# Patient Record
Sex: Female | Born: 1950
Health system: Southern US, Community
[De-identification: ages and names within clinical notes are randomized; demographics above are authoritative.]

## PROBLEM LIST (undated history)

## (undated) DIAGNOSIS — I10 Essential (primary) hypertension: Secondary | ICD-10-CM

## (undated) DIAGNOSIS — C801 Malignant (primary) neoplasm, unspecified: Secondary | ICD-10-CM

## (undated) DIAGNOSIS — C50919 Malignant neoplasm of unspecified site of unspecified female breast: Secondary | ICD-10-CM

## (undated) DIAGNOSIS — R000 Tachycardia, unspecified: Secondary | ICD-10-CM

## (undated) DIAGNOSIS — Z9221 Personal history of antineoplastic chemotherapy: Secondary | ICD-10-CM

## (undated) DIAGNOSIS — I712 Thoracic aortic aneurysm, without rupture, unspecified: Secondary | ICD-10-CM

## (undated) DIAGNOSIS — Z87440 Personal history of urinary (tract) infections: Secondary | ICD-10-CM

## (undated) DIAGNOSIS — Z923 Personal history of irradiation: Secondary | ICD-10-CM

## (undated) DIAGNOSIS — K5790 Diverticulosis of intestine, part unspecified, without perforation or abscess without bleeding: Secondary | ICD-10-CM

## (undated) DIAGNOSIS — F32A Depression, unspecified: Secondary | ICD-10-CM

## (undated) DIAGNOSIS — Z860100 Personal history of colon polyps, unspecified: Secondary | ICD-10-CM

## (undated) DIAGNOSIS — T4145XA Adverse effect of unspecified anesthetic, initial encounter: Secondary | ICD-10-CM

## (undated) DIAGNOSIS — F329 Major depressive disorder, single episode, unspecified: Secondary | ICD-10-CM

## (undated) DIAGNOSIS — M810 Age-related osteoporosis without current pathological fracture: Secondary | ICD-10-CM

## (undated) DIAGNOSIS — I499 Cardiac arrhythmia, unspecified: Secondary | ICD-10-CM

## (undated) DIAGNOSIS — T8859XA Other complications of anesthesia, initial encounter: Secondary | ICD-10-CM

## (undated) DIAGNOSIS — E785 Hyperlipidemia, unspecified: Secondary | ICD-10-CM

## (undated) HISTORY — PX: AUGMENTATION MAMMAPLASTY: SUR837

## (undated) HISTORY — PX: MASTECTOMY: SHX3

## (undated) HISTORY — DX: Malignant neoplasm of unspecified site of unspecified female breast: C50.919

## (undated) HISTORY — PX: OTHER SURGICAL HISTORY: SHX169

## (undated) HISTORY — DX: Malignant (primary) neoplasm, unspecified: C80.1

## (undated) HISTORY — PX: COLONOSCOPY: SHX174

---

## 1991-05-03 DIAGNOSIS — C801 Malignant (primary) neoplasm, unspecified: Secondary | ICD-10-CM

## 1991-05-03 HISTORY — DX: Malignant (primary) neoplasm, unspecified: C80.1

## 1991-12-10 DIAGNOSIS — Z853 Personal history of malignant neoplasm of breast: Secondary | ICD-10-CM | POA: Insufficient documentation

## 1991-12-10 HISTORY — PX: BREAST SURGERY: SHX581

## 1997-09-16 ENCOUNTER — Other Ambulatory Visit: Admission: RE | Admit: 1997-09-16 | Discharge: 1997-09-16 | Payer: Self-pay | Admitting: Gynecology

## 1998-10-28 ENCOUNTER — Other Ambulatory Visit: Admission: RE | Admit: 1998-10-28 | Discharge: 1998-10-28 | Payer: Self-pay | Admitting: Gynecology

## 1999-08-11 ENCOUNTER — Encounter: Admission: RE | Admit: 1999-08-11 | Discharge: 1999-08-11 | Payer: Self-pay | Admitting: Oncology

## 1999-08-11 ENCOUNTER — Encounter: Payer: Self-pay | Admitting: Oncology

## 1999-11-22 ENCOUNTER — Other Ambulatory Visit: Admission: RE | Admit: 1999-11-22 | Discharge: 1999-11-22 | Payer: Self-pay | Admitting: Gynecology

## 2000-10-20 ENCOUNTER — Encounter: Admission: RE | Admit: 2000-10-20 | Discharge: 2000-10-20 | Payer: Self-pay | Admitting: Gynecology

## 2000-10-20 ENCOUNTER — Encounter: Payer: Self-pay | Admitting: Oncology

## 2000-12-05 ENCOUNTER — Other Ambulatory Visit: Admission: RE | Admit: 2000-12-05 | Discharge: 2000-12-05 | Payer: Self-pay | Admitting: Gynecology

## 2001-05-16 ENCOUNTER — Encounter: Admission: RE | Admit: 2001-05-16 | Discharge: 2001-08-14 | Payer: Self-pay | Admitting: *Deleted

## 2001-12-05 ENCOUNTER — Other Ambulatory Visit: Admission: RE | Admit: 2001-12-05 | Discharge: 2001-12-05 | Payer: Self-pay | Admitting: Gynecology

## 2001-12-10 ENCOUNTER — Encounter: Admission: RE | Admit: 2001-12-10 | Discharge: 2001-12-10 | Payer: Self-pay | Admitting: Oncology

## 2001-12-10 ENCOUNTER — Encounter: Payer: Self-pay | Admitting: Oncology

## 2002-12-12 ENCOUNTER — Encounter: Admission: RE | Admit: 2002-12-12 | Discharge: 2002-12-12 | Payer: Self-pay | Admitting: Oncology

## 2002-12-12 ENCOUNTER — Encounter: Payer: Self-pay | Admitting: Oncology

## 2003-03-05 ENCOUNTER — Other Ambulatory Visit: Admission: RE | Admit: 2003-03-05 | Discharge: 2003-03-05 | Payer: Self-pay | Admitting: Gynecology

## 2004-01-26 ENCOUNTER — Encounter: Admission: RE | Admit: 2004-01-26 | Discharge: 2004-01-26 | Payer: Self-pay | Admitting: Oncology

## 2004-08-17 ENCOUNTER — Other Ambulatory Visit: Admission: RE | Admit: 2004-08-17 | Discharge: 2004-08-17 | Payer: Self-pay | Admitting: Gynecology

## 2005-02-18 ENCOUNTER — Encounter: Admission: RE | Admit: 2005-02-18 | Discharge: 2005-02-18 | Payer: Self-pay | Admitting: Oncology

## 2005-10-10 ENCOUNTER — Other Ambulatory Visit: Admission: RE | Admit: 2005-10-10 | Discharge: 2005-10-10 | Payer: Self-pay | Admitting: Gynecology

## 2006-02-24 ENCOUNTER — Encounter: Admission: RE | Admit: 2006-02-24 | Discharge: 2006-02-24 | Payer: Self-pay | Admitting: Oncology

## 2006-11-15 ENCOUNTER — Other Ambulatory Visit: Admission: RE | Admit: 2006-11-15 | Discharge: 2006-11-15 | Payer: Self-pay | Admitting: Gynecology

## 2007-03-01 ENCOUNTER — Encounter: Admission: RE | Admit: 2007-03-01 | Discharge: 2007-03-01 | Payer: Self-pay | Admitting: Oncology

## 2008-03-03 ENCOUNTER — Encounter: Admission: RE | Admit: 2008-03-03 | Discharge: 2008-03-03 | Payer: Self-pay | Admitting: Oncology

## 2008-03-31 ENCOUNTER — Other Ambulatory Visit: Admission: RE | Admit: 2008-03-31 | Discharge: 2008-03-31 | Payer: Self-pay | Admitting: Gynecology

## 2008-09-15 ENCOUNTER — Ambulatory Visit: Payer: Self-pay | Admitting: Oncology

## 2010-05-23 ENCOUNTER — Encounter: Payer: Self-pay | Admitting: Gynecology

## 2010-10-20 ENCOUNTER — Other Ambulatory Visit: Payer: Self-pay | Admitting: Family Medicine

## 2010-10-20 DIAGNOSIS — S2220XA Unspecified fracture of sternum, initial encounter for closed fracture: Secondary | ICD-10-CM

## 2010-10-20 DIAGNOSIS — I712 Thoracic aortic aneurysm, without rupture, unspecified: Secondary | ICD-10-CM

## 2010-12-21 ENCOUNTER — Ambulatory Visit
Admission: RE | Admit: 2010-12-21 | Discharge: 2010-12-21 | Disposition: A | Discharge status: Home / Self Care | Payer: BC Managed Care – PPO | Source: Ambulatory Visit | Attending: Family Medicine | Admitting: Family Medicine

## 2010-12-21 DIAGNOSIS — I712 Thoracic aortic aneurysm, without rupture, unspecified: Secondary | ICD-10-CM

## 2010-12-21 DIAGNOSIS — S2220XA Unspecified fracture of sternum, initial encounter for closed fracture: Secondary | ICD-10-CM

## 2010-12-21 MED ORDER — IOHEXOL 300 MG/ML  SOLN: 75.0000 mL | Freq: Once | INTRAMUSCULAR | Status: AC | PRN

## 2010-12-22 ENCOUNTER — Other Ambulatory Visit: Payer: Self-pay

## 2012-01-09 ENCOUNTER — Other Ambulatory Visit: Payer: Self-pay | Admitting: Gynecology

## 2012-01-09 ENCOUNTER — Ambulatory Visit
Admission: RE | Admit: 2012-01-09 | Discharge: 2012-01-09 | Disposition: A | Discharge status: Home / Self Care | Payer: BC Managed Care – PPO | Source: Ambulatory Visit | Attending: Gynecology | Admitting: Gynecology

## 2012-01-09 DIAGNOSIS — N6453 Retraction of nipple: Secondary | ICD-10-CM

## 2012-01-09 DIAGNOSIS — N649 Disorder of breast, unspecified: Secondary | ICD-10-CM

## 2012-01-27 ENCOUNTER — Encounter (INDEPENDENT_AMBULATORY_CARE_PROVIDER_SITE_OTHER): Payer: Self-pay | Admitting: Surgery

## 2012-01-27 ENCOUNTER — Ambulatory Visit (INDEPENDENT_AMBULATORY_CARE_PROVIDER_SITE_OTHER): Payer: BC Managed Care – PPO | Admitting: Surgery

## 2012-01-27 VITALS — BP 88/62 | HR 64 | Temp 98.6°F | Resp 12 | Ht 67.0 in | Wt 120.8 lb

## 2012-01-27 DIAGNOSIS — N6489 Other specified disorders of breast: Secondary | ICD-10-CM | POA: Insufficient documentation

## 2012-01-27 DIAGNOSIS — C50919 Malignant neoplasm of unspecified site of unspecified female breast: Secondary | ICD-10-CM

## 2012-01-27 DIAGNOSIS — Z853 Personal history of malignant neoplasm of breast: Secondary | ICD-10-CM

## 2012-01-27 NOTE — Patient Instructions (Signed)
I will call you next week when we have a report back from the pathologist

## 2012-01-27 NOTE — Progress Notes (Signed)
NAME: Christina Daniel DOB: 12/22/1950 MRN: [id number]                                                                                      DATE: 01/27/2012  PCP: NNODI, ADAKU, MD Referring Provider: Hu, Jeffrey T, MD  IMPRESSION:  Nipple crusting and scar nodule X3 at site of prior lumpectomy for DCIS/LCIS  PLAN:   I think both areas need Bx with punch. Discussed with patient and she wishes to proceed.  PROCEDURE NOTE: The right nipple area in the more medial of 3 skin nodules are prepped with alcohol and anesthetized with 1% Xylocaine with epinephrine. A 3 mm punch biopsy is taken from the both sites.                 CC:  Chief Complaint  Patient presents with  . Breast Problem    HPI:  Christina Daniel is a 61 y.o.  female who presents for evaluation of a change in the right nipple and some nodules on a prior lumpectomy scar. She thinks the nipple has become somewhat inverted and crusting over the last several months. The nodules she noted about a year ago and they may have enlarged a bit. In 1993 she had a lumpectomy for a DCIS in the right breast. LCIS was also found. She had postoperative radiation. Apparently she did not have any other treatments. I have seen her for some benign problems of the left breast since then but there has been no further evidence of cancer. These areas are not symptomatic.  PMH:  has a past medical history of Cancer (1993).  PSH:   has past surgical history that includes Breast surgery (12/10/1991).  ALLERGIES:   Allergies  Allergen Reactions  . Sulfa Antibiotics     MEDICATIONS: Current outpatient prescriptions:alendronate (FOSAMAX) 70 MG tablet, , Disp: , Rfl: ;  hydrochlorothiazide (HYDRODIURIL) 25 MG tablet, , Disp: , Rfl: ;  HYDROcodone-acetaminophen (VICODIN) 5-500 MG per tablet, , Disp: , Rfl: ;  metoprolol succinate (TOPROL-XL) 25 MG 24 hr tablet, , Disp: , Rfl: ;  promethazine (PHENERGAN) 25 MG tablet, , Disp: , Rfl: ;  simvastatin (ZOCOR) 20  MG tablet, , Disp: , Rfl:   ROS: She has filled out our 12 point review of systems and it is negative . EXAM:   General: Patient alert, NAD VS: BP 88/62  Pulse 64  Temp 98.6 F (37 C) (Temporal)  Resp 12  Ht 5' 7" (1.702 m)  Wt 120 lb 12.8 oz (54.795 kg)  BMI 18.92 kg/m2 Breasts; The right breast shows a flattened nipple with crusting. There are three red raised 2-3mm nodules along the old scar  DATA REVIEWED:  Reviewed old chart.    Xara Paulding J 01/27/2012  CC: Hu, Jeffrey T, MD, NNODI, ADAKU, MD        

## 2012-01-30 ENCOUNTER — Other Ambulatory Visit: Payer: Self-pay | Admitting: Oncology

## 2012-02-01 ENCOUNTER — Telehealth (INDEPENDENT_AMBULATORY_CARE_PROVIDER_SITE_OTHER): Payer: Self-pay | Admitting: General Surgery

## 2012-02-01 ENCOUNTER — Ambulatory Visit (INDEPENDENT_AMBULATORY_CARE_PROVIDER_SITE_OTHER): Payer: BC Managed Care – PPO | Admitting: Surgery

## 2012-02-01 ENCOUNTER — Encounter (INDEPENDENT_AMBULATORY_CARE_PROVIDER_SITE_OTHER): Payer: Self-pay | Admitting: Surgery

## 2012-02-01 ENCOUNTER — Encounter (INDEPENDENT_AMBULATORY_CARE_PROVIDER_SITE_OTHER): Payer: Self-pay

## 2012-02-01 VITALS — BP 144/100 | HR 80 | Temp 98.5°F | Resp 16 | Ht 67.0 in | Wt 122.2 lb

## 2012-02-01 DIAGNOSIS — C50911 Malignant neoplasm of unspecified site of right female breast: Secondary | ICD-10-CM

## 2012-02-01 DIAGNOSIS — C50919 Malignant neoplasm of unspecified site of unspecified female breast: Secondary | ICD-10-CM

## 2012-02-01 NOTE — Telephone Encounter (Signed)
Spoke with pt and explained that we referred her to Dr. Shella Spearing.  I gave her the address and the appt information again.  I explained to her that Dr. Kelly Splinter will be able to go over more information about the reconstruction possibilities if she decides this is what she would like to do.. I also informed her that We will give her a call back once we get her MRI scheduled.

## 2012-02-01 NOTE — Telephone Encounter (Signed)
Message copied by Littie Deeds on Wed Feb 01, 2012  5:30 PM ------      Message from: Zacarias Pontes      Created: Wed Feb 01, 2012  3:28 PM       Pt was in today and referred to another doc?? She doesn't remember who she is suppose to see,please give her a call back at [phone]
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
## 2012-02-01 NOTE — Patient Instructions (Signed)
We will arrange an MRI scan of your breast, an appointment with a genetic counselor, and an appointment with a plastic surgeon. I will check with Dr Darnelle Catalan about doing some other scans

## 2012-02-06 ENCOUNTER — Telehealth (INDEPENDENT_AMBULATORY_CARE_PROVIDER_SITE_OTHER): Payer: Self-pay | Admitting: General Surgery

## 2012-02-06 NOTE — Telephone Encounter (Signed)
Returned patient's call. Left message on machine for patient to call back and ask for me.

## 2012-02-06 NOTE — Telephone Encounter (Signed)
Message copied by Liliana Cline on Mon Feb 06, 2012 12:47 PM ------      Message from: Marchia Bond      Created: Mon Feb 06, 2012 10:50 AM      Regarding: PT MRI      Contact: [phone]       DR [facility] PT CALLED AND WOULD LIKE TO MOVE HER MRI TO Monday IF ITS OK WITH DR Jamey Ripa CALL PT BACK AT [phone]

## 2012-02-06 NOTE — Telephone Encounter (Signed)
Spoke to patient. She wanted to know if okay to wait until next Monday 02/13/12. I advised if that works better with her schedule it is okay to wait the few extra days. She will call to reschedule.

## 2012-02-07 ENCOUNTER — Telehealth: Payer: Self-pay | Admitting: *Deleted

## 2012-02-07 NOTE — Telephone Encounter (Signed)
Patient returned my call and I tried to give her a genetic appt of 10/10 and she cannot do that date.  Told patient that my next available rt now is Nov. 25th and that just wont do.  Gave information to Tami to help me with this.

## 2012-02-07 NOTE — Telephone Encounter (Signed)
Left message for pt to return my call so I can schedule a genetic appt.  

## 2012-02-08 ENCOUNTER — Other Ambulatory Visit: Payer: BC Managed Care – PPO

## 2012-02-08 ENCOUNTER — Telehealth: Payer: Self-pay | Admitting: *Deleted

## 2012-02-08 NOTE — Telephone Encounter (Signed)
Patient called stating that she would like to go ahead and schedule her genetic appt for Nov.  Confirmed 03/26/12 genetic appt w/ pt.  Emailed Jade to make aware.

## 2012-02-09 ENCOUNTER — Other Ambulatory Visit: Payer: Self-pay | Admitting: *Deleted

## 2012-02-09 ENCOUNTER — Telehealth: Payer: Self-pay | Admitting: *Deleted

## 2012-02-09 ENCOUNTER — Telehealth: Payer: Self-pay | Admitting: Oncology

## 2012-02-09 DIAGNOSIS — C50919 Malignant neoplasm of unspecified site of unspecified female breast: Secondary | ICD-10-CM

## 2012-02-09 NOTE — Telephone Encounter (Signed)
lmonvm adviisng the pt of her petscan/ct scan appt with instructions

## 2012-02-09 NOTE — Telephone Encounter (Signed)
Pt returned my call and I confirmed 02/20/12 appt w/ her.  Mailed before appt letter & packet to pt.  Emailed Jade & Dr. Jamey Ripa at [facility] to make them aware of appt.  Took paperwork to Med Rec for chart.

## 2012-02-10 ENCOUNTER — Telehealth (INDEPENDENT_AMBULATORY_CARE_PROVIDER_SITE_OTHER): Payer: Self-pay

## 2012-02-10 ENCOUNTER — Other Ambulatory Visit (INDEPENDENT_AMBULATORY_CARE_PROVIDER_SITE_OTHER): Payer: Self-pay | Admitting: Surgery

## 2012-02-10 DIAGNOSIS — C50911 Malignant neoplasm of unspecified site of right female breast: Secondary | ICD-10-CM

## 2012-02-10 NOTE — Telephone Encounter (Signed)
The patient called to discuss her appointments for next week.  She has a mri of the breasts Monday and ct/pet Friday per Dr Darnelle Catalan.  She is unclear why she needs both.  I told her the mri is of the breasts only and the ct/ pet appears to be of the body.  She said Dr Darnelle Catalan said the ct was of the chest.  I told her she may need to clarify that with his office.  She wants to know why she can't just have a mri of the body so she can do just one scan.  I told her I will need to forward this to Dr Tenna Child nurse to discuss.

## 2012-02-10 NOTE — Telephone Encounter (Signed)
LMOM for Marianne Sofia, RN with cancer center to call me re: this.

## 2012-02-13 ENCOUNTER — Ambulatory Visit
Admission: RE | Admit: 2012-02-13 | Discharge: 2012-02-13 | Disposition: A | Discharge status: Home / Self Care | Payer: BC Managed Care – PPO | Source: Ambulatory Visit | Attending: Surgery | Admitting: Surgery

## 2012-02-13 DIAGNOSIS — C50919 Malignant neoplasm of unspecified site of unspecified female breast: Secondary | ICD-10-CM

## 2012-02-13 MED ORDER — GADOBENATE DIMEGLUMINE 529 MG/ML IV SOLN
11.0000 mL | Freq: Once | INTRAVENOUS | Status: AC | PRN
  Administered 2012-02-13: 11 mL via INTRAVENOUS

## 2012-02-14 ENCOUNTER — Telehealth (INDEPENDENT_AMBULATORY_CARE_PROVIDER_SITE_OTHER): Payer: Self-pay | Admitting: Surgery

## 2012-02-14 NOTE — Telephone Encounter (Signed)
Reviewed her MRI report and discussed plans. PET and oncology visit both pending

## 2012-02-16 ENCOUNTER — Encounter (HOSPITAL_COMMUNITY): Payer: Self-pay | Admitting: Pharmacy Technician

## 2012-02-17 ENCOUNTER — Other Ambulatory Visit: Payer: Self-pay | Admitting: *Deleted

## 2012-02-17 ENCOUNTER — Encounter (HOSPITAL_COMMUNITY)
Admission: RE | Admit: 2012-02-17 | Discharge: 2012-02-17 | Disposition: A | Discharge status: Home / Self Care | Payer: BC Managed Care – PPO | Source: Ambulatory Visit | Attending: Surgery | Admitting: Surgery

## 2012-02-17 ENCOUNTER — Ambulatory Visit (HOSPITAL_COMMUNITY)
Admission: RE | Admit: 2012-02-17 | Discharge: 2012-02-17 | Disposition: A | Discharge status: Home / Self Care | Payer: BC Managed Care – PPO | Source: Ambulatory Visit | Attending: Oncology | Admitting: Oncology

## 2012-02-17 ENCOUNTER — Encounter (HOSPITAL_COMMUNITY): Payer: Self-pay

## 2012-02-17 DIAGNOSIS — Z01818 Encounter for other preprocedural examination: Secondary | ICD-10-CM | POA: Insufficient documentation

## 2012-02-17 DIAGNOSIS — Z01812 Encounter for preprocedural laboratory examination: Secondary | ICD-10-CM | POA: Insufficient documentation

## 2012-02-17 DIAGNOSIS — Z1289 Encounter for screening for malignant neoplasm of other sites: Secondary | ICD-10-CM | POA: Insufficient documentation

## 2012-02-17 DIAGNOSIS — C50919 Malignant neoplasm of unspecified site of unspecified female breast: Secondary | ICD-10-CM

## 2012-02-17 DIAGNOSIS — I10 Essential (primary) hypertension: Secondary | ICD-10-CM | POA: Insufficient documentation

## 2012-02-17 DIAGNOSIS — Z01810 Encounter for preprocedural cardiovascular examination: Secondary | ICD-10-CM | POA: Insufficient documentation

## 2012-02-17 HISTORY — DX: Diverticulosis of intestine, part unspecified, without perforation or abscess without bleeding: K57.90

## 2012-02-17 HISTORY — DX: Tachycardia, unspecified: R00.0

## 2012-02-17 HISTORY — DX: Personal history of colonic polyps: Z86.010

## 2012-02-17 HISTORY — DX: Personal history of urinary (tract) infections: Z87.440

## 2012-02-17 HISTORY — DX: Major depressive disorder, single episode, unspecified: F32.9

## 2012-02-17 HISTORY — DX: Essential (primary) hypertension: I10

## 2012-02-17 HISTORY — DX: Adverse effect of unspecified anesthetic, initial encounter: T41.45XA

## 2012-02-17 HISTORY — DX: Personal history of colon polyps, unspecified: Z86.0100

## 2012-02-17 HISTORY — DX: Hyperlipidemia, unspecified: E78.5

## 2012-02-17 HISTORY — DX: Age-related osteoporosis without current pathological fracture: M81.0

## 2012-02-17 HISTORY — DX: Thoracic aortic aneurysm, without rupture: I71.2

## 2012-02-17 HISTORY — DX: Depression, unspecified: F32.A

## 2012-02-17 HISTORY — DX: Cardiac arrhythmia, unspecified: I49.9

## 2012-02-17 HISTORY — DX: Thoracic aortic aneurysm, without rupture, unspecified: I71.20

## 2012-02-17 HISTORY — DX: Other complications of anesthesia, initial encounter: T88.59XA

## 2012-02-17 LAB — BASIC METABOLIC PANEL WITH GFR
BUN: 20 mg/dL (ref 6–23)
CO2: 29 meq/L (ref 19–32)
Calcium: 9.2 mg/dL (ref 8.4–10.5)
Chloride: 104 meq/L (ref 96–112)
Creatinine, Ser: 0.52 mg/dL (ref 0.50–1.10)
GFR calc Af Amer: >90 mL/min
GFR calc non Af Amer: >90 mL/min
Glucose, Bld: 91 mg/dL (ref 70–99)
Potassium: 3.5 meq/L (ref 3.5–5.1)
Sodium: 143 meq/L (ref 135–145)

## 2012-02-17 LAB — CBC
HCT: 41.6 % (ref 36.0–46.0)
Hemoglobin: 14 g/dL (ref 12.0–15.0)
MCH: 31.3 pg (ref 26.0–34.0)
MCHC: 33.7 g/dL (ref 30.0–36.0)
MCV: 92.9 fL (ref 78.0–100.0)
Platelets: 265 10*3/uL (ref 150–400)
RBC: 4.48 MIL/uL (ref 3.87–5.11)
RDW: 13.2 % (ref 11.5–15.5)
WBC: 6.6 10*3/uL (ref 4.0–10.5)

## 2012-02-17 LAB — SURGICAL PCR SCREEN: MRSA, PCR: NEGATIVE

## 2012-02-17 MED ORDER — FLUDEOXYGLUCOSE F - 18 (FDG) INJECTION
19.5000 | Freq: Once | INTRAVENOUS | Status: AC | PRN
  Administered 2012-02-17: 19.5 via INTRAVENOUS

## 2012-02-17 MED ORDER — CHLORHEXIDINE GLUCONATE 4 % EX LIQD: 1.0000 "application " | Freq: Once | CUTANEOUS | Status: DC

## 2012-02-17 MED ORDER — IOHEXOL 300 MG/ML  SOLN
80.0000 mL | Freq: Once | INTRAMUSCULAR | Status: AC | PRN
  Administered 2012-02-17: 80 mL via INTRAVENOUS

## 2012-02-17 NOTE — Pre-Procedure Instructions (Signed)
20 Christina Daniel  02/17/2012   Your procedure is scheduled on:  Wed, Oct 23 @ 9:00 AM  Report to [facility] at 7:00 AM.  Call this number if you have problems the morning of surgery: [phone]   Remember:   Do not eat food:After Midnight.    Take these medicines the morning of surgery with A SIP OF WATER: Metoprolol(Toprol)   Do not wear jewelry, make-up or nail polish.  Do not wear lotions, powders, or perfumes. You may wear deodorant.  Do not shave 48 hours prior to surgery.   Do not bring valuables to the hospital.  Contacts, dentures or bridgework may not be worn into surgery.  Leave suitcase in the car. After surgery it may be brought to your room.  For patients admitted to the hospital, checkout time is 11:00 AM the day of discharge.   Patients discharged the day of surgery will not be allowed to drive home.  Special Instructions: Shower using CHG 2 nights before surgery and the night before surgery.  If you shower the day of surgery use CHG.  Use special wash - you have one bottle of CHG for all showers.  You should use approximately 1/3 of the bottle for each shower.   Please read over the following fact sheets that you were given: Pain Booklet, Coughing and Deep Breathing, MRSA Information and Surgical Site Infection Prevention
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
## 2012-02-20 ENCOUNTER — Ambulatory Visit: Payer: BC Managed Care – PPO

## 2012-02-20 ENCOUNTER — Ambulatory Visit (HOSPITAL_BASED_OUTPATIENT_CLINIC_OR_DEPARTMENT_OTHER): Payer: BC Managed Care – PPO | Admitting: Oncology

## 2012-02-20 ENCOUNTER — Telehealth (INDEPENDENT_AMBULATORY_CARE_PROVIDER_SITE_OTHER): Payer: Self-pay

## 2012-02-20 ENCOUNTER — Other Ambulatory Visit: Payer: BC Managed Care – PPO | Admitting: Lab

## 2012-02-20 ENCOUNTER — Encounter: Payer: Self-pay | Admitting: Oncology

## 2012-02-20 ENCOUNTER — Other Ambulatory Visit: Payer: Self-pay | Admitting: Plastic Surgery

## 2012-02-20 VITALS — BP 142/96 | HR 72 | Temp 98.7°F | Resp 20 | Ht 67.0 in | Wt 122.9 lb

## 2012-02-20 DIAGNOSIS — C50519 Malignant neoplasm of lower-outer quadrant of unspecified female breast: Secondary | ICD-10-CM

## 2012-02-20 DIAGNOSIS — C801 Malignant (primary) neoplasm, unspecified: Secondary | ICD-10-CM | POA: Insufficient documentation

## 2012-02-20 DIAGNOSIS — C50911 Malignant neoplasm of unspecified site of right female breast: Secondary | ICD-10-CM

## 2012-02-20 DIAGNOSIS — C50119 Malignant neoplasm of central portion of unspecified female breast: Secondary | ICD-10-CM

## 2012-02-20 DIAGNOSIS — Z170 Estrogen receptor positive status [ER+]: Secondary | ICD-10-CM

## 2012-02-20 DIAGNOSIS — C50919 Malignant neoplasm of unspecified site of unspecified female breast: Secondary | ICD-10-CM

## 2012-02-20 DIAGNOSIS — Z87898 Personal history of other specified conditions: Secondary | ICD-10-CM

## 2012-02-20 NOTE — Progress Notes (Signed)
Checked in new pt with no financial concerns. °
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
## 2012-02-20 NOTE — Telephone Encounter (Signed)
Left message on machine for patient to call back and ask for me. Per Dr Jamey Ripa he does not need cardiac clearance on patient.
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
## 2012-02-20 NOTE — H&P (Signed)
This document contains confidential information from a [facility] medical record system and may be unauthenticated. Release may be made only with a valid authorization or in accordance with applicable policies of Medical Center or its affiliates. This document must be maintained in a secure manner or discarded/destroyed as required by Medical Center policy or by a confidential means such as shredding.   Christina Daniel   02/07/2012 8:00 AM Office Visit  MRN: [id number]  Department: [facility]  Dept Phone: [phone]  Description: Female DOB: [birth year]  Provider: Wayland Denis, DO    Diagnoses    Breast cancer   - Primary     Reason for Visit  -  Breast Reconstruction     Vitals - Last Recorded      127/89  1.702 m (5\' 7" )  54.885 kg (121 lb)  18.95 kg/m2       Subjective:    Patient ID: Christina Daniel is a 61 y.o. female.  HPI Christina Daniel is a 61 yrs old female here for evaluation for breast reconstruction.  She noted a change in the right nipple and some nodules at the site of a prior lumpectomy scar. Dr. Jamey Ripa did a biopsy (01/27/12) which showed invasive ductal carcinoma.  The nipple has become somewhat inverted and crusting over the last several months. The nodules were noted a year ago and have enlarged. A right breast lumpectomy was done in 1993 for DCIS.  At the same time LCIS was also found.  Postoperative radiation was performed but no chemotherapy.  She is planning on a right mastectomy now.  She is scheduled for an MRI next week. She wears a 34B bra, she is 5 feet 7 inches tall and weighs 121 pounds. No radiation is planned.  She has hypertension and hyperlipidemia.  She is otherwise in good health.  The following portions of the patient's history were reviewed and updated as appropriate: allergies, current medications, past family history, past medical history, past social history, past surgical history and problem list.  Review of Systems    Constitutional: Negative.   HENT: Negative.   Eyes: Negative.   Respiratory: Negative.   Cardiovascular: Negative.   Gastrointestinal: Negative.   Genitourinary: Negative.   Musculoskeletal: Negative.   Hematological: Negative.   Psychiatric/Behavioral: Negative.       Objective:    Physical Exam  Constitutional: She appears well-developed and well-nourished.  HENT:   Head: Normocephalic and atraumatic.  Eyes: Conjunctivae normal are normal. Pupils are equal, round, and reactive to light.  Neck: Normal range of motion.  Cardiovascular: Normal rate.   Pulmonary/Chest: Effort normal.  Abdominal: Soft. She exhibits no distension. There is no tenderness.  Neurological: She is alert.  Skin: Skin is warm.  Psychiatric: She has a normal mood and affect. Her behavior is normal. Judgment and thought content normal.     Assessment:     1.  Breast cancer        Plan:     Assessment and Plan:   A long, detailed conversation was had regarding the patient's options for breast reconstruction. Five main points, which are explained to all breast reconstruction patients, were discussed.   1. Breast reconstruction is an optional process.   2. Breast reconstruction is a multi-stage process which involves multiple surgeries spaced several months apart. The entire process can take over one year.   3. The major goal of breast reconstruction is to have the patient look normal  in clothing. When naked, there will always be scars.   4. Asymmetries are often present during the reconstruction process. Several operations may be needed, including surgery to the non-cancerous breast, to achieve satisfactory results.   5. No matter the reconstructive method, there are ways that the reconstruction can fail and a secondary reconstructive plan would need to be created.   A general discussion regarding all available methods of breast reconstruction were discussed. The types of reconstructions described included.    1. Tissue expander and implant based reconstruction, both single and multi-stage approaches.   2. Autologous only reconstructions, including free abdominal-tissue based reconstructions.   3. Combination procedures, particularly latissismus dorsi flaps combined with either expanders or implants.   For each of the reconstruction methods mentioned above, the risks, benefits, alternatives, scarring, and recovery time were discussed in great detail. Specific risks detailed included bleeding, infection, hematoma, seroma, scarring, pain, wound healing complications, flap loss, fat necrosis, capsular contracture, need for implant removal, donor site complications, bulge, hernia, umbilical necrosis, need for urgent reoperation, and need for dressing changes were discussed.   Assessment   Once all reconstruction options were presented, a focused discussion was had regarding the patient's suitability for each of these procedures.   A total of 50 minutes of face-to-face time was spent in this encounter, of which >50% was spent in counseling. The patient will need a right latissimus musculocutaneous flap with expander placement after immediate mastectomy.

## 2012-02-21 ENCOUNTER — Telehealth: Payer: Self-pay | Admitting: *Deleted

## 2012-02-21 ENCOUNTER — Encounter (INDEPENDENT_AMBULATORY_CARE_PROVIDER_SITE_OTHER): Payer: Self-pay

## 2012-02-21 MED ORDER — CEFAZOLIN SODIUM-DEXTROSE 2-3 GM-% IV SOLR
2.0000 g | INTRAVENOUS | Status: AC
  Administered 2012-02-22: 2 g via INTRAVENOUS
  Filled 2012-02-21: qty 50

## 2012-02-21 NOTE — Telephone Encounter (Signed)
Per staff message and POF I have scheduled appts.  [name]  

## 2012-02-21 NOTE — Telephone Encounter (Signed)
Patient called back and I made her aware she does not need a cardiac clearance. She was also asking about how many days she would be in the hospital and why she had to have a sentinel lymph node removed. I tried to explain this to her and I believe she understood that with invasive cancers we always test a lymph node to see if there is metastasis. She also had questions re: medications the day of surgery and I encouraged her to discuss this with the pre-admission nurses. She also wanted to ask Dr Jamey Ripa if it was okay to take a [place] herb for healing after her surgery. I told her I would send the message to him to ask, but I also encouraged her to talk with him when she sees him the the hospital prior to surgery. All questions were answered and she will call back with any others prior to surgery.

## 2012-02-21 NOTE — Telephone Encounter (Signed)
03-16-2012 2:15pm   03-21-2012 lab and treatment  03-22-2012 injection  03-26-2012 genetics counseling   04-04-2012 lab and midlevel   Sent michelle email to set up patient treatment   Patient confirmed over the phone the appointments

## 2012-02-22 ENCOUNTER — Encounter (HOSPITAL_COMMUNITY): Payer: Self-pay | Admitting: *Deleted

## 2012-02-22 ENCOUNTER — Encounter (HOSPITAL_COMMUNITY): Payer: Self-pay | Admitting: Anesthesiology

## 2012-02-22 ENCOUNTER — Ambulatory Visit (HOSPITAL_COMMUNITY): Payer: BC Managed Care – PPO | Admitting: Anesthesiology

## 2012-02-22 ENCOUNTER — Encounter (HOSPITAL_COMMUNITY)
Admission: RE | Disposition: A | Discharge status: Home / Self Care | Payer: Self-pay | Source: Ambulatory Visit | Attending: Surgery

## 2012-02-22 ENCOUNTER — Encounter (HOSPITAL_COMMUNITY): Payer: Self-pay | Admitting: Plastic Surgery

## 2012-02-22 ENCOUNTER — Inpatient Hospital Stay (HOSPITAL_COMMUNITY)
Admission: RE | Admit: 2012-02-22 | Discharge: 2012-02-24 | DRG: 257 | Disposition: A | Discharge status: Home / Self Care | Payer: BC Managed Care – PPO | Source: Ambulatory Visit | Attending: Surgery | Admitting: Surgery

## 2012-02-22 ENCOUNTER — Encounter (HOSPITAL_COMMUNITY)
Admission: RE | Admit: 2012-02-22 | Discharge: 2012-02-22 | Disposition: A | Discharge status: Home / Self Care | Payer: BC Managed Care – PPO | Source: Ambulatory Visit | Attending: Surgery | Admitting: Surgery

## 2012-02-22 DIAGNOSIS — C773 Secondary and unspecified malignant neoplasm of axilla and upper limb lymph nodes: Secondary | ICD-10-CM | POA: Diagnosis present

## 2012-02-22 DIAGNOSIS — I10 Essential (primary) hypertension: Secondary | ICD-10-CM | POA: Diagnosis present

## 2012-02-22 DIAGNOSIS — C50919 Malignant neoplasm of unspecified site of unspecified female breast: Secondary | ICD-10-CM

## 2012-02-22 DIAGNOSIS — D0590 Unspecified type of carcinoma in situ of unspecified breast: Secondary | ICD-10-CM

## 2012-02-22 DIAGNOSIS — E785 Hyperlipidemia, unspecified: Secondary | ICD-10-CM | POA: Diagnosis present

## 2012-02-22 DIAGNOSIS — C50911 Malignant neoplasm of unspecified site of right female breast: Secondary | ICD-10-CM

## 2012-02-22 HISTORY — PX: LATISSIMUS FLAP TO BREAST: SHX5357

## 2012-02-22 HISTORY — PX: TISSUE EXPANDER PLACEMENT: SHX2530

## 2012-02-22 HISTORY — PX: MODIFIED MASTECTOMY: SHX5268

## 2012-02-22 LAB — BASIC METABOLIC PANEL
BUN: 11 mg/dL (ref 6–23)
CO2: 24 mEq/L (ref 19–32)
Chloride: 104 mEq/L (ref 96–112)
Glucose, Bld: 129 mg/dL — ABNORMAL HIGH (ref 70–99)
Potassium: 3.8 mEq/L (ref 3.5–5.1)

## 2012-02-22 LAB — CBC
HCT: 34.4 % — ABNORMAL LOW (ref 36.0–46.0)
Hemoglobin: 11.8 g/dL — ABNORMAL LOW (ref 12.0–15.0)
MCH: 31 pg (ref 26.0–34.0)
MCHC: 34.3 g/dL (ref 30.0–36.0)

## 2012-02-22 SURGERY — MODIFIED MASTECTOMY
Anesthesia: General | Site: Flank | Laterality: Right | Wound class: Clean

## 2012-02-22 MED ORDER — ROCURONIUM BROMIDE 100 MG/10ML IV SOLN
INTRAVENOUS | Status: DC | PRN
  Administered 2012-02-22: 50 mg via INTRAVENOUS
  Administered 2012-02-22: 25 mg via INTRAVENOUS

## 2012-02-22 MED ORDER — ONDANSETRON HCL 4 MG/2ML IJ SOLN
4.0000 mg | Freq: Four times a day (QID) | INTRAMUSCULAR | Status: DC | PRN
  Administered 2012-02-22 – 2012-02-23 (×2): 4 mg via INTRAVENOUS
  Filled 2012-02-22 (×4): qty 2

## 2012-02-22 MED ORDER — MORPHINE SULFATE 2 MG/ML IJ SOLN
2.0000 mg | INTRAMUSCULAR | Status: DC | PRN
  Administered 2012-02-22 – 2012-02-23 (×2): 2 mg via INTRAVENOUS
  Filled 2012-02-22 (×2): qty 1

## 2012-02-22 MED ORDER — PROPOFOL 10 MG/ML IV BOLUS
INTRAVENOUS | Status: DC | PRN
  Administered 2012-02-22: 200 mg via INTRAVENOUS

## 2012-02-22 MED ORDER — PHENYLEPHRINE HCL 10 MG/ML IJ SOLN
10.0000 mg | INTRAVENOUS | Status: DC | PRN
  Administered 2012-02-22: 50 ug/min via INTRAVENOUS

## 2012-02-22 MED ORDER — FENTANYL CITRATE 0.05 MG/ML IJ SOLN
INTRAMUSCULAR | Status: DC | PRN
  Administered 2012-02-22 (×2): 25 ug via INTRAVENOUS
  Administered 2012-02-22: 50 ug via INTRAVENOUS
  Administered 2012-02-22: 100 ug via INTRAVENOUS
  Administered 2012-02-22: 50 ug via INTRAVENOUS

## 2012-02-22 MED ORDER — NEOSTIGMINE METHYLSULFATE 1 MG/ML IJ SOLN
INTRAMUSCULAR | Status: DC | PRN
  Administered 2012-02-22: 3 mg via INTRAVENOUS

## 2012-02-22 MED ORDER — HYDROMORPHONE HCL PF 1 MG/ML IJ SOLN
INTRAMUSCULAR | Status: AC
  Filled 2012-02-22: qty 1

## 2012-02-22 MED ORDER — ACETAMINOPHEN 650 MG RE SUPP: 650.0000 mg | Freq: Four times a day (QID) | RECTAL | Status: DC | PRN

## 2012-02-22 MED ORDER — ACETAMINOPHEN 325 MG PO TABS: 650.0000 mg | ORAL_TABLET | Freq: Four times a day (QID) | ORAL | Status: DC | PRN

## 2012-02-22 MED ORDER — DOCUSATE SODIUM 100 MG PO CAPS
100.0000 mg | ORAL_CAPSULE | Freq: Three times a day (TID) | ORAL | Status: DC
  Administered 2012-02-22 – 2012-02-24 (×6): 100 mg via ORAL
  Filled 2012-02-22 (×6): qty 1

## 2012-02-22 MED ORDER — ALENDRONATE SODIUM 70 MG PO TABS: 70.0000 mg | ORAL_TABLET | ORAL | Status: DC

## 2012-02-22 MED ORDER — MIDAZOLAM HCL 5 MG/5ML IJ SOLN
INTRAMUSCULAR | Status: DC | PRN
  Administered 2012-02-22: 2 mg via INTRAVENOUS

## 2012-02-22 MED ORDER — SENNOSIDES-DOCUSATE SODIUM 8.6-50 MG PO TABS: 1.0000 | ORAL_TABLET | Freq: Every evening | ORAL | Status: DC | PRN

## 2012-02-22 MED ORDER — ONDANSETRON HCL 4 MG/2ML IJ SOLN
INTRAMUSCULAR | Status: DC | PRN
  Administered 2012-02-22 (×2): 4 mg via INTRAVENOUS

## 2012-02-22 MED ORDER — FLEET ENEMA 7-19 GM/118ML RE ENEM
1.0000 | ENEMA | Freq: Once | RECTAL | Status: AC | PRN
  Filled 2012-02-22: qty 1

## 2012-02-22 MED ORDER — METOPROLOL SUCCINATE ER 25 MG PO TB24
25.0000 mg | ORAL_TABLET | Freq: Every day | ORAL | Status: DC
  Administered 2012-02-23 – 2012-02-24 (×2): 25 mg via ORAL
  Filled 2012-02-22 (×2): qty 1

## 2012-02-22 MED ORDER — AMPICILLIN-SULBACTAM SODIUM 3 (2-1) G IJ SOLR
3.0000 g | Freq: Four times a day (QID) | INTRAMUSCULAR | Status: DC
  Administered 2012-02-22 – 2012-02-24 (×8): 3 g via INTRAVENOUS
  Filled 2012-02-22 (×11): qty 3

## 2012-02-22 MED ORDER — GLYCOPYRROLATE 0.2 MG/ML IJ SOLN
INTRAMUSCULAR | Status: DC | PRN
  Administered 2012-02-22: 0.2 mg via INTRAVENOUS
  Administered 2012-02-22: 0.4 mg via INTRAVENOUS

## 2012-02-22 MED ORDER — TECHNETIUM TC 99M SULFUR COLLOID FILTERED
1.0000 | Freq: Once | INTRAVENOUS | Status: AC | PRN
  Administered 2012-02-22: 1 via INTRADERMAL

## 2012-02-22 MED ORDER — METHYLENE BLUE 1 % INJ SOLN
INTRAMUSCULAR | Status: AC
  Filled 2012-02-22: qty 10

## 2012-02-22 MED ORDER — KCL IN DEXTROSE-NACL 20-5-0.45 MEQ/L-%-% IV SOLN
INTRAVENOUS | Status: DC
  Administered 2012-02-22 – 2012-02-23 (×2): via INTRAVENOUS
  Filled 2012-02-22 (×3): qty 1000

## 2012-02-22 MED ORDER — LIDOCAINE HCL (CARDIAC) 20 MG/ML IV SOLN
INTRAVENOUS | Status: DC | PRN
  Administered 2012-02-22: 100 mg via INTRAVENOUS

## 2012-02-22 MED ORDER — DROPERIDOL 2.5 MG/ML IJ SOLN
0.6250 mg | INTRAMUSCULAR | Status: DC
  Filled 2012-02-22: qty 1

## 2012-02-22 MED ORDER — ACETAMINOPHEN 10 MG/ML IV SOLN
INTRAVENOUS | Status: AC
  Filled 2012-02-22: qty 100

## 2012-02-22 MED ORDER — EVICEL 5 ML EX KIT
PACK | CUTANEOUS | Status: DC | PRN
  Administered 2012-02-22 (×2): 5 mL

## 2012-02-22 MED ORDER — HYDROCHLOROTHIAZIDE 25 MG PO TABS: 12.5000 mg | ORAL_TABLET | Freq: Every day | ORAL | Status: DC

## 2012-02-22 MED ORDER — HYDROCHLOROTHIAZIDE 12.5 MG PO CAPS
12.5000 mg | ORAL_CAPSULE | Freq: Every day | ORAL | Status: DC
  Administered 2012-02-22: 12.5 mg via ORAL
  Filled 2012-02-22 (×3): qty 1

## 2012-02-22 MED ORDER — BISACODYL 5 MG PO TBEC: 5.0000 mg | DELAYED_RELEASE_TABLET | Freq: Every day | ORAL | Status: DC | PRN

## 2012-02-22 MED ORDER — HYDROMORPHONE HCL PF 1 MG/ML IJ SOLN
0.2500 mg | INTRAMUSCULAR | Status: DC | PRN
  Administered 2012-02-22 (×2): 0.5 mg via INTRAVENOUS

## 2012-02-22 MED ORDER — SIMVASTATIN 20 MG PO TABS
20.0000 mg | ORAL_TABLET | Freq: Every day | ORAL | Status: DC
  Administered 2012-02-22 – 2012-02-23 (×2): 20 mg via ORAL
  Filled 2012-02-22 (×3): qty 1

## 2012-02-22 MED ORDER — PANTOPRAZOLE SODIUM 40 MG IV SOLR
40.0000 mg | Freq: Every day | INTRAVENOUS | Status: DC
  Administered 2012-02-22: 40 mg via INTRAVENOUS
  Filled 2012-02-22 (×2): qty 40

## 2012-02-22 MED ORDER — EVICEL 5 ML EX KIT
PACK | CUTANEOUS | Status: AC
  Filled 2012-02-22: qty 2

## 2012-02-22 MED ORDER — SODIUM CHLORIDE 0.9 % IR SOLN
Status: DC | PRN
  Administered 2012-02-22: 10:00:00

## 2012-02-22 MED ORDER — PHENYLEPHRINE HCL 10 MG/ML IJ SOLN
INTRAMUSCULAR | Status: DC | PRN
  Administered 2012-02-22: 120 ug via INTRAVENOUS

## 2012-02-22 MED ORDER — CALCIUM 75 MG PO TABS: 75.0000 mg | ORAL_TABLET | ORAL | Status: DC

## 2012-02-22 MED ORDER — LIDOCAINE HCL 4 % MT SOLN
OROMUCOSAL | Status: DC | PRN
  Administered 2012-02-22: 4 mL via TOPICAL

## 2012-02-22 MED ORDER — ARTIFICIAL TEARS OP OINT
TOPICAL_OINTMENT | OPHTHALMIC | Status: DC | PRN
  Administered 2012-02-22: 1 via OPHTHALMIC

## 2012-02-22 MED ORDER — CALCIUM CARBONATE 1250 (500 CA) MG PO TABS
1.0000 | ORAL_TABLET | Freq: Every day | ORAL | Status: DC
  Administered 2012-02-23 – 2012-02-24 (×2): 500 mg via ORAL
  Filled 2012-02-22 (×3): qty 1

## 2012-02-22 MED ORDER — BUPIVACAINE HCL (PF) 0.25 % IJ SOLN
INTRAMUSCULAR | Status: AC
  Filled 2012-02-22: qty 30

## 2012-02-22 MED ORDER — HYDROCODONE-ACETAMINOPHEN 5-325 MG PO TABS
1.0000 | ORAL_TABLET | ORAL | Status: DC | PRN
  Administered 2012-02-22: 1 via ORAL
  Administered 2012-02-23: 2 via ORAL
  Administered 2012-02-23: 1 via ORAL
  Administered 2012-02-23: 2 via ORAL
  Administered 2012-02-23: 1 via ORAL
  Administered 2012-02-24 (×4): 2 via ORAL
  Filled 2012-02-22 (×6): qty 2
  Filled 2012-02-22: qty 1
  Filled 2012-02-22 (×2): qty 2

## 2012-02-22 MED ORDER — LACTATED RINGERS IV SOLN
INTRAVENOUS | Status: DC | PRN
  Administered 2012-02-22 (×5): via INTRAVENOUS

## 2012-02-22 MED ORDER — ONDANSETRON HCL 4 MG/2ML IJ SOLN: 4.0000 mg | Freq: Once | INTRAMUSCULAR | Status: DC | PRN

## 2012-02-22 MED ORDER — SODIUM CHLORIDE 0.9 % IJ SOLN
INTRAMUSCULAR | Status: DC | PRN
  Administered 2012-02-22: 09:00:00 via INTRAMUSCULAR

## 2012-02-22 MED ORDER — DROPERIDOL 2.5 MG/ML IJ SOLN
INTRAMUSCULAR | Status: DC | PRN
  Administered 2012-02-22: 0.625 mg via INTRAVENOUS

## 2012-02-22 MED ORDER — ACETAMINOPHEN 10 MG/ML IV SOLN
INTRAVENOUS | Status: DC | PRN
  Administered 2012-02-22: 1000 mg via INTRAVENOUS

## 2012-02-22 MED ORDER — 0.9 % SODIUM CHLORIDE (POUR BTL) OPTIME
TOPICAL | Status: DC | PRN
  Administered 2012-02-22: 1000 mL

## 2012-02-22 MED ORDER — ADULT MULTIVITAMIN W/MINERALS CH
1.0000 | ORAL_TABLET | Freq: Every day | ORAL | Status: DC
  Administered 2012-02-23 – 2012-02-24 (×2): 1 via ORAL
  Filled 2012-02-22 (×2): qty 1

## 2012-02-22 SURGICAL SUPPLY — 102 items
ADH SKN CLS APL DERMABOND .7 (GAUZE/BANDAGES/DRESSINGS) ×6
APPLIER CLIP 9.375 MED OPEN (MISCELLANEOUS) ×8
APPLIER CLIP 9.375 SM OPEN (CLIP)
APR CLP MED 9.3 20 MLT OPN (MISCELLANEOUS) ×6
APR CLP SM 9.3 20 MLT OPN (CLIP)
ATCH SMKEVC FLXB CAUT HNDSWH (FILTER) ×1 IMPLANT
BAG DECANTER FOR FLEXI CONT (MISCELLANEOUS) ×4 IMPLANT
BINDER BREAST LRG (GAUZE/BANDAGES/DRESSINGS) IMPLANT
BINDER BREAST MEDIUM (GAUZE/BANDAGES/DRESSINGS) ×2 IMPLANT
BINDER BREAST XLRG (GAUZE/BANDAGES/DRESSINGS) IMPLANT
BIOPATCH RED 1 DISK 7.0 (GAUZE/BANDAGES/DRESSINGS) ×10 IMPLANT
BLADE SURG 10 STRL SS (BLADE) ×4 IMPLANT
BLADE SURG 15 STRL LF DISP TIS (BLADE) ×4 IMPLANT
BLADE SURG 15 STRL SS (BLADE) ×8
BNDG COHESIVE 4X5 TAN STRL (GAUZE/BANDAGES/DRESSINGS) IMPLANT
CANISTER SUCTION 2500CC (MISCELLANEOUS) ×8 IMPLANT
CHERRY SPONGEY 1/2 (GAUZE/BANDAGES/DRESSINGS) ×2 IMPLANT
CHLORAPREP W/TINT 26ML (MISCELLANEOUS) ×6 IMPLANT
CLIP APPLIE 9.375 MED OPEN (MISCELLANEOUS) ×6 IMPLANT
CLIP APPLIE 9.375 SM OPEN (CLIP) IMPLANT
CLOTH BEACON ORANGE TIMEOUT ST (SAFETY) ×8 IMPLANT
CONT SPEC 4OZ CLIKSEAL STRL BL (MISCELLANEOUS) ×8 IMPLANT
COVER PROBE W GEL 5X96 (DRAPES) ×4 IMPLANT
COVER SURGICAL LIGHT HANDLE (MISCELLANEOUS) ×8 IMPLANT
DERMABOND ADVANCED (GAUZE/BANDAGES/DRESSINGS) ×2
DERMABOND ADVANCED .7 DNX12 (GAUZE/BANDAGES/DRESSINGS) ×6 IMPLANT
DRAIN CHANNEL 19F RND (DRAIN) ×12 IMPLANT
DRAPE INCISE 23X17 IOBAN STRL (DRAPES)
DRAPE INCISE 23X17 STRL (DRAPES) IMPLANT
DRAPE INCISE IOBAN 23X17 STRL (DRAPES) IMPLANT
DRAPE INCISE IOBAN 85X60 (DRAPES) IMPLANT
DRAPE LAPAROSCOPIC ABDOMINAL (DRAPES) ×4 IMPLANT
DRAPE ORTHO SPLIT 77X108 STRL (DRAPES) ×24
DRAPE PROXIMA HALF (DRAPES) ×8 IMPLANT
DRAPE SURG 17X23 STRL (DRAPES) ×14 IMPLANT
DRAPE SURG ORHT 6 SPLT 77X108 (DRAPES) ×10 IMPLANT
DRAPE UTILITY 15X26 W/TAPE STR (DRAPE) ×8 IMPLANT
DRAPE WARM FLUID 44X44 (DRAPE) ×4 IMPLANT
DRSG MEPILEX BORDER 4X12 (GAUZE/BANDAGES/DRESSINGS) ×2 IMPLANT
DRSG MEPILEX BORDER 4X8 (GAUZE/BANDAGES/DRESSINGS) ×2 IMPLANT
DRSG PAD ABDOMINAL 8X10 ST (GAUZE/BANDAGES/DRESSINGS) ×8 IMPLANT
DRSG TEGADERM 4X4.75 (GAUZE/BANDAGES/DRESSINGS) ×4 IMPLANT
ELECT BLADE 4.0 EZ CLEAN MEGAD (MISCELLANEOUS) ×12
ELECT BLADE 6.5 EXT (BLADE) ×4 IMPLANT
ELECT CAUTERY BLADE 6.4 (BLADE) ×16 IMPLANT
ELECT REM PT RETURN 9FT ADLT (ELECTROSURGICAL) ×8
ELECTRODE BLDE 4.0 EZ CLN MEGD (MISCELLANEOUS) ×7 IMPLANT
ELECTRODE REM PT RTRN 9FT ADLT (ELECTROSURGICAL) ×6 IMPLANT
EVACUATOR SILICONE 100CC (DRAIN) ×12 IMPLANT
EVACUATOR SMOKE ACCUVAC VALLEY (FILTER) ×1
EVICEL 5ML KIT ×4 IMPLANT
GAUZE XEROFORM 5X9 LF (GAUZE/BANDAGES/DRESSINGS) ×4 IMPLANT
GLOVE BIO SURGEON STRL SZ 6.5 (GLOVE) ×8 IMPLANT
GLOVE EUDERMIC 7 POWDERFREE (GLOVE) ×4 IMPLANT
GLOVE SURG SS PI 6.0 STRL IVOR (GLOVE) ×2 IMPLANT
GLOVE SURG SS PI 6.5 STRL IVOR (GLOVE) ×2 IMPLANT
GOWN PREVENTION PLUS XLARGE (GOWN DISPOSABLE) ×4 IMPLANT
GOWN STRL NON-REIN LRG LVL3 (GOWN DISPOSABLE) ×30 IMPLANT
KIT BASIN OR (CUSTOM PROCEDURE TRAY) ×8 IMPLANT
KIT ROOM TURNOVER OR (KITS) ×8 IMPLANT
MENTOR CPX3 MEDIUM HEIGHT SILTEX CONTOUR PROFILE BREAST TISSUE EXPANDER WITH SUTURING TABS 275 CC ×2 IMPLANT
NDL 18GX1X1/2 (RX/OR ONLY) (NEEDLE) ×2 IMPLANT
NDL HYPO 25GX1X1/2 BEV (NEEDLE) ×2 IMPLANT
NEEDLE 18GX1X1/2 (RX/OR ONLY) (NEEDLE) ×4 IMPLANT
NEEDLE HYPO 25GX1X1/2 BEV (NEEDLE) ×4 IMPLANT
NS IRRIG 1000ML POUR BTL (IV SOLUTION) ×12 IMPLANT
PACK GENERAL/GYN (CUSTOM PROCEDURE TRAY) ×8 IMPLANT
PAD ARMBOARD 7.5X6 YLW CONV (MISCELLANEOUS) ×16 IMPLANT
PENCIL BUTTON HOLSTER BLD 10FT (ELECTRODE) ×6 IMPLANT
PIN SAFETY STERILE (MISCELLANEOUS) ×6 IMPLANT
SET ASEPTIC TRANSFER (MISCELLANEOUS) ×2 IMPLANT
SET COLLECT BLD 21X3/4 12 PB (MISCELLANEOUS) IMPLANT
SPECIMEN JAR X LARGE (MISCELLANEOUS) ×4 IMPLANT
SPONGE GAUZE 4X4 12PLY (GAUZE/BANDAGES/DRESSINGS) ×6 IMPLANT
SPONGE LAP 18X18 X RAY DECT (DISPOSABLE) ×2 IMPLANT
SPONGE LAP 4X18 X RAY DECT (DISPOSABLE) ×6 IMPLANT
STAPLER VISISTAT 35W (STAPLE) ×6 IMPLANT
STOCKINETTE IMPERVIOUS 9X36 MD (GAUZE/BANDAGES/DRESSINGS) IMPLANT
STOPCOCK 4 WAY LG BORE MALE ST (IV SETS) IMPLANT
STRIP CLOSURE SKIN 1/2X4 (GAUZE/BANDAGES/DRESSINGS) ×6 IMPLANT
SUT ETHILON 2 0 FS 18 (SUTURE) ×6 IMPLANT
SUT MNCRL AB 3-0 PS2 18 (SUTURE) ×8 IMPLANT
SUT MNCRL AB 4-0 PS2 18 (SUTURE) ×4 IMPLANT
SUT MON AB 4-0 PC3 18 (SUTURE) ×2 IMPLANT
SUT MON AB 5-0 PS2 18 (SUTURE) ×10 IMPLANT
SUT PDS AB 2-0 CT1 27 (SUTURE) ×16 IMPLANT
SUT SILK 2 0 FS (SUTURE) ×4 IMPLANT
SUT SILK 3 0 SH 30 (SUTURE) ×2 IMPLANT
SUT VIC AB 3-0 PS2 18 (SUTURE)
SUT VIC AB 3-0 PS2 18XBRD (SUTURE) ×8 IMPLANT
SUT VIC AB 3-0 SH 18 (SUTURE) ×6 IMPLANT
SUT VIC AB 3-0 SH 27 (SUTURE) ×12
SUT VIC AB 3-0 SH 27X BRD (SUTURE) ×9 IMPLANT
SUT VIC AB 3-0 SH 8-18 (SUTURE) ×8 IMPLANT
SUT VIC AB 4-0 PS2 27 (SUTURE) ×6 IMPLANT
SUT VICRYL 4-0 PS2 18IN ABS (SUTURE) ×10 IMPLANT
SYR 50ML SLIP (SYRINGE) IMPLANT
SYR BULB IRRIGATION 50ML (SYRINGE) ×4 IMPLANT
SYR CONTROL 10ML LL (SYRINGE) ×4 IMPLANT
TOWEL OR 17X24 6PK STRL BLUE (TOWEL DISPOSABLE) ×8 IMPLANT
TOWEL OR 17X26 10 PK STRL BLUE (TOWEL DISPOSABLE) ×8 IMPLANT
TRAY FOLEY CATH 14FRSI W/METER (CATHETERS) ×4 IMPLANT

## 2012-02-22 NOTE — H&P (View-Only) (Signed)
  This document contains confidential information from a [facility] medical record system and may be unauthenticated. Release may be made only with a valid authorization or in accordance with applicable policies of Medical Center or its affiliates. This document must be maintained in a secure manner or discarded/destroyed as required by Medical Center policy or by a confidential means such as shredding.   Christina Daniel   02/07/2012 8:00 AM Office Visit  MRN: [id number]  Department: [facility]  Dept Phone: [phone]  Description: Female DOB: 04/04/1951  Provider: Claire Sanger, DO    Diagnoses    Breast cancer   - Primary     Reason for Visit  -  Breast Reconstruction     Vitals - Last Recorded      127/89  1.702 m (5' 7")  54.885 kg (121 lb)  18.95 kg/m2       Subjective:    Patient ID: Christina Daniel is a 61 y.o. female.  HPI Tamberlyn L Burkland is a 61 yrs old female here for evaluation for breast reconstruction.  She noted a change in the right nipple and some nodules at the site of a prior lumpectomy scar. Dr. Streck did a biopsy (01/27/12) which showed invasive ductal carcinoma.  The nipple has become somewhat inverted and crusting over the last several months. The nodules were noted a year ago and have enlarged. A right breast lumpectomy was done in 1993 for DCIS.  At the same time LCIS was also found.  Postoperative radiation was performed but no chemotherapy.  She is planning on a right mastectomy now.  She is scheduled for an MRI next week. She wears a 34B bra, she is 5 feet 7 inches tall and weighs 121 pounds. No radiation is planned.  She has hypertension and hyperlipidemia.  She is otherwise in good health.  The following portions of the patient's history were reviewed and updated as appropriate: allergies, current medications, past family history, past medical history, past social history, past surgical history and problem list.  Review of Systems    Constitutional: Negative.   HENT: Negative.   Eyes: Negative.   Respiratory: Negative.   Cardiovascular: Negative.   Gastrointestinal: Negative.   Genitourinary: Negative.   Musculoskeletal: Negative.   Hematological: Negative.   Psychiatric/Behavioral: Negative.       Objective:    Physical Exam  Constitutional: She appears well-developed and well-nourished.  HENT:   Head: Normocephalic and atraumatic.  Eyes: Conjunctivae normal are normal. Pupils are equal, round, and reactive to light.  Neck: Normal range of motion.  Cardiovascular: Normal rate.   Pulmonary/Chest: Effort normal.  Abdominal: Soft. She exhibits no distension. There is no tenderness.  Neurological: She is alert.  Skin: Skin is warm.  Psychiatric: She has a normal mood and affect. Her behavior is normal. Judgment and thought content normal.     Assessment:     1.  Breast cancer        Plan:     Assessment and Plan:   A long, detailed conversation was had regarding the patient's options for breast reconstruction. Five main points, which are explained to all breast reconstruction patients, were discussed.   1. Breast reconstruction is an optional process.   2. Breast reconstruction is a multi-stage process which involves multiple surgeries spaced several months apart. The entire process can take over one year.   3. The major goal of breast reconstruction is to have the patient look normal   in clothing. When naked, there will always be scars.   4. Asymmetries are often present during the reconstruction process. Several operations may be needed, including surgery to the non-cancerous breast, to achieve satisfactory results.   5. No matter the reconstructive method, there are ways that the reconstruction can fail and a secondary reconstructive plan would need to be created.   A general discussion regarding all available methods of breast reconstruction were discussed. The types of reconstructions described included.    1. Tissue expander and implant based reconstruction, both single and multi-stage approaches.   2. Autologous only reconstructions, including free abdominal-tissue based reconstructions.   3. Combination procedures, particularly latissismus dorsi flaps combined with either expanders or implants.   For each of the reconstruction methods mentioned above, the risks, benefits, alternatives, scarring, and recovery time were discussed in great detail. Specific risks detailed included bleeding, infection, hematoma, seroma, scarring, pain, wound healing complications, flap loss, fat necrosis, capsular contracture, need for implant removal, donor site complications, bulge, hernia, umbilical necrosis, need for urgent reoperation, and need for dressing changes were discussed.   Assessment   Once all reconstruction options were presented, a focused discussion was had regarding the patient's suitability for each of these procedures.   A total of 50 minutes of face-to-face time was spent in this encounter, of which >50% was spent in counseling. The patient will need a right latissimus musculocutaneous flap with expander placement after immediate mastectomy.      

## 2012-02-22 NOTE — OR Nursing (Addendum)
Sent Sentinel node biopsy x 2 to Histology for touch prep @1000 , called received @1015  1st procedure time- 0932-1050 2nd procedure time 1155-1422 20ml of 0.9% Sodium Chloride- injected in expander

## 2012-02-22 NOTE — Op Note (Signed)
Tuesday TUWANDA VOKES 1951/01/09 [id number] 02/15/2012  Preoperative diagnosis: Right invasive ductal breast cancer, clinical stage I  Postoperative diagnosis: Same, but Stage II  Procedure: Right modified mastectomy with blue dye injection and axillary LN sampling  Surgeon: Currie Paris   Anesthesia:General  Clinical History and Indications:The patient is seen in the holding area and we reviewed the plans for the procedure as noted above. We reviewed the risks and complications a final time. She had no further questions. I marked theright side as the operative side.  Description of Procedure: The patient was taken to the operating room. After satisfactory general anesthesia was obtained the timeout was done.   I then injected 5 cc of dilute methylene blue and injected it subareaorly and massaged it in. A full prep and drape was then done.  I outlined an elliptical incision and marked the inframammary fold and midline. The incision was made. The usual skin flaps were raised. I went to the clavicle superiorly and sternum medially and towards the latissimus laterally.    After the superior flap was complete I was able to use the neoprobe to locate a sentinel node.  I located two nodes, both hot neither blue. No other hot or blue or palpably abnormal tissue was identified  Once the node was removed it was forwarded to pathology.   The inferior flap was then made going to the inframammary fold and out to the latissimus. The breast was then removed from the pectoralis starting medially and working laterally. When I got to the lateral aspect of the pectoralis major muscle I opened the clavipectoral fascia. I finished removing the breast from the serratus muscles.  At this point the pathologist reported on the sentinel node and that it was positive.  The clavipectoral fascia was openedmore completely and an axillary dissection completed by removing the axially fat pad from medial to lateral  and superior to inferior, staying below the axillary vein. The long thoracic and thoraco-dorsal nerves were identified and preserved  I spent several minutes irrigating and making sure everything was dry.A moist lap pad with antibiotic solution was placed in the wound and the wound covered with a sterile "sticky drape" so she could be repositioned for a reconstructin EBL was minimal Currie Paris, MD, FACS 02/22/2012 10:55 AM

## 2012-02-22 NOTE — Anesthesia Preprocedure Evaluation (Addendum)
Anesthesia Evaluation  Patient identified by MRN, date of birth, ID band Patient awake    Reviewed: Allergy & Precautions, H&P , NPO status , Patient's Chart, lab work & pertinent test results, reviewed documented beta blocker date and time   History of Anesthesia Complications Negative for: history of anesthetic complications  Airway Mallampati: I TM Distance: >3 FB Neck ROM: full    Dental  (+) Teeth Intact and Dental Advisory Given   Pulmonary neg pulmonary ROS,   Comparison: 04/01/2009   Findings: The heart size and pulmonary vascularity are normal and the lungs are clear.  There are old well healed fractures of the anterior aspects of the right sixth and seventh ribs.  Slight thoracolumbar scoliosis.   IMPRESSION: No acute abnormalities.      Pulmonary exam normal       Cardiovascular Exercise Tolerance: Good hypertension, Pt. on medications and Pt. on home beta blockers - dysrhythmias Rhythm:regular Rate:Normal  Hx of thoracic Aneurysm.  Pt w/o s/s/ of chest pain or DOE   Neuro/Psych PSYCHIATRIC DISORDERS Depression    GI/Hepatic negative GI ROS, Neg liver ROS,   Endo/Other  negative endocrine ROS  Renal/GU negative Renal ROS     Musculoskeletal negative musculoskeletal ROS (+)   Abdominal   Peds  Hematology negative hematology ROS (+)   Anesthesia Other Findings   Reproductive/Obstetrics negative OB ROS                      Anesthesia Physical Anesthesia Plan  ASA: II  Anesthesia Plan: General   Post-op Pain Management:    Induction: Intravenous  Airway Management Planned: Oral ETT  Additional Equipment:   Intra-op Plan:   Post-operative Plan: Extubation in OR  Informed Consent: I have reviewed the patients History and Physical, chart, labs and discussed the procedure including the risks, benefits and alternatives for the proposed anesthesia with the patient or  authorized representative who has indicated his/her understanding and acceptance.     Plan Discussed with: CRNA, Anesthesiologist and Surgeon  Anesthesia Plan Comments:         Anesthesia Quick Evaluation

## 2012-02-22 NOTE — Brief Op Note (Signed)
02/22/2012  2:31 PM  PATIENT:  Christina Daniel  61 y.o. female  PRE-OPERATIVE DIAGNOSIS:  right breast cancer  POST-OPERATIVE DIAGNOSIS:  Right Breast Cancer  PROCEDURE:  Procedure(s) (LRB) with comments: LATISSIMUS FLAP TO BREAST (Right) - right latissimus musculocutaneous flap for immediate right breast reconstruction with expander placement  TISSUE EXPANDER (Right) MODIFIED MASTECTOMY (Right) - with Axillary Sentinel node biopsy  x 2   SURGEON:  Surgeon(s) and Role: Panel 1:    * Currie Paris, MD - Primary  Panel 2:    * Sidnie Swalley Sanger, DO - Primary  PHYSICIAN ASSISTANT:   ASSISTANTS: Mindy Toth,LPN   ANESTHESIA:   general  EBL:  Total I/O In: 4000 [I.V.:4000] Out: 695 [Urine:470; Blood:225]  BLOOD ADMINISTERED:none  DRAINS: (3) Jackson-Pratt drain(s) with closed bulb suction in the one in the right breast pocket and two in the right back.   LOCAL MEDICATIONS USED:  NONE  SPECIMEN:  No Specimen  DISPOSITION OF SPECIMEN:  N/A  COUNTS:  YES  TOURNIQUET:  * No tourniquets in log *  DICTATION: dictated  PLAN OF CARE: Admit to inpatient   PATIENT DISPOSITION:  PACU - hemodynamically stable.   Delay start of Pharmacological VTE agent (>24hrs) due to surgical blood loss or risk of bleeding: no

## 2012-02-22 NOTE — Anesthesia Postprocedure Evaluation (Signed)
  Anesthesia Post-op Note  Patient: Christina Daniel  Procedure(s) Performed: Procedure(s) (LRB) with comments: LATISSIMUS FLAP TO BREAST (Right) - right latissimus musculocutaneous flap for immediate right breast reconstruction with expander placement  TISSUE EXPANDER (Right) MODIFIED MASTECTOMY (Right) - with Axillary Sentinel node biopsy  x 2   Patient Location: PACU  Anesthesia Type: General  Level of Consciousness: awake, oriented, sedated and patient cooperative  Airway and Oxygen Therapy: Patient Spontanous Breathing and Patient connected to nasal cannula oxygen  Post-op Pain: mild  Post-op Assessment: Post-op Vital signs reviewed, Patient's Cardiovascular Status Stable, Respiratory Function Stable, Patent Airway, No signs of Nausea or vomiting and Pain level controlled  Post-op Vital Signs: stable  Complications: No apparent anesthesia complications

## 2012-02-22 NOTE — H&P (View-Only) (Signed)
Christina Daniel DOB: 1950/08/16 MRN: [id number]                                                                                      DATE: 01/27/2012  PCP: Gretel Acre, MD Referring Provider: Rosendo Gros, MD  IMPRESSION:  Nipple crusting and scar nodule X3 at site of prior lumpectomy for DCIS/LCIS  PLAN:   I think both areas need Bx with punch. Discussed with patient and she wishes to proceed.  PROCEDURE NOTE: The right nipple area in the more medial of 3 skin nodules are prepped with alcohol and anesthetized with 1% Xylocaine with epinephrine. A 3 mm punch biopsy is taken from the both sites.                 CC:  Chief Complaint  Patient presents with  . Breast Problem    HPI:  Christina Daniel is a 61 y.o.  female who presents for evaluation of a change in the right nipple and some nodules on a prior lumpectomy scar. She thinks the nipple has become somewhat inverted and crusting over the last several months. The nodules she noted about a year ago and they may have enlarged a bit. In 1993 she had a lumpectomy for a DCIS in the right breast. LCIS was also found. She had postoperative radiation. Apparently she did not have any other treatments. I have seen her for some benign problems of the left breast since then but there has been no further evidence of cancer. These areas are not symptomatic.  PMH:  has a past medical history of Cancer (1993).  PSH:   has past surgical history that includes Breast surgery (12/10/1991).  ALLERGIES:   Allergies  Allergen Reactions  . Sulfa Antibiotics     MEDICATIONS: Current outpatient prescriptions:alendronate (FOSAMAX) 70 MG tablet, , Disp: , Rfl: ;  hydrochlorothiazide (HYDRODIURIL) 25 MG tablet, , Disp: , Rfl: ;  HYDROcodone-acetaminophen (VICODIN) 5-500 MG per tablet, , Disp: , Rfl: ;  metoprolol succinate (TOPROL-XL) 25 MG 24 hr tablet, , Disp: , Rfl: ;  promethazine (PHENERGAN) 25 MG tablet, , Disp: , Rfl: ;  simvastatin (ZOCOR) 20  MG tablet, , Disp: , Rfl:   ROS: She has filled out our 12 point review of systems and it is negative . EXAM:   General: Patient alert, NAD VS: BP 88/62  Pulse 64  Temp 98.6 F (37 C) (Temporal)  Resp 12  Ht 5\' 7"  (1.702 m)  Wt 120 lb 12.8 oz (54.795 kg)  BMI 18.92 kg/m2 Breasts; The right breast shows a flattened nipple with crusting. There are three red raised 2-58mm nodules along the old scar  DATA REVIEWED:  Reviewed old chart.    Cortavious Nix J 01/27/2012  CC: Rosendo Gros, MD, Gretel Acre, MD

## 2012-02-22 NOTE — Transfer of Care (Signed)
Immediate Anesthesia Transfer of Care Note  Patient: Christina Daniel  Procedure(s) Performed: Procedure(s) (LRB) with comments: LATISSIMUS FLAP TO BREAST (Right) - right latissimus musculocutaneous flap for immediate right breast reconstruction with expander placement  TISSUE EXPANDER (Right) MODIFIED MASTECTOMY (Right) - with Axillary Sentinel node biopsy  x 2   Patient Location: PACU  Anesthesia Type: General  Level of Consciousness: awake, alert , oriented and patient cooperative  Airway & Oxygen Therapy: Patient Spontanous Breathing and Patient connected to nasal cannula oxygen  Post-op Assessment: Report given to PACU RN  Post vital signs: Reviewed and stable  Complications: No apparent anesthesia complications

## 2012-02-22 NOTE — Progress Notes (Signed)
PHARMACIST - PHYSICIAN COMMUNICATION  CONCERNING: P&T Medication Policy Regarding Oral Bisphosphonates  RECOMMENDATION: Your order for alendronate (Fosamax), ibandronate (Boniva), or risedronate (Actonel) has been discontinued at this time.  If the patient's post-hospital medical condition warrants safe use of this class of drugs, please resume the pre-hospital regimen upon discharge.  DESCRIPTION:  Alendronate (Fosamax), ibandronate (Boniva), and risedronate (Actonel) can cause severe esophageal erosions in patients who are unable to remain upright at least 30 minutes after taking this medication.   Since brief interruptions in therapy are thought to have minimal impact on bone mineral density, the Pharmacy & Therapeutics Committee has established that bisphosphonate orders should be routinely discontinued during hospitalization.   To override this safety policy and permit administration of Boniva, Fosamax, or Actonel in the hospital, prescribers must write "DO NOT HOLD" in the comments section when placing the order for this class of medications.    [place], [address].D., BCPS Clinical Pharmacist Pager: [phone] 02/22/2012 5:29 PM

## 2012-02-22 NOTE — Anesthesia Procedure Notes (Signed)
Procedure Name: Intubation Date/Time: 02/22/2012 9:01 AM Performed by: Tyrone Nine Pre-anesthesia Checklist: Patient identified, Timeout performed, Emergency Drugs available, Suction available and Patient being monitored Patient Re-evaluated:Patient Re-evaluated prior to inductionOxygen Delivery Method: Circle system utilized Preoxygenation: Pre-oxygenation with 100% oxygen Intubation Type: IV induction Ventilation: Mask ventilation without difficulty Laryngoscope Size: Mac and 3 Grade View: Grade I Tube type: Oral Number of attempts: 1 Airway Equipment and Method: Stylet Placement Confirmation: ETT inserted through vocal cords under direct vision,  positive ETCO2,  CO2 detector and breath sounds checked- equal and bilateral Secured at: 21 cm Tube secured with: Tape Dental Injury: Teeth and Oropharynx as per pre-operative assessment

## 2012-02-22 NOTE — Interval H&P Note (Signed)
History and Physical Interval Note:  02/22/2012 8:40 AM  Christina Daniel  has presented today for surgery, with the diagnosis of right breast cancer  The various methods of treatment have been discussed with the patient and family. After consideration of risks, benefits and other options for treatment, the patient has consented to  Procedure(s) (LRB) with comments: SIMPLE MASTECTOMY WITH AXILLARY SENTINEL NODE BIOPSY (Right) - right total mastectomy and sentinel node LATISSIMUS FLAP TO BREAST (Right) - right latissimus musculocutaneous flap for immediate right breast reconstruction with expander placement  TISSUE EXPANDER (Right) as a surgical intervention .  The patient's history has been reviewed, patient examined, no change in status, stable for surgery.  I have reviewed the patient's chart and labs.  Questions were answered to the patient's satisfaction.   I have reviewed plans and answered questions again. The right breast marked as the operative side. Christina Daniel

## 2012-02-22 NOTE — Interval H&P Note (Signed)
History and Physical Interval Note:  02/22/2012 10:19 AM  Christina Daniel  has presented today for surgery, with the diagnosis of right breast cancer  The various methods of treatment have been discussed with the patient and family. After consideration of risks, benefits and other options for treatment, the patient has consented to  Procedure(s) (LRB) with comments: SIMPLE MASTECTOMY WITH AXILLARY SENTINEL NODE BIOPSY (Right) - right total mastectomy and sentinel node LATISSIMUS FLAP TO BREAST (Right) - right latissimus musculocutaneous flap for immediate right breast reconstruction with expander placement  TISSUE EXPANDER (Right) as a surgical intervention .  The patient's history has been reviewed, patient examined, no change in status, stable for surgery.  I have reviewed the patient's chart and labs.  Questions were answered to the patient's satisfaction.     SANGER,CLAIRE

## 2012-02-22 NOTE — Preoperative (Addendum)
Beta Blockers  Pt took toprol on 02/22/12   @ 06:00

## 2012-02-23 MED ORDER — PANTOPRAZOLE SODIUM 40 MG PO TBEC
40.0000 mg | DELAYED_RELEASE_TABLET | Freq: Every day | ORAL | Status: DC
  Administered 2012-02-23 – 2012-02-24 (×2): 40 mg via ORAL
  Filled 2012-02-23 (×2): qty 1

## 2012-02-23 NOTE — Progress Notes (Signed)
Shawn Rayburn, PA returned pages from earlier. Notified of holding HCTZ this am due to low BP, 105/64, pt had been in the 90's during night. Afternoon BP 98/62. She will reassess BP in am, and decide if med will be given tomorrow.  Also discussed pt's concern about heat in room and questions about heat/temperature needed at home. Pt to keep warm here as well as at home for atleast one week per PA. Will inform pt and PA will discuss and reinforce need when she rounds in am.

## 2012-02-23 NOTE — Progress Notes (Signed)
1 Day Post-Op  Subjective: Pt reports she did well overnight. Pain is under good control. Some nausea, but no emesis and tolerating some po liquids well.  She does feel like her upper extremities and ankles are very edematous this am, but she was about 3 liters positive from surgery yesterday and did not have her usual HCTZ. Will kvo IVF , resume HCTZ and mobilize OOB more.   JP's with 250 cc of serosang drainage overnight.   Right breast incisions are intact and without drainage or signs of infection.  Operative dressing to the latissimus area is intact and without drainage on the dresssing  Objective: Vital signs in last 24 hours: Temp:  [97.3 F (36.3 C)-99.2 F (37.3 C)] 98.4 F (36.9 C) (10/24 0459) Pulse Rate:  [50-80] 80  (10/24 0459) Resp:  [9-18] 18  (10/24 0459) BP: (97-124)/(54-70) 105/63 mmHg (10/24 0459) SpO2:  [91 %-100 %] 98 % (10/24 0459) Weight:  [54.432 kg (120 lb)] 54.432 kg (120 lb) (10/23 1715) Last BM Date: 02/21/12  Intake/Output from previous day: 10/23 0701 - 10/24 0700 In: 5595 [P.O.:240; I.V.:5355] Out: 1575 [Urine:1100; Drains:250; Blood:225] Intake/Output this shift:    General appearance: alert, cooperative, appears stated age and no distress Resp: clear to auscultation bilaterally Cardio: regular rate and rhythm GI: soft, non-tender; bowel sounds normal; no masses,  no organomegaly  Lab Results:   Basename 02/22/12 1724  WBC 13.0*  HGB 11.8*  HCT 34.4*  PLT 193   BMET  Basename 02/22/12 1724  NA 138  K 3.8  CL 104  CO2 24  GLUCOSE 129*  BUN 11  CREATININE 0.46*  CALCIUM 8.1*   PT/INR No results found for this basename: LABPROT:2,INR:2 in the last 72 hours ABG No results found for this basename: PHART:2,PCO2:2,PO2:2,HCO3:2 in the last 72 hours  Studies/Results: Nm Sentinel Node Inj-no Rpt (breast)  02/22/2012  CLINICAL DATA: Right breast cancer   Sulfur colloid was injected intradermally by the nuclear medicine  technologist  for breast cancer sentinel node localization.      Anti-infectives: Anti-infectives     Start     Dose/Rate Route Frequency Ordered Stop   02/22/12 1800   Ampicillin-Sulbactam (UNASYN) 3 g in sodium chloride 0.9 % 100 mL IVPB        3 g 100 mL/hr over 60 Minutes Intravenous Every 6 hours 02/22/12 1726     02/22/12 0951   polymyxin B 500,000 Units, bacitracin 50,000 Units in sodium chloride irrigation 0.9 % 500 mL irrigation  Status:  Discontinued          As needed 02/22/12 0952 02/22/12 1432   02/22/12 0600   ceFAZolin (ANCEF) IVPB 2 g/50 mL premix        2 g 100 mL/hr over 30 Minutes Intravenous On call to O.R. 02/21/12 1458 02/22/12 0915          Assessment/Plan: s/p Procedure(s) (LRB) with comments: LATISSIMUS FLAP TO BREAST (Right) - right latissimus musculocutaneous flap for immediate right breast reconstruction with expander placement  TISSUE EXPANDER (Right) MODIFIED MASTECTOMY (Right) - with Axillary Sentinel node biopsy  x 2  POD#1 Doing well Advance diet Mobilize OOB today.  Decrease MIVF and resume HCTZ to add diuresis Dr. Jamey Ripa discussed positive sentinel node with pt. Await path from other nodes  LOS: 1 day    [facility]  [phone]

## 2012-02-23 NOTE — Progress Notes (Signed)
1 Day Post-Op   Assessment: s/p Procedure(s): LATISSIMUS FLAP TO BREAST TISSUE EXPANDER MODIFIED MASTECTOMY Patient Active Problem List  Diagnosis  . Personal history of malignant neoplasm of breast  . Nipple crusting  . Breast cancer, right breast  . Cancer    Stable one day post op  Plan: Advance diet Discussed OR finding of + SLN with patient  Subjective: Feels OK, notes edema of arms and legs. Pain well controlled and nausea improved  Objective: Vital signs in last 24 hours: Temp:  [97.3 F (36.3 C)-99.2 F (37.3 C)] 98.4 F (36.9 C) (10/24 0459) Pulse Rate:  [50-80] 80  (10/24 0459) Resp:  [9-18] 18  (10/24 0459) BP: (97-124)/(54-70) 105/63 mmHg (10/24 0459) SpO2:  [91 %-100 %] 98 % (10/24 0459) Weight:  [120 lb (54.432 kg)] 120 lb (54.432 kg) (10/23 1715)   Intake/Output from previous day: 10/23 0701 - 10/24 0700 In: 5595 [P.O.:240; I.V.:5355] Out: 1575 [Urine:1100; Drains:250; Blood:225] Intake/Output this shift:     General appearance: alert, cooperative and no distress  Incision: healing well, no significant drainage  Lab Results:   Basename 02/22/12 1724  WBC 13.0*  HGB 11.8*  HCT 34.4*  PLT 193   BMET  Basename 02/22/12 1724  NA 138  K 3.8  CL 104  CO2 24  GLUCOSE 129*  BUN 11  CREATININE 0.46*  CALCIUM 8.1*   PT/INR No results found for this basename: LABPROT:2,INR:2 in the last 72 hours ABG No results found for this basename: PHART:2,PCO2:2,PO2:2,HCO3:2 in the last 72 hours  MEDS, Scheduled    . ampicillin-sulbactam (UNASYN) IV  3 g Intravenous Q6H  . calcium carbonate  1 tablet Oral Q breakfast  . docusate sodium  100 mg Oral TID  . hydrochlorothiazide  12.5 mg Oral Daily  . HYDROmorphone      . metoprolol succinate  25 mg Oral Daily  . multivitamin with minerals  1 tablet Oral Daily  . pantoprazole (PROTONIX) IV  40 mg Intravenous QHS  . simvastatin  20 mg Oral q1800  . DISCONTD: alendronate  70 mg Oral Q7 days  .  DISCONTD: Calcium  75 mg Oral 1 day or 1 dose  . DISCONTD: droperidol  0.625-2.5 mg Intravenous To OR  . DISCONTD: hydrochlorothiazide  12.5 mg Oral Daily    Studies/Results: Nm Sentinel Node Inj-no Rpt (breast)  02/22/2012  CLINICAL DATA: Right breast cancer   Sulfur colloid was injected intradermally by the nuclear medicine  technologist for breast cancer sentinel node localization.        LOS: 1 day     Currie Paris, MD, [facility], [place] [phone]   02/23/2012 9:50 AM

## 2012-02-24 ENCOUNTER — Other Ambulatory Visit (INDEPENDENT_AMBULATORY_CARE_PROVIDER_SITE_OTHER): Payer: Self-pay | Admitting: Surgery

## 2012-02-24 MED ORDER — KCL IN DEXTROSE-NACL 20-5-0.45 MEQ/L-%-% IV SOLN
INTRAVENOUS | Status: DC
  Filled 2012-02-24: qty 1000

## 2012-02-24 MED ORDER — ONDANSETRON HCL 4 MG/2ML IJ SOLN: 2.0000 mg | Freq: Four times a day (QID) | INTRAMUSCULAR | Status: DC | PRN

## 2012-02-24 MED ORDER — HYDROCODONE-ACETAMINOPHEN 5-325 MG PO TABS: 1.0000 | ORAL_TABLET | ORAL | Status: DC | PRN

## 2012-02-24 NOTE — Discharge Summary (Signed)
Physician Discharge Summary  Patient ID: Christina Daniel MRN: [id number] DOB/AGE: Jan 30, 1951 61 y.o.  Admit date: 02/22/2012 Discharge date: 02/24/2012  Admission Diagnoses:  Discharge Diagnoses:  Active Problems:  * No active hospital problems. *    Discharged Condition: good  Hospital Course: The patent was taken to the OR and underwent a right mastectomy with LND due to a positive SLN. She then underwent immediate reconstruction with latissimus myocutaneous muscle flap and expander placement.  She tolerated the procedure well and was managed on the surgery unit postoperatively.  She was eating, drinking, pain well controlled, and ambulating at the time of discharge.  Consults: None  Significant Diagnostic Studies: labs  Treatments: surgery: mastectomy  Discharge Exam: Blood pressure 120/73, pulse 72, temperature 98.5 F (36.9 C), temperature source Oral, resp. rate 16, height 5\' 7"  (1.702 m), weight 54.432 kg (120 lb), SpO2 94.00%. General appearance: alert, cooperative and no distress  Disposition: Final discharge disposition not confirmed  Discharge Orders    Future Appointments: Provider: Department: Dept Phone: Center:   03/09/2012 12:00 PM Currie Paris, MD [facility] [phone] None   03/13/2012 12:00 PM [facility] [phone] None   03/13/2012 1:30 PM [facility] A2 [facility] [phone] None   03/16/2012 2:15 PM Amy Allegra Grana, PA [facility] [phone] None   03/21/2012 9:30 AM Sherrie Mustache [facility] [phone] None   03/21/2012 10:30 AM [facility] D11 [facility] [phone] None   03/22/2012 10:45 AM [facility] Inj Nurse [facility] [phone] None   03/26/2012 1:00 PM Baltazar Najjar [facility] [phone] None   03/26/2012 2:00 PM Radene Gunning [facility] [phone] None   03/28/2012 10:00 AM Dava Najjar Idelle Jo [facility] [phone] None   04/04/2012 9:15 AM Windell Hummingbird [facility] [phone] None   04/04/2012 9:45 AM Amy Allegra Grana, PA [facility] [phone] None   04/11/2012 10:15 AM Windell Hummingbird [facility] [phone] None   04/11/2012 10:45 AM Amy Allegra Grana, PA [facility] [phone] None   04/12/2012 10:00 AM [facility] Inj Nurse [facility] [phone] None   04/18/2012 12:45 PM Beverely Pace Shumate [facility] [phone] None   04/18/2012 1:15 PM Amy Allegra Grana, PA [facility] [phone] None       Medication List     As of 02/24/2012  2:28 PM    ASK your doctor about these medications         alendronate 70 MG tablet   Commonly known as: FOSAMAX   Take 70 mg by mouth every 7 (seven) days. Take on sundays      aspirin 81 MG tablet   Take 81 mg by mouth daily.      CALCIUM PO   Take 1 tablet by mouth 2 (two) times daily.      hydrochlorothiazide 25 MG tablet   Commonly known as: HYDRODIURIL   Take 12.5 mg by mouth daily.      LUTEIN-ZEAXANTHIN PO   Take 1 tablet by mouth daily.      metoprolol succinate 25 MG 24 hr tablet   Commonly known as: TOPROL-XL   Take 25 mg by mouth daily.      multivitamin with minerals Tabs   Take 1 tablet by mouth daily.      omega-3 acid ethyl esters 1 G capsule   Commonly known as: LOVAZA   Take 2 g by mouth 2 (two) times daily.      simvastatin 20 MG tablet  Commonly known as: ZOCOR   Take 20 mg by mouth daily.           Follow-up Information    Follow up with Currie Paris, MD. Schedule an appointment as soon as possible for a visit in 2 weeks.   Contact information:   [address] [place] [address] [phone]          Signed: Wayland Denis 02/24/2012, 2:28 PM

## 2012-02-24 NOTE — Progress Notes (Signed)
Patient discharged to home with family.  Discharge teaching completed including follow up care, medications, signs and symptoms of infections and activity.  Patient and family itstructed about drain care and emptied and recharged JP drain without difficulty.  Verbalizes understanding with discharge teaching with no further questions.  Vital signs stable.

## 2012-02-24 NOTE — Discharge Summary (Signed)
  Patient ID: Christina Daniel [id number] 61 y.o. 1950/07/30  Admission  Date: 02/22/2012  Discharge date and time: No discharge date for patient encounter.  Admitting Physician: Currie Paris  Discharge Physician: Currie Paris  Admission Diagnoses: right breast cancer, mT4b,M1a  Discharge Diagnoses: Right Invasive ductal carcinoma  Operations: Procedure(s): LATISSIMUS FLAP TO BREAST TISSUE EXPANDER MODIFIED MASTECTOMY  Discharged Condition: good  Hospital Course: Patient kept in for two post op days, improved and ready to discharge  Consults: None  Significant Diagnostic Studies: Pathology: 1. Lymph node, sentinel, biopsy, Right breast - METASTATIC CARCINOMA IN 1 OF 1 LYMPH NODE (1/1). 2. Lymph node, sentinel, biopsy, Right breast - THERE IS NO EVIDENCE OF CARCINOMA IN 1 OF 1 LYMPH NODE (0/1). 3. Breast, simple mastectomy, right - INVASIVE DUCTAL CARCINOMA, GRADE II/III, MULTIPLE FOCI, THE LARGEST SPANS 1.4 CM. - DUCTAL CARCINOMA IN SITU, INTERMEDIATE GRADE. - LYMPHOVASCULAR INVASION IS IDENTIFIED, DIFFUSE, INCLUDING DERMAL LYMPHATICS. - THE SURGICAL RESECTION MARGINS ARE NEGATIVE FOR CARCINOMA. - SEE ONCOLOGY TABLE BELOW. 4. Lymph nodes, regional resection - THERE IS NO EVIDENCE OF CARCINOMA IN 6 OF 6 LYMPH NODES (0/6).   Disposition: Home

## 2012-02-24 NOTE — Progress Notes (Signed)
2 Days Post-Op   Assessment: s/p Procedure(s): LATISSIMUS FLAP TO BREAST TISSUE EXPANDER MODIFIED MASTECTOMY Patient Active Problem List  Diagnosis  . Personal history of malignant neoplasm of breast  . Nipple crusting  . Breast cancer, right breast  . Cancer    Doing well hopefully home later today  Plan: Discharge Later today; path noted and discussed briefly with patient. Will set up port placement   Subjective: Feels OK, less pain, thinking of going home  Objective: Vital signs in last 24 hours: Temp:  [98.6 F (37 C)-99.6 F (37.6 C)] 98.8 F (37.1 C) (10/25 0628) Pulse Rate:  [56-81] 72  (10/25 0628) Resp:  [18] 18  (10/25 0628) BP: (86-106)/(46-71) 100/71 mmHg (10/25 0628) SpO2:  [94 %-99 %] 95 % (10/25 0628)   Intake/Output from previous day: 10/24 0701 - 10/25 0700 In: 1476.2 [P.O.:600; I.V.:676.2; IV Piggyback:200] Out: 205 [Drains:205] Intake/Output this shift:     General appearance: alert, cooperative and no distress  Incision: Not examined today - seen by Dr Kelly Splinter  Lab Results:   Alvira Philips 02/22/12 1724  WBC 13.0*  HGB 11.8*  HCT 34.4*  PLT 193   BMET  Basename 02/22/12 1724  NA 138  K 3.8  CL 104  CO2 24  GLUCOSE 129*  BUN 11  CREATININE 0.46*  CALCIUM 8.1*   PT/INR No results found for this basename: LABPROT:2,INR:2 in the last 72 hours ABG No results found for this basename: PHART:2,PCO2:2,PO2:2,HCO3:2 in the last 72 hours  MEDS, Scheduled    . ampicillin-sulbactam (UNASYN) IV  3 g Intravenous Q6H  . calcium carbonate  1 tablet Oral Q breakfast  . docusate sodium  100 mg Oral TID  . hydrochlorothiazide  12.5 mg Oral Daily  . metoprolol succinate  25 mg Oral Daily  . multivitamin with minerals  1 tablet Oral Daily  . pantoprazole  40 mg Oral Q1200  . simvastatin  20 mg Oral q1800  . DISCONTD: pantoprazole (PROTONIX) IV  40 mg Intravenous QHS    Studies/Results: Nm Sentinel Node Inj-no Rpt (breast)  02/22/2012   CLINICAL DATA: Right breast cancer   Sulfur colloid was injected intradermally by the nuclear medicine  technologist for breast cancer sentinel node localization.        LOS: 2 days     Currie Paris, MD, [facility], [place] [phone]   02/24/2012 8:02 AM

## 2012-02-24 NOTE — Progress Notes (Signed)
2 Days Post-Op  Subjective: The patient is post operative day 2 from a breast reconstruction after mastectomy.  She has no complaints.  Objective: Vital signs in last 24 hours: Temp:  [98.6 F (37 C)-99.6 F (37.6 C)] 98.8 F (37.1 C) (10/25 0628) Pulse Rate:  [56-81] 72  (10/25 0628) Resp:  [18] 18  (10/25 0628) BP: (86-106)/(46-71) 100/71 mmHg (10/25 0628) SpO2:  [94 %-99 %] 95 % (10/25 0628) Last BM Date: 02/22/12  Intake/Output from previous day: 10/24 0701 - 10/25 0700 In: 1476.2 [P.O.:600; I.V.:676.2; IV Piggyback:200] Out: 205 [Drains:205] Intake/Output this shift:    General appearance: alert, cooperative and no distress Incision/Wound:dry and intact.  Good capillary refill.  Lab Results:   [facility] 02/22/12 1724  WBC 13.0*  HGB 11.8*  HCT 34.4*  PLT 193   BMET  Basename 02/22/12 1724  NA 138  K 3.8  CL 104  CO2 24  GLUCOSE 129*  BUN 11  CREATININE 0.46*  CALCIUM 8.1*   PT/INR No results found for this basename: LABPROT:2,INR:2 in the last 72 hours ABG No results found for this basename: PHART:2,PCO2:2,PO2:2,HCO3:2 in the last 72 hours  Studies/Results: Nm Sentinel Node Inj-no Rpt (breast)  02/22/2012  CLINICAL DATA: Right breast cancer   Sulfur colloid was injected intradermally by the nuclear medicine  technologist for breast cancer sentinel node localization.      Anti-infectives: Anti-infectives     Start     Dose/Rate Route Frequency Ordered Stop   02/22/12 1800   Ampicillin-Sulbactam (UNASYN) 3 g in sodium chloride 0.9 % 100 mL IVPB        3 g 100 mL/hr over 60 Minutes Intravenous Every 6 hours 02/22/12 1726     02/22/12 0951   polymyxin B 500,000 Units, bacitracin 50,000 Units in sodium chloride irrigation 0.9 % 500 mL irrigation  Status:  Discontinued          As needed 02/22/12 0952 02/22/12 1432   02/22/12 0600   ceFAZolin (ANCEF) IVPB 2 g/50 mL premix        2 g 100 mL/hr over 30 Minutes Intravenous On call to O.R. 02/21/12 1458  02/22/12 0915          Assessment/Plan: s/p Procedure(s) (LRB) with comments: LATISSIMUS FLAP TO BREAST (Right) - right latissimus musculocutaneous flap for immediate right breast reconstruction with expander placement  TISSUE EXPANDER (Right) MODIFIED MASTECTOMY (Right) - with Axillary Sentinel node biopsy  x 2  Discharge either later today or in the morning.  LOS: 2 days    [facility] 02/24/2012

## 2012-02-27 ENCOUNTER — Other Ambulatory Visit: Payer: Self-pay | Admitting: Oncology

## 2012-02-27 ENCOUNTER — Encounter (HOSPITAL_COMMUNITY): Payer: Self-pay | Admitting: Surgery

## 2012-02-27 ENCOUNTER — Other Ambulatory Visit: Payer: Self-pay | Admitting: *Deleted

## 2012-02-28 ENCOUNTER — Other Ambulatory Visit: Payer: BC Managed Care – PPO

## 2012-03-01 ENCOUNTER — Telehealth (INDEPENDENT_AMBULATORY_CARE_PROVIDER_SITE_OTHER): Payer: Self-pay | Admitting: General Surgery

## 2012-03-01 NOTE — Telephone Encounter (Signed)
Pt calling to ask if lethargy is to be expected following her Rt mastectomy and reconstruction surgery.  Explained to her that this was a big procedure and she should expect the recovery to be 6-8 weeks.  She is already off pain meds and trying to go out shopping but get tired quickly.  Pt was encouraged to rest, keep up with diet and nutrition and allow her body time to heal.  She understands.

## 2012-03-05 ENCOUNTER — Encounter: Payer: Self-pay | Admitting: *Deleted

## 2012-03-05 NOTE — Progress Notes (Signed)
Mailed after appt letter to pt. 

## 2012-03-07 ENCOUNTER — Telehealth (INDEPENDENT_AMBULATORY_CARE_PROVIDER_SITE_OTHER): Payer: Self-pay

## 2012-03-07 ENCOUNTER — Ambulatory Visit (INDEPENDENT_AMBULATORY_CARE_PROVIDER_SITE_OTHER): Payer: BC Managed Care – PPO | Admitting: Surgery

## 2012-03-07 ENCOUNTER — Other Ambulatory Visit (INDEPENDENT_AMBULATORY_CARE_PROVIDER_SITE_OTHER): Payer: Self-pay

## 2012-03-07 ENCOUNTER — Other Ambulatory Visit: Payer: BC Managed Care – PPO | Admitting: Lab

## 2012-03-07 ENCOUNTER — Encounter (INDEPENDENT_AMBULATORY_CARE_PROVIDER_SITE_OTHER): Payer: Self-pay | Admitting: Surgery

## 2012-03-07 ENCOUNTER — Other Ambulatory Visit: Payer: Self-pay | Admitting: Emergency Medicine

## 2012-03-07 VITALS — BP 106/60 | HR 112 | Temp 98.9°F | Resp 18 | Ht 67.0 in | Wt 122.2 lb

## 2012-03-07 DIAGNOSIS — Z09 Encounter for follow-up examination after completed treatment for conditions other than malignant neoplasm: Secondary | ICD-10-CM

## 2012-03-07 DIAGNOSIS — T8140XA Infection following a procedure, unspecified, initial encounter: Secondary | ICD-10-CM

## 2012-03-07 DIAGNOSIS — C50911 Malignant neoplasm of unspecified site of right female breast: Secondary | ICD-10-CM

## 2012-03-07 DIAGNOSIS — Z9010 Acquired absence of unspecified breast and nipple: Secondary | ICD-10-CM

## 2012-03-07 DIAGNOSIS — D72829 Elevated white blood cell count, unspecified: Secondary | ICD-10-CM

## 2012-03-07 LAB — CBC WITH DIFFERENTIAL/PLATELET
Lymphocytes Relative: 4 % — ABNORMAL LOW (ref 12–46)
MCV: 94.5 fL (ref 78.0–100.0)
Neutro Abs: 20.2 10*3/uL — ABNORMAL HIGH (ref 1.7–7.7)
Neutrophils Relative %: 93 % — ABNORMAL HIGH (ref 43–77)
Platelets: 384 10*3/uL (ref 150–400)
RBC: 3.89 MIL/uL (ref 3.87–5.11)
RDW: 13.7 % (ref 11.5–15.5)
WBC: 21.7 10*3/uL — ABNORMAL HIGH (ref 4.0–10.5)

## 2012-03-07 LAB — URINALYSIS
Glucose, UA: NEGATIVE mg/dL
Ketones, ur: NEGATIVE mg/dL
Nitrite: NEGATIVE
Specific Gravity, Urine: 1.025 (ref 1.005–1.030)
pH: 7 (ref 5.0–8.0)

## 2012-03-07 NOTE — Telephone Encounter (Signed)
Pt returned missed call; instructed to "go now" for CBC and Ua culture.  She understands and will comply.

## 2012-03-07 NOTE — Progress Notes (Addendum)
Chief complaint: Low-grade fever, change in the quality of the Jackson-Pratt drainage, and urinary tract symptoms  History of present illness: This patient is about two-week status post a right total mastectomy with latissimus flap reconstruction. She's been doing well. She was seen by her plastic surgeon yesterday and one drain removed. She started feeling bad sometimes after that. She did reports that she had a little blood in her urine yesterday. She noticed that the character of the drainage changed from a pink Kool-Aid to a more yellowish cloudy material. She's not had any changes in the symptoms around her incision but continues to have pain around the posterior incision from the latissimus flap.she called her plastic surgeon last night and was started on some Cipro she is now taken for 2 doses.  Exam: Vital signs:BP 106/60  Pulse 112  Temp 98.9 F (37.2 C) (Oral)  Resp 18  Ht 5' 7" (1.702 m)  Wt 122 lb 3.2 oz (55.43 kg)  BMI 19.14 kg/m2 General: The patient is alert oriented, healthy-appearing, does not appear in any distress. Breasts:. She is status post right mastectomy with reconstruction. The skin flaps appear viable and healthy. There is not any erythema to suggest infection. The drain sites are okay with no evidence of infection. The drain fluid is yellow. The posterior incision is healing nicely. Again no evidence of infection.  Data reviewed: The white count today is 21,000. Urinalysis does not appear to have a urinary tract infection but she did start antibiotics yesterday  Impression: Low-grade fever with associated elevated white count of uncertain etiology, now on Cipro  Plan: I cultured the fluid from the JP drains to see if there is any evidence of infection. Will follow up with her tomorrow. If She continues to have fever I will add doxycycline to the regimen. 03/08/2012 7:37 AM  Her culture shows G+ cocci and a few G- rods. Discussed with Dr Sanger - will add Doxy to meds  and she will see tomorrow. Will check with patient later today to see if she needs to be seen sooner. 

## 2012-03-07 NOTE — Patient Instructions (Signed)
Call me tomorrow and Friday to let me know how you are doing

## 2012-03-07 NOTE — Telephone Encounter (Signed)
Patient called in saying she has not felt good past 2-3 days. She has nausea, no vomiting. Fever yesterday was 100.5, today 99.6 and just noticed blood in her urine. Paged Dr. Jamey Ripa. He wants CBC and UA culture.

## 2012-03-08 ENCOUNTER — Telehealth (INDEPENDENT_AMBULATORY_CARE_PROVIDER_SITE_OTHER): Payer: Self-pay | Admitting: General Surgery

## 2012-03-08 MED ORDER — DOXYCYCLINE HYCLATE 100 MG PO TABS: 100.0000 mg | ORAL_TABLET | Freq: Two times a day (BID) | ORAL | Status: DC

## 2012-03-08 NOTE — Addendum Note (Signed)
Addended by: Currie Paris on: 03/08/2012 07:41 AM   Modules accepted: Orders

## 2012-03-08 NOTE — Telephone Encounter (Signed)
Message copied by Liliana Cline on Thu Mar 08, 2012  9:21 AM ------      Message from: Currie Paris      Created: Thu Mar 08, 2012  7:39 AM       Her culture shows both cocci and rods. She needs to stay on cipro and add doxycycline for now. Spoke with Dr Kelly Splinter who is planning to see her tomorrow, I put Rx for doxy in Epic so hopefully went to pharmacy. Let her know it should be there and see how she is doing. If she is having more fever, she may need to be seen again today.

## 2012-03-08 NOTE — Telephone Encounter (Signed)
Patient made aware that doxycycline has been sent to the pharmacy and she needs to start that. She states she is just feeling tired. No energy. She isn't having fevers but she is taking doxycycline which she thinks is suppressing them. She will start doxy with her cipro and see how she feels. We will touch base again tomorrow unless she feels worse throughout the day.

## 2012-03-09 ENCOUNTER — Encounter (INDEPENDENT_AMBULATORY_CARE_PROVIDER_SITE_OTHER): Payer: BC Managed Care – PPO | Admitting: Surgery

## 2012-03-09 NOTE — Progress Notes (Signed)
Pt has had an infection and on cipro and doxycycline-saw dr Jamey Ripa and dr Kelly Splinter Will wait till day before surgery and ck labs-if ok-will be ok for [facility] Told to hydrate well

## 2012-03-11 LAB — BODY FLUID CULTURE
Gram Stain: NONE SEEN
Gram Stain: NONE SEEN

## 2012-03-12 ENCOUNTER — Telehealth: Payer: Self-pay | Admitting: *Deleted

## 2012-03-12 ENCOUNTER — Other Ambulatory Visit: Payer: Self-pay | Admitting: *Deleted

## 2012-03-12 ENCOUNTER — Telehealth (INDEPENDENT_AMBULATORY_CARE_PROVIDER_SITE_OTHER): Payer: Self-pay | Admitting: General Surgery

## 2012-03-12 NOTE — Telephone Encounter (Signed)
Patient made aware PAC insertion is cancelled for Wednesday per Dr Jamey Ripa. Appt made for her to see Dr Jamey Ripa on Friday 03/16/12. She is scheduled to start chemo right now on 03/22/12.

## 2012-03-12 NOTE — Telephone Encounter (Signed)
Message copied by Liliana Cline on Mon Mar 12, 2012  9:06 AM ------      Message from: Currie Paris      Created: Mon Mar 12, 2012  8:00 AM       Please see how she is doing and what Dr Kelly Splinter told her Friday. She is scheduled for port on Wednesday, but with the + culture from drains likely need to cancel. Will decide after we find out what Sanger had to say

## 2012-03-12 NOTE — Telephone Encounter (Signed)
Called and spoke with patient. States she is feeling much better after the second antibiotic was added. She states the drains are draining clear now. She states she is going to have one drain removed by Dr Leonie Green nurse tomorrow and she is scheduled to get labs tomorrow. She states the only thing Dr Kelly Splinter told her Friday was to use benadryl cream on rash on her skin. I told her I would let Dr Jamey Ripa know she is getting labs tomorrow and we would probably base whether she can have port a cath placed on Wednesday on the lab results.

## 2012-03-12 NOTE — Progress Notes (Signed)
Pt called to this RN with concerns and questions regarding treatment - including her concern with post surgery infection and drainage tubes ( which she still has ). She states one will be removed this week and then second tube removed next week " the day before my treatment "-  " I just feel like I need a day without anything in my body to prepare myself for my treatment".  Per conversation appt on 11/20 will be rescheduled to 11/21. Request sent to scheduling.  Per phone discussion other questions were answered including number of treatments ( 4 not 3 as she thought ), general length of treatment including 1st treatments are given at a slower rate, post chemo potential side effects and interventions to lessen side effects.  Overall questions were answered with pt stating thanks for discussion.  No other needs at this time.

## 2012-03-12 NOTE — Telephone Encounter (Signed)
Patient confirmed over the phone the new date but same time for the treatment on 03-22-2012 starting at 9:30am  Patient confirmed over the phone the new date but same time for the injection on 03-23-2012 at 10:45am  Sent email to [place] and tanya to move patient treatment to the 03-22-2012

## 2012-03-13 ENCOUNTER — Ambulatory Visit: Payer: BC Managed Care – PPO

## 2012-03-13 ENCOUNTER — Encounter: Payer: Self-pay | Admitting: *Deleted

## 2012-03-13 ENCOUNTER — Other Ambulatory Visit: Payer: BC Managed Care – PPO

## 2012-03-14 ENCOUNTER — Ambulatory Visit (HOSPITAL_BASED_OUTPATIENT_CLINIC_OR_DEPARTMENT_OTHER): Admission: RE | Admit: 2012-03-14 | Payer: BC Managed Care – PPO | Source: Ambulatory Visit | Admitting: Surgery

## 2012-03-14 ENCOUNTER — Encounter (HOSPITAL_BASED_OUTPATIENT_CLINIC_OR_DEPARTMENT_OTHER): Admission: RE | Payer: Self-pay | Source: Ambulatory Visit

## 2012-03-14 ENCOUNTER — Telehealth (INDEPENDENT_AMBULATORY_CARE_PROVIDER_SITE_OTHER): Payer: Self-pay | Admitting: General Surgery

## 2012-03-14 SURGERY — INSERTION, TUNNELED CENTRAL VENOUS DEVICE, WITH PORT
Anesthesia: General

## 2012-03-14 NOTE — Op Note (Signed)
Christina Daniel, Christina Daniel NO.:  [id number]  MEDICAL RECORD NO.:  [id number]  LOCATION: [facility]          FACILITY:  [facility]  PHYSICIAN:  Wayland Denis, DO      DATE OF BIRTH:  07/25/1950  DATE OF PROCEDURE:  02/22/2012 DATE OF DISCHARGE:  02/22/2012                              OPERATIVE REPORT   PREOPERATIVE DIAGNOSIS:  Right breast cancer.  POSTOPERATIVE DIAGNOSIS:  Right breast cancer.  PROCEDURE:  Immediate right breast reconstruction after mastectomy with latissimus myocutaneous flap and expander placement, expander medium height mentor 275 mL expander.  ATTENDING:  Wayland Denis, DO  ASSISTANT:  Harrie Foreman.  ANESTHESIA:  General.  INDICATION FOR PROCEDURE:  The patient was diagnosed with right breast cancer.  She had been radiated previously due to breast cancer in the past.  A decision was made to do a mastectomy with immediate reconstruction.  There was some concern about the skin being able to close due to a small size and location of the skin lesions.  Risks and complications were reviewed. If there is anything concerning, infection, bleeding, pain, scar, risk of anesthesia, capsular contracture, rupture of expander. The patient wished to proceed.  DESCRIPTION OF PROCEDURE:  The patient was taken to the operating room, placed on the operating room table, and general anesthesia was administered.  General Surgery performed their portion of the case which included right mastectomy and sentinel nodes.  Once the procedure was completed, the patient was rendered to the Plastic Surgery team.  A time- out was called.  All information was confirmed to be correct.  The patient was redraped and the pocket was irrigated with warm saline. Hemostasis was achieved with electrocautery.  The right pectoralis major muscle was lifted off the chest wall to create a pocket for placement of the expander.  Once that was complete, the patient was turned  to the left lateral position.  She had been marked preoperatively for the location of the latissimus muscle flap.  The patient was re-prepped and draped, and a time-out confirmed the information.  A 10 blade was used to make an elliptical incision over the latissimus muscle for creation of the myocutaneous flap.  The 10 blade was used to dissect down to the soft tissue in a graduated fashion away from the flap in order to bring any of the extra soft tissue with the flap.  The Bovie was then used to dissect down to the latissimus muscle and free it from the posterior midline as well as inferiorly and in the midaxial area.  Once the latissimus was freed, confirmation of the pedicle was noted with palpation.  The latissimus was placed within the pocket of the right breast that had been created.  The drains were placed coming out laterally and secured to the skin with 3-0 silk.  Two drains were placed, one superiorly and one inferiorly.  Evicel was then sprayed in the area to help with postop management.  The deep layers were reapproximated with 3-0 Vicryl and then at the fascia layer followed by 4-0 Vicryl and the subcutaneous layer, 5-0 Monocryl running subcuticular suture was utilized for reapproximation of the skin edges.  Dermabond was applied with a Mepitel dressing.  Attention was then turned to the  anterior flap.  The latissimus muscle was tacked to the pectoralis major muscle with 3-0 Monocryl in the superomedial aspect.  The 275 mL expander was then placed under the pectoralis major muscle, and a portion of the tunnel from the back was tacked to the chest wall with 3-0 Monocryl.  The latissimus was then sutured to the inframammary fold using 3-0 Monocryl to secure the position.  A 20 mL of injectable saline was placed in the expander.  The combination of 3-0 and 4-0 Vicryl was used to approximate the deep layers of the mastectomy flap to the latissimus cutaneous flap.  A  5-0 Monocryl was then used to close the skin layer with a running subcuticular closure.  Prior to closure, a drain was placed, brought out at the lateral aspect under the inframammary fold and tacked to the skin with a 3-0 silk.  The flap had excellent capillary refill and color. Dermabond was applied with ABD and a breast binder.  The patient tolerated the procedure well.  She was allowed to wake, extubated and taken to recovery room in stable condition.     Wayland Denis, DO     CS/MEDQ  D:  03/13/2012  T:  03/14/2012  Job:  [id number]

## 2012-03-14 NOTE — Telephone Encounter (Signed)
Message copied by Liliana Cline on Wed Mar 14, 2012  2:59 PM ------      Message from: Isaias Sakai K      Created: Wed Mar 14, 2012  2:07 PM      Regarding: Dr Jamey Ripa      Contact: [phone]       Would like to know what stage br ca she has

## 2012-03-14 NOTE — Telephone Encounter (Signed)
Patient made aware per Dr Jamey Ripa it looks like she is a stage 2 but for her to confirm that with her oncologist.

## 2012-03-16 ENCOUNTER — Encounter: Payer: Self-pay | Admitting: Physician Assistant

## 2012-03-16 ENCOUNTER — Encounter (INDEPENDENT_AMBULATORY_CARE_PROVIDER_SITE_OTHER): Payer: Self-pay | Admitting: Surgery

## 2012-03-16 ENCOUNTER — Telehealth: Payer: Self-pay | Admitting: *Deleted

## 2012-03-16 ENCOUNTER — Ambulatory Visit (HOSPITAL_BASED_OUTPATIENT_CLINIC_OR_DEPARTMENT_OTHER): Payer: BC Managed Care – PPO | Admitting: Physician Assistant

## 2012-03-16 ENCOUNTER — Encounter: Payer: Self-pay | Admitting: Oncology

## 2012-03-16 ENCOUNTER — Ambulatory Visit (INDEPENDENT_AMBULATORY_CARE_PROVIDER_SITE_OTHER): Payer: BC Managed Care – PPO | Admitting: Surgery

## 2012-03-16 VITALS — BP 122/72 | HR 77 | Temp 98.2°F | Resp 18 | Ht 67.0 in | Wt 121.0 lb

## 2012-03-16 VITALS — BP 126/82 | HR 83 | Temp 98.9°F | Resp 20 | Ht 67.0 in | Wt 122.6 lb

## 2012-03-16 DIAGNOSIS — C50519 Malignant neoplasm of lower-outer quadrant of unspecified female breast: Secondary | ICD-10-CM

## 2012-03-16 DIAGNOSIS — C801 Malignant (primary) neoplasm, unspecified: Secondary | ICD-10-CM

## 2012-03-16 DIAGNOSIS — C50911 Malignant neoplasm of unspecified site of right female breast: Secondary | ICD-10-CM

## 2012-03-16 DIAGNOSIS — Z09 Encounter for follow-up examination after completed treatment for conditions other than malignant neoplasm: Secondary | ICD-10-CM

## 2012-03-16 DIAGNOSIS — C50119 Malignant neoplasm of central portion of unspecified female breast: Secondary | ICD-10-CM

## 2012-03-16 DIAGNOSIS — Z853 Personal history of malignant neoplasm of breast: Secondary | ICD-10-CM

## 2012-03-16 MED ORDER — TOBRAMYCIN-DEXAMETHASONE 0.3-0.1 % OP SUSP: 1.0000 [drp] | Freq: Two times a day (BID) | OPHTHALMIC | Status: DC

## 2012-03-16 MED ORDER — LORAZEPAM 0.5 MG PO TABS: 0.5000 mg | ORAL_TABLET | Freq: Every evening | ORAL | Status: DC | PRN

## 2012-03-16 MED ORDER — DEXAMETHASONE 4 MG PO TABS: 8.0000 mg | ORAL_TABLET | Freq: Two times a day (BID) | ORAL | Status: DC

## 2012-03-16 MED ORDER — PROCHLORPERAZINE MALEATE 10 MG PO TABS: 10.0000 mg | ORAL_TABLET | Freq: Three times a day (TID) | ORAL | Status: DC

## 2012-03-16 MED ORDER — LIDOCAINE-PRILOCAINE 2.5-2.5 % EX CREA: TOPICAL_CREAM | CUTANEOUS | Status: DC | PRN
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
## 2012-03-16 NOTE — Progress Notes (Signed)
Christina Daniel    [id number] 03/16/2012    [birth year]   CC: Post op mastectomy with lat flap reconstruction  HPI: The patient returns for post op follow-up. She underwent a right mastectomy with reconstruction  on 02/22/12. Over all she feels that she is doing well. Last visit she had fever, hematuria and elevated wbs. Treated first with Cipro, but culture of drain fluid had staph so Doxy added. She improved within two days of starting doxy. She still has one drain in and will see Dr Kelly Splinter next week to get that out.   PE: VITAL SIGNS: BP 122/72  Pulse 77  Temp 98.2 F (36.8 C) (Oral)  Resp 18  Ht 5\' 7"  (1.702 m)  Wt 121 lb (54.885 kg)  BMI 18.95 kg/m2  The incision is healing nicely and there is no evidence of infection or hematoma.  The drain has almost stopped  DATA REVIEWED: No new data  IMPRESSION: Patient doing well. Will need port at some point > to see GM today to decided timing of chemo and port palcement  PLAN: Her next visit will be after port placed

## 2012-03-16 NOTE — Telephone Encounter (Signed)
CANCEL ALL current appts, except for Genetics with labs on 11/25; Dr. Jamey Ripa 1st week of Dec for PORT instertion; lab, AB and 1st [facility] 12/11; lab and AB 12/18 and 12/31; Infusion for [facility] 1/2; lab and [facility] 05/09/12; lab, AB and [facility] 1/22; injections 12/12, 1/3, and 1/23  Sent michelle email to set up patient's treatment  Per patient requested to move her genetics counseling appointment to 06-2012

## 2012-03-16 NOTE — Progress Notes (Signed)
Enrolled pt in the Neulasta First Step program. I will fax form and activate card today.

## 2012-03-16 NOTE — Patient Instructions (Signed)
Call when you know the dates for chemo so we can plan to get a port placed

## 2012-03-20 ENCOUNTER — Other Ambulatory Visit (INDEPENDENT_AMBULATORY_CARE_PROVIDER_SITE_OTHER): Payer: Self-pay | Admitting: Surgery

## 2012-03-21 ENCOUNTER — Ambulatory Visit: Payer: BC Managed Care – PPO

## 2012-03-21 ENCOUNTER — Other Ambulatory Visit: Payer: BC Managed Care – PPO | Admitting: Lab

## 2012-03-22 ENCOUNTER — Other Ambulatory Visit: Payer: BC Managed Care – PPO | Admitting: Lab

## 2012-03-22 ENCOUNTER — Ambulatory Visit: Payer: BC Managed Care – PPO

## 2012-03-22 ENCOUNTER — Ambulatory Visit: Payer: Self-pay

## 2012-03-23 ENCOUNTER — Ambulatory Visit: Payer: BC Managed Care – PPO

## 2012-03-23 ENCOUNTER — Telehealth: Payer: Self-pay | Admitting: Oncology

## 2012-03-23 NOTE — Telephone Encounter (Signed)
Mailed the pt her dec-feb 2014 appt calendars at her request

## 2012-03-26 ENCOUNTER — Encounter: Payer: BC Managed Care – PPO | Admitting: Genetic Counselor

## 2012-03-26 ENCOUNTER — Other Ambulatory Visit: Payer: BC Managed Care – PPO | Admitting: Lab

## 2012-03-26 ENCOUNTER — Telehealth: Payer: Self-pay | Admitting: Oncology

## 2012-03-26 NOTE — Progress Notes (Signed)
Pt was sick-surgery was rescheduled To come in for bmet

## 2012-03-26 NOTE — Telephone Encounter (Signed)
Mailed the pt her dec-march 2014 appt calendars

## 2012-03-27 ENCOUNTER — Encounter (HOSPITAL_BASED_OUTPATIENT_CLINIC_OR_DEPARTMENT_OTHER)
Admission: RE | Admit: 2012-03-27 | Discharge: 2012-03-27 | Disposition: A | Discharge status: Home / Self Care | Payer: BC Managed Care – PPO | Source: Ambulatory Visit | Attending: Surgery | Admitting: Surgery

## 2012-03-27 LAB — BASIC METABOLIC PANEL
BUN: 15 mg/dL (ref 6–23)
CO2: 25 mEq/L (ref 19–32)
Chloride: 102 mEq/L (ref 96–112)
Creatinine, Ser: 0.52 mg/dL (ref 0.50–1.10)
GFR calc Af Amer: >90 mL/min (ref >90)

## 2012-03-28 ENCOUNTER — Other Ambulatory Visit: Payer: BC Managed Care – PPO | Admitting: Lab

## 2012-03-28 ENCOUNTER — Telehealth (INDEPENDENT_AMBULATORY_CARE_PROVIDER_SITE_OTHER): Payer: Self-pay | Admitting: General Surgery

## 2012-03-28 MED ORDER — POTASSIUM CHLORIDE ER 10 MEQ PO TBCR: 10.0000 meq | EXTENDED_RELEASE_TABLET | Freq: Two times a day (BID) | ORAL | Status: DC

## 2012-03-28 NOTE — Telephone Encounter (Signed)
Potassium 10 MeQ e-pescribed to pharmacy.

## 2012-03-28 NOTE — Telephone Encounter (Signed)
Patient made aware of lab results. She will get potassium and supplement until surgery. I offered to call in RX but she states she will get over the counter. She will call with any questions.

## 2012-03-28 NOTE — Addendum Note (Signed)
Addended byLiliana Cline on: 03/28/2012 12:48 PM   Modules accepted: Orders

## 2012-03-28 NOTE — Telephone Encounter (Signed)
Message copied by Liliana Cline on Wed Mar 28, 2012  9:20 AM ------      Message from: Currie Paris      Created: Wed Mar 28, 2012  7:31 AM       Lesly Rubenstein - she needs to take some supplemental Potassium for a few days. Don't know if she has some she takes, or could take some K-Dur 10 Meq tid till surgery

## 2012-04-02 ENCOUNTER — Ambulatory Visit (HOSPITAL_COMMUNITY): Payer: BC Managed Care – PPO

## 2012-04-02 ENCOUNTER — Encounter (HOSPITAL_BASED_OUTPATIENT_CLINIC_OR_DEPARTMENT_OTHER): Payer: Self-pay

## 2012-04-02 ENCOUNTER — Encounter (HOSPITAL_BASED_OUTPATIENT_CLINIC_OR_DEPARTMENT_OTHER): Payer: Self-pay | Admitting: Certified Registered Nurse Anesthetist

## 2012-04-02 ENCOUNTER — Ambulatory Visit (HOSPITAL_BASED_OUTPATIENT_CLINIC_OR_DEPARTMENT_OTHER)
Admission: RE | Admit: 2012-04-02 | Discharge: 2012-04-02 | Disposition: A | Discharge status: Home / Self Care | Payer: BC Managed Care – PPO | Source: Ambulatory Visit | Attending: Surgery | Admitting: Surgery

## 2012-04-02 ENCOUNTER — Ambulatory Visit (HOSPITAL_BASED_OUTPATIENT_CLINIC_OR_DEPARTMENT_OTHER): Payer: BC Managed Care – PPO | Admitting: Certified Registered Nurse Anesthetist

## 2012-04-02 ENCOUNTER — Encounter (HOSPITAL_BASED_OUTPATIENT_CLINIC_OR_DEPARTMENT_OTHER)
Admission: RE | Disposition: A | Discharge status: Home / Self Care | Payer: Self-pay | Source: Ambulatory Visit | Attending: Surgery

## 2012-04-02 DIAGNOSIS — Z9010 Acquired absence of unspecified breast and nipple: Secondary | ICD-10-CM | POA: Insufficient documentation

## 2012-04-02 DIAGNOSIS — K219 Gastro-esophageal reflux disease without esophagitis: Secondary | ICD-10-CM | POA: Insufficient documentation

## 2012-04-02 DIAGNOSIS — F329 Major depressive disorder, single episode, unspecified: Secondary | ICD-10-CM | POA: Insufficient documentation

## 2012-04-02 DIAGNOSIS — C50919 Malignant neoplasm of unspecified site of unspecified female breast: Secondary | ICD-10-CM

## 2012-04-02 DIAGNOSIS — F3289 Other specified depressive episodes: Secondary | ICD-10-CM | POA: Insufficient documentation

## 2012-04-02 HISTORY — PX: PORTACATH PLACEMENT: SHX2246

## 2012-04-02 LAB — POCT HEMOGLOBIN-HEMACUE: Hemoglobin: 13 g/dL (ref 12.0–15.0)

## 2012-04-02 SURGERY — INSERTION, TUNNELED CENTRAL VENOUS DEVICE, WITH PORT
Anesthesia: General | Site: Chest | Laterality: Left | Wound class: Clean

## 2012-04-02 MED ORDER — CHLORHEXIDINE GLUCONATE 4 % EX LIQD: 1.0000 "application " | Freq: Once | CUTANEOUS | Status: DC

## 2012-04-02 MED ORDER — CEFAZOLIN SODIUM-DEXTROSE 2-3 GM-% IV SOLR
2.0000 g | INTRAVENOUS | Status: AC
  Administered 2012-04-02: 2 g via INTRAVENOUS

## 2012-04-02 MED ORDER — FENTANYL CITRATE 0.05 MG/ML IJ SOLN
INTRAMUSCULAR | Status: DC | PRN
  Administered 2012-04-02: 50 ug via INTRAVENOUS

## 2012-04-02 MED ORDER — OXYCODONE HCL 5 MG PO TABS: 5.0000 mg | ORAL_TABLET | Freq: Once | ORAL | Status: DC | PRN

## 2012-04-02 MED ORDER — HEPARIN SOD (PORK) LOCK FLUSH 100 UNIT/ML IV SOLN
INTRAVENOUS | Status: DC | PRN
  Administered 2012-04-02: 500 [IU] via INTRAVENOUS

## 2012-04-02 MED ORDER — LIDOCAINE HCL (CARDIAC) 20 MG/ML IV SOLN
INTRAVENOUS | Status: DC | PRN
  Administered 2012-04-02: 60 mg via INTRAVENOUS

## 2012-04-02 MED ORDER — OXYCODONE HCL 5 MG/5ML PO SOLN: 5.0000 mg | Freq: Once | ORAL | Status: DC | PRN

## 2012-04-02 MED ORDER — MIDAZOLAM HCL 2 MG/2ML IJ SOLN: 0.5000 mg | Freq: Once | INTRAMUSCULAR | Status: DC | PRN

## 2012-04-02 MED ORDER — LACTATED RINGERS IV SOLN
INTRAVENOUS | Status: DC
  Administered 2012-04-02 (×2): via INTRAVENOUS

## 2012-04-02 MED ORDER — ONDANSETRON HCL 4 MG/2ML IJ SOLN
INTRAMUSCULAR | Status: DC | PRN
  Administered 2012-04-02: 4 mg via INTRAVENOUS

## 2012-04-02 MED ORDER — HEPARIN (PORCINE) IN NACL 2-0.9 UNIT/ML-% IJ SOLN
INTRAMUSCULAR | Status: DC | PRN
  Administered 2012-04-02: 1 via INTRAVENOUS

## 2012-04-02 MED ORDER — MEPERIDINE HCL 25 MG/ML IJ SOLN: 6.2500 mg | INTRAMUSCULAR | Status: DC | PRN

## 2012-04-02 MED ORDER — PROPOFOL 10 MG/ML IV BOLUS
INTRAVENOUS | Status: DC | PRN
  Administered 2012-04-02: 150 mg via INTRAVENOUS

## 2012-04-02 MED ORDER — FENTANYL CITRATE 0.05 MG/ML IJ SOLN
25.0000 ug | INTRAMUSCULAR | Status: DC | PRN
  Administered 2012-04-02 (×2): 25 ug via INTRAVENOUS

## 2012-04-02 MED ORDER — CEFAZOLIN SODIUM-DEXTROSE 2-3 GM-% IV SOLR: 2.0000 g | INTRAVENOUS | Status: DC

## 2012-04-02 MED ORDER — DEXAMETHASONE SODIUM PHOSPHATE 4 MG/ML IJ SOLN
INTRAMUSCULAR | Status: DC | PRN
  Administered 2012-04-02: 10 mg via INTRAVENOUS

## 2012-04-02 MED ORDER — BUPIVACAINE HCL (PF) 0.25 % IJ SOLN
INTRAMUSCULAR | Status: DC | PRN
  Administered 2012-04-02: 14 mL

## 2012-04-02 MED ORDER — PROMETHAZINE HCL 25 MG/ML IJ SOLN: 6.2500 mg | INTRAMUSCULAR | Status: DC | PRN

## 2012-04-02 SURGICAL SUPPLY — 53 items
ADH SKN CLS APL DERMABOND .7 (GAUZE/BANDAGES/DRESSINGS) ×1
APL SKNCLS STERI-STRIP NONHPOA (GAUZE/BANDAGES/DRESSINGS)
BAG DECANTER FOR FLEXI CONT (MISCELLANEOUS) ×2 IMPLANT
BENZOIN TINCTURE PRP APPL 2/3 (GAUZE/BANDAGES/DRESSINGS) IMPLANT
BLADE HEX COATED 2.75 (ELECTRODE) ×2 IMPLANT
BLADE SURG 15 STRL LF DISP TIS (BLADE) ×1 IMPLANT
BLADE SURG 15 STRL SS (BLADE) ×2
CANISTER SUCTION 1200CC (MISCELLANEOUS) IMPLANT
CHLORAPREP W/TINT 26ML (MISCELLANEOUS) ×2 IMPLANT
CLOTH BEACON ORANGE TIMEOUT ST (SAFETY) ×2 IMPLANT
COVER MAYO STAND STRL (DRAPES) ×2 IMPLANT
COVER TABLE BACK 60X90 (DRAPES) ×2 IMPLANT
DECANTER SPIKE VIAL GLASS SM (MISCELLANEOUS) ×2 IMPLANT
DERMABOND ADVANCED (GAUZE/BANDAGES/DRESSINGS) ×1
DERMABOND ADVANCED .7 DNX12 (GAUZE/BANDAGES/DRESSINGS) IMPLANT
DRAPE C-ARM 42X72 X-RAY (DRAPES) ×2 IMPLANT
DRAPE LAPAROTOMY TRNSV 102X78 (DRAPE) ×2 IMPLANT
DRAPE UTILITY XL STRL (DRAPES) ×2 IMPLANT
ELECT REM PT RETURN 9FT ADLT (ELECTROSURGICAL) ×2
ELECTRODE REM PT RTRN 9FT ADLT (ELECTROSURGICAL) ×1 IMPLANT
GLOVE EUDERMIC 7 POWDERFREE (GLOVE) ×1 IMPLANT
GLOVE SKINSENSE NS SZ6.5 (GLOVE) ×3
GLOVE SKINSENSE NS SZ7.0 (GLOVE) ×1
GLOVE SKINSENSE STRL SZ6.5 (GLOVE) IMPLANT
GLOVE SKINSENSE STRL SZ7.0 (GLOVE) IMPLANT
GOWN PREVENTION PLUS XLARGE (GOWN DISPOSABLE) ×5 IMPLANT
IV CATH PLACEMENT UNIT 16 GA (IV SOLUTION) IMPLANT
IV HEPARIN 1000UNITS/500ML (IV SOLUTION) ×2 IMPLANT
IV KIT MINILOC 20X1 SAFETY (NEEDLE) IMPLANT
KIT BARDPORT ISP (Port) IMPLANT
KIT PORT POWER 8FR ISP CVUE (Catheter) IMPLANT
KIT PORT POWER ISP 8FR (Catheter) IMPLANT
KIT POWER PORT SLIM 6FR (PORTABLE EQUIPMENT SUPPLIES) ×1 IMPLANT
NDL HYPO 25X1 1.5 SAFETY (NEEDLE) ×1 IMPLANT
NEEDLE HYPO 25X1 1.5 SAFETY (NEEDLE) ×2 IMPLANT
PACK BASIN DAY SURGERY FS (CUSTOM PROCEDURE TRAY) ×2 IMPLANT
PENCIL BUTTON HOLSTER BLD 10FT (ELECTRODE) ×2 IMPLANT
SET SHEATH INTRODUCER 10FR (MISCELLANEOUS) IMPLANT
SHEATH COOK PEEL AWAY SET 9F (SHEATH) IMPLANT
SHEET MEDIUM DRAPE 40X70 STRL (DRAPES) IMPLANT
SLEEVE SCD COMPRESS KNEE MED (MISCELLANEOUS) ×2 IMPLANT
STAPLER VISISTAT (STAPLE) IMPLANT
STRIP CLOSURE SKIN 1/2X4 (GAUZE/BANDAGES/DRESSINGS) IMPLANT
SUT MNCRL AB 4-0 PS2 18 (SUTURE) ×2 IMPLANT
SUT PROLENE 2 0 SH DA (SUTURE) ×2 IMPLANT
SUT VICRYL 3-0 CR8 SH (SUTURE) ×2 IMPLANT
SYR 5ML LUER SLIP (SYRINGE) ×2 IMPLANT
SYR CONTROL 10ML LL (SYRINGE) ×2 IMPLANT
TOWEL OR 17X24 6PK STRL BLUE (TOWEL DISPOSABLE) ×4 IMPLANT
TOWEL OR NON WOVEN STRL DISP B (DISPOSABLE) ×1 IMPLANT
TUBE CONNECTING 20X1/4 (TUBING) IMPLANT
WATER STERILE IRR 1000ML POUR (IV SOLUTION) ×1 IMPLANT
YANKAUER SUCT BULB TIP NO VENT (SUCTIONS) IMPLANT

## 2012-04-02 NOTE — Anesthesia Postprocedure Evaluation (Signed)
  Anesthesia Post-op Note  Patient: Christina Daniel  Procedure(s) Performed: Procedure(s) (LRB) with comments: INSERTION PORT-A-CATH (Left) - Port-A-Cath Placement  Patient Location: PACU  Anesthesia Type:General  Level of Consciousness: awake, alert , oriented and patient cooperative  Airway and Oxygen Therapy: Patient Spontanous Breathing  Post-op Pain: none  Post-op Assessment: Post-op Vital signs reviewed, Patient's Cardiovascular Status Stable, Respiratory Function Stable, Patent Airway, No signs of Nausea or vomiting, Adequate PO intake and Pain level controlled  Post-op Vital Signs: Reviewed and stable  Complications: No apparent anesthesia complications

## 2012-04-02 NOTE — Transfer of Care (Signed)
Immediate Anesthesia Transfer of Care Note  Patient: Christina Daniel  Procedure(s) Performed: Procedure(s) (LRB) with comments: INSERTION PORT-A-CATH (Left) - Port-A-Cath Placement  Patient Location: PACU  Anesthesia Type:General  Level of Consciousness: awake, sedated and patient cooperative  Airway & Oxygen Therapy: Patient Spontanous Breathing and Patient connected to face mask oxygen  Post-op Assessment: Report given to PACU RN and Post -op Vital signs reviewed and stable  Post vital signs: Reviewed and stable  Complications: No apparent anesthesia complications

## 2012-04-02 NOTE — Op Note (Signed)
Christina Daniel [birth year] [id number] 03/21/2012  Preoperative diagnosis: PAC needed  Postoperative diagnosis: Same  Procedure: Portacath Placement  Surgeon: Currie Paris, MD, FACS  Anesthesia: General  Clinical History and Indications: The patient is getting ready to begin chemotherapy for her cancer. She  needs a Port-A-Cath for venous access.  Description of Procedure: I have seen the patient in the holding area and confirmed the plans for the procedure as noted above. I reviewed the risks and complications again and the patient has no further questions. She wishes to proceed.   The patient was then taken to the operating room. After satisfactory gneral anesthesia had been obtained the upper chest and lower neck were prepped and draped as a sterile field. The timeout was done.  The left subclavian vein was entered on the initial attempt and the guidewire threaded into the superior vena cava right atrial area under fluoroscopic guidance. An incision was then made on the anterior chest wall and a subcutaneous pocket fashioned for the port reservoir.  The port tubing was then brought through a subcutaneous tunnel from the port site to the guidewire site. The dilator and peel-away sheath were then advanced over the guidewire while monitoring this with fluoroscopy. The guidewire and dilator were removed and the tubing threaded to approximately 22 cm. The peel-away sheath was then removed. The catheter aspirated and flushed easily. Using fluoroscopy the tip was backed out into the superior vena cava right atrial junction area, 19 cm from the skin. It aspirated and flushed easily. The reservoir was attached and the locking mechanism engaged. That aspirated and flushed easily.  The reservoir was secured to the fascia with 2 sutures of 2-0 Prolene. A final check with fluoroscopy was done to make sure we had no kinks and good positioning of the tip of the catheter. Everything appeared to be  okay. The catheter was aspirated, flushed with dilute heparin and then concentrated aqueous heparin.  The incision was then closed with interrupted 3-0 Vicryl, and 4-0 Monocryl subcuticular with Dermabond on the skin.  There were no operative complications. Estimated blood loss was minimal. All counts were correct. The patient tolerated the procedure well.  Currie Paris, MD, FACS 04/02/2012 10:09 AM

## 2012-04-02 NOTE — Interval H&P Note (Signed)
History and Physical Interval Note:  04/02/2012 9:05 AM  Christina Daniel  has presented today for surgery, with the diagnosis of breast cancer  The various methods of treatment have been discussed with the patient and family. After consideration of risks, benefits and other options for treatment, the patient has consented to  Procedure(s) (LRB) with comments: INSERTION PORT-A-CATH (N/A) - Port-A-Cath Placement as a surgical intervention .  The patient's history has been reviewed, patient examined, no change in status, stable for surgery.  I have reviewed the patient's chart and labs.  Questions were answered to the patient's satisfaction.   On exam today: VS: BP 130/82  Pulse 62  Temp 98.4 F (36.9 C) (Oral)  Resp 20  Ht 5\' 7"  (1.702 m)  Wt 122 lb 14.4 oz (55.747 kg)  BMI 19.25 kg/m2  SpO2 99% Gen: Alert and NAD Lungs: Clear Heart: reg rhythm and no M,R G  Christina Daniel J

## 2012-04-02 NOTE — H&P (View-Only) (Signed)
Chief complaint: Low-grade fever, change in the quality of the Jackson-Pratt drainage, and urinary tract symptoms  History of present illness: This patient is about two-week status post a right total mastectomy with latissimus flap reconstruction. She's been doing well. She was seen by her plastic surgeon yesterday and one drain removed. She started feeling bad sometimes after that. She did reports that she had a little blood in her urine yesterday. She noticed that the character of the drainage changed from a pink Kool-Aid to a more yellowish cloudy material. She's not had any changes in the symptoms around her incision but continues to have pain around the posterior incision from the latissimus flap.she called her plastic surgeon last night and was started on some Cipro she is now taken for 2 doses.  Exam: Vital signs:BP 106/60  Pulse 112  Temp 98.9 F (37.2 C) (Oral)  Resp 18  Ht 5\' 7"  (1.702 m)  Wt 122 lb 3.2 oz (55.43 kg)  BMI 19.14 kg/m2 General: The patient is alert oriented, healthy-appearing, does not appear in any distress. Breasts:. She is status post right mastectomy with reconstruction. The skin flaps appear viable and healthy. There is not any erythema to suggest infection. The drain sites are okay with no evidence of infection. The drain fluid is yellow. The posterior incision is healing nicely. Again no evidence of infection.  Data reviewed: The white count today is 21,000. Urinalysis does not appear to have a urinary tract infection but she did start antibiotics yesterday  Impression: Low-grade fever with associated elevated white count of uncertain etiology, now on Cipro  Plan: I cultured the fluid from the JP drains to see if there is any evidence of infection. Will follow up with her tomorrow. If She continues to have fever I will add doxycycline to the regimen. 03/08/2012 7:37 AM  Her culture shows G+ cocci and a few G- rods. Discussed with Dr Kelly Splinter - will add Doxy to meds  and she will see tomorrow. Will check with patient later today to see if she needs to be seen sooner.

## 2012-04-02 NOTE — Anesthesia Procedure Notes (Signed)
Procedure Name: LMA Insertion Date/Time: 04/02/2012 9:34 AM Performed by: Mallissa Lorenzen D Pre-anesthesia Checklist: Patient identified, Emergency Drugs available, Suction available and Patient being monitored Patient Re-evaluated:Patient Re-evaluated prior to inductionOxygen Delivery Method: Circle System Utilized Preoxygenation: Pre-oxygenation with 100% oxygen Intubation Type: IV induction Ventilation: Mask ventilation without difficulty LMA: LMA inserted LMA Size: 4.0 Number of attempts: 1 Airway Equipment and Method: bite block Placement Confirmation: positive ETCO2 Tube secured with: Tape Dental Injury: Teeth and Oropharynx as per pre-operative assessment

## 2012-04-02 NOTE — Anesthesia Preprocedure Evaluation (Signed)
Anesthesia Evaluation  Patient identified by MRN, date of birth, ID band Patient awake    Reviewed: Allergy & Precautions, H&P , NPO status , Patient's Chart, lab work & pertinent test results  History of Anesthesia Complications Negative for: history of anesthetic complications  Airway Mallampati: I TM Distance: >3 FB Neck ROM: Full    Dental No notable dental hx. (+) Teeth Intact and Dental Advisory Given   Pulmonary neg pulmonary ROS,  breath sounds clear to auscultation  Pulmonary exam normal       Cardiovascular - dysrhythmias Rhythm:Regular Rate:Normal  '12 ECHO: normal LVF, EF 55-60%   Neuro/Psych PSYCHIATRIC DISORDERS Depression negative neurological ROS     GI/Hepatic Neg liver ROS, GERD-  Controlled,  Endo/Other  negative endocrine ROS  Renal/GU negative Renal ROS     Musculoskeletal   Abdominal   Peds  Hematology   Anesthesia Other Findings Breast cancer  Reproductive/Obstetrics                           Anesthesia Physical Anesthesia Plan  ASA: III  Anesthesia Plan: General   Post-op Pain Management:    Induction: Intravenous  Airway Management Planned: LMA  Additional Equipment:   Intra-op Plan:   Post-operative Plan:   Informed Consent: I have reviewed the patients History and Physical, chart, labs and discussed the procedure including the risks, benefits and alternatives for the proposed anesthesia with the patient or authorized representative who has indicated his/her understanding and acceptance.   Dental advisory given  Plan Discussed with: CRNA and Surgeon  Anesthesia Plan Comments: (Plan routine monitors, GA- LMA OK)        Anesthesia Quick Evaluation

## 2012-04-03 ENCOUNTER — Telehealth (INDEPENDENT_AMBULATORY_CARE_PROVIDER_SITE_OTHER): Payer: Self-pay | Admitting: General Surgery

## 2012-04-03 ENCOUNTER — Encounter (HOSPITAL_BASED_OUTPATIENT_CLINIC_OR_DEPARTMENT_OTHER): Payer: Self-pay | Admitting: Surgery

## 2012-04-03 ENCOUNTER — Telehealth: Payer: Self-pay | Admitting: *Deleted

## 2012-04-03 NOTE — Telephone Encounter (Signed)
Pt called asking where to apply pre chemo cream? I instructed her to apply it over her porta-cath incision site where she can eel porta-cath/gy

## 2012-04-04 ENCOUNTER — Ambulatory Visit: Payer: BC Managed Care – PPO | Admitting: Physician Assistant

## 2012-04-04 ENCOUNTER — Other Ambulatory Visit: Payer: BC Managed Care – PPO | Admitting: Lab

## 2012-04-10 ENCOUNTER — Other Ambulatory Visit: Payer: Self-pay | Admitting: Emergency Medicine

## 2012-04-11 ENCOUNTER — Ambulatory Visit: Payer: BC Managed Care – PPO | Admitting: Physician Assistant

## 2012-04-11 ENCOUNTER — Other Ambulatory Visit (HOSPITAL_BASED_OUTPATIENT_CLINIC_OR_DEPARTMENT_OTHER): Payer: BC Managed Care – PPO | Admitting: Lab

## 2012-04-11 ENCOUNTER — Ambulatory Visit (HOSPITAL_BASED_OUTPATIENT_CLINIC_OR_DEPARTMENT_OTHER): Payer: BC Managed Care – PPO

## 2012-04-11 ENCOUNTER — Ambulatory Visit (HOSPITAL_BASED_OUTPATIENT_CLINIC_OR_DEPARTMENT_OTHER): Payer: BC Managed Care – PPO | Admitting: Oncology

## 2012-04-11 ENCOUNTER — Other Ambulatory Visit: Payer: BC Managed Care – PPO | Admitting: Lab

## 2012-04-11 ENCOUNTER — Telehealth: Payer: Self-pay | Admitting: *Deleted

## 2012-04-11 ENCOUNTER — Other Ambulatory Visit: Payer: Self-pay | Admitting: Oncology

## 2012-04-11 VITALS — BP 149/88 | HR 96 | Temp 98.2°F | Resp 20 | Ht 67.0 in | Wt 123.5 lb

## 2012-04-11 DIAGNOSIS — C50911 Malignant neoplasm of unspecified site of right female breast: Secondary | ICD-10-CM

## 2012-04-11 DIAGNOSIS — C50519 Malignant neoplasm of lower-outer quadrant of unspecified female breast: Secondary | ICD-10-CM

## 2012-04-11 DIAGNOSIS — C50119 Malignant neoplasm of central portion of unspecified female breast: Secondary | ICD-10-CM

## 2012-04-11 DIAGNOSIS — Z5112 Encounter for antineoplastic immunotherapy: Secondary | ICD-10-CM

## 2012-04-11 DIAGNOSIS — C801 Malignant (primary) neoplasm, unspecified: Secondary | ICD-10-CM

## 2012-04-11 LAB — CBC WITH DIFFERENTIAL/PLATELET
BASO%: 0.1 % (ref 0.0–2.0)
EOS%: 0.1 % (ref 0.0–7.0)
MCH: 30.8 pg (ref 25.1–34.0)
MCHC: 33.6 g/dL (ref 31.5–36.0)
RDW: 13.8 % (ref 11.2–14.5)
lymph#: 1 10*3/uL (ref 0.9–3.3)

## 2012-04-11 MED ORDER — DEXAMETHASONE SODIUM PHOSPHATE 10 MG/ML IJ SOLN
10.0000 mg | Freq: Once | INTRAMUSCULAR | Status: AC
  Administered 2012-04-11: 10 mg via INTRAVENOUS

## 2012-04-11 MED ORDER — PACLITAXEL PROTEIN-BOUND CHEMO INJECTION 100 MG
100.0000 mg/m2 | Freq: Once | INTRAVENOUS | Status: AC
  Administered 2012-04-11: 175 mg via INTRAVENOUS
  Filled 2012-04-11: qty 35

## 2012-04-11 MED ORDER — ONDANSETRON 8 MG/50ML IVPB (CHCC)
8.0000 mg | Freq: Once | INTRAVENOUS | Status: AC
  Administered 2012-04-11: 8 mg via INTRAVENOUS

## 2012-04-11 MED ORDER — HEPARIN SOD (PORK) LOCK FLUSH 100 UNIT/ML IV SOLN
500.0000 [IU] | Freq: Once | INTRAVENOUS | Status: AC | PRN
  Administered 2012-04-11: 500 [IU]
  Filled 2012-04-11: qty 5

## 2012-04-11 MED ORDER — SODIUM CHLORIDE 0.9 % IJ SOLN
10.0000 mL | INTRAMUSCULAR | Status: DC | PRN
  Administered 2012-04-11: 10 mL
  Filled 2012-04-11: qty 10

## 2012-04-11 MED ORDER — ACETAMINOPHEN 325 MG PO TABS
650.0000 mg | ORAL_TABLET | Freq: Once | ORAL | Status: AC
  Administered 2012-04-11: 650 mg via ORAL

## 2012-04-11 MED ORDER — DIPHENHYDRAMINE HCL 25 MG PO CAPS
25.0000 mg | ORAL_CAPSULE | Freq: Once | ORAL | Status: AC
  Administered 2012-04-11: 50 mg via ORAL

## 2012-04-11 MED ORDER — TRASTUZUMAB CHEMO INJECTION 440 MG
6.0000 mg/kg | Freq: Once | INTRAVENOUS | Status: AC
  Administered 2012-04-11: 336 mg via INTRAVENOUS
  Filled 2012-04-11: qty 16

## 2012-04-11 MED ORDER — SODIUM CHLORIDE 0.9 % IV SOLN: Freq: Once | INTRAVENOUS | Status: DC
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
## 2012-04-11 NOTE — Telephone Encounter (Signed)
chemo (abraxane herceptin) 12/11, 12/18, 12/26 and Jan 8; see berry 12/18; see GM 1/8; labs all treatments  Sent michelle email to set up patient's treatment

## 2012-04-11 NOTE — Patient Instructions (Addendum)
[facility] Discharge Instructions for Patients Receiving Chemotherapy  Today you received the following chemotherapy agents: Abraxane and [name].  To help prevent nausea and vomiting after your treatment, we encourage you to take your nausea medication.  If you develop nausea and vomiting that is not controlled by your nausea medication, call the clinic.   BELOW ARE SYMPTOMS THAT SHOULD BE REPORTED IMMEDIATELY:  *FEVER GREATER THAN 100.5 F  *CHILLS WITH OR WITHOUT FEVER  NAUSEA AND VOMITING THAT IS NOT CONTROLLED WITH YOUR NAUSEA MEDICATION  *UNUSUAL SHORTNESS OF BREATH  *UNUSUAL BRUISING OR BLEEDING  TENDERNESS IN MOUTH AND THROAT WITH OR WITHOUT PRESENCE OF ULCERS  *URINARY PROBLEMS  *BOWEL PROBLEMS  UNUSUAL RASH Items with * indicate a potential emergency and should be followed up as soon as possible.  One of the nurses will contact you 24 hours after your treatment. Please let the nurse know about any problems that you may have experienced. Feel free to call the clinic you have any questions or concerns. The clinic phone number is [phone].

## 2012-04-11 NOTE — Telephone Encounter (Signed)
Per staff message and POF I have scheduled appts.  [name]  

## 2012-04-12 ENCOUNTER — Telehealth: Payer: Self-pay | Admitting: *Deleted

## 2012-04-12 ENCOUNTER — Ambulatory Visit: Payer: BC Managed Care – PPO

## 2012-04-12 NOTE — Telephone Encounter (Signed)
Spoke with Christina Daniel after receiving her first treatment.  She is doing well and asking herself did she receive chemotherapy.  Denies any side effects and is eating and drinking well.  Asked my opinion about miralax.  Reports bowels moved December 10th and haven't moved again.  Denies passing gas.  Reports voiding a lot and is drinking.  Reviewed ways to take miralax adding that this is something our providers have suggested for use.  Christina Daniel asked how other patient's receiving the type of treatment she received fare.  Asked if abraxane causes hair loss and will she experience any side effects.  Spent sisxteen minutes answering questions.  No further questions.

## 2012-04-12 NOTE — Telephone Encounter (Signed)
Message copied by Augusto Garbe on Thu Apr 12, 2012  1:57 PM ------      Message from: Barbara Cower      Created: Wed Apr 11, 2012  5:22 PM      Contact: [phone]       1st Abraxane, [name]            Dr. Darnelle Catalan

## 2012-04-13 ENCOUNTER — Encounter: Payer: Self-pay | Admitting: Oncology

## 2012-04-13 NOTE — Progress Notes (Signed)
Returned pt's phone call.  Left msg to return my call.  °

## 2012-04-18 ENCOUNTER — Telehealth: Payer: Self-pay | Admitting: *Deleted

## 2012-04-18 ENCOUNTER — Encounter: Payer: Self-pay | Admitting: Physician Assistant

## 2012-04-18 ENCOUNTER — Encounter: Payer: Self-pay | Admitting: Oncology

## 2012-04-18 ENCOUNTER — Ambulatory Visit (HOSPITAL_BASED_OUTPATIENT_CLINIC_OR_DEPARTMENT_OTHER): Payer: BC Managed Care – PPO

## 2012-04-18 ENCOUNTER — Ambulatory Visit: Payer: BC Managed Care – PPO | Admitting: Physician Assistant

## 2012-04-18 ENCOUNTER — Ambulatory Visit (HOSPITAL_BASED_OUTPATIENT_CLINIC_OR_DEPARTMENT_OTHER): Payer: BC Managed Care – PPO | Admitting: Physician Assistant

## 2012-04-18 ENCOUNTER — Other Ambulatory Visit (HOSPITAL_BASED_OUTPATIENT_CLINIC_OR_DEPARTMENT_OTHER): Payer: BC Managed Care – PPO | Admitting: Lab

## 2012-04-18 ENCOUNTER — Other Ambulatory Visit: Payer: BC Managed Care – PPO | Admitting: Lab

## 2012-04-18 VITALS — BP 138/83 | HR 81 | Temp 97.9°F | Resp 20 | Ht 67.0 in | Wt 126.4 lb

## 2012-04-18 DIAGNOSIS — C50119 Malignant neoplasm of central portion of unspecified female breast: Secondary | ICD-10-CM

## 2012-04-18 DIAGNOSIS — Z853 Personal history of malignant neoplasm of breast: Secondary | ICD-10-CM

## 2012-04-18 DIAGNOSIS — C44599 Other specified malignant neoplasm of skin of other part of trunk: Secondary | ICD-10-CM

## 2012-04-18 DIAGNOSIS — C50911 Malignant neoplasm of unspecified site of right female breast: Secondary | ICD-10-CM

## 2012-04-18 DIAGNOSIS — C44509 Unspecified malignant neoplasm of skin of other part of trunk: Secondary | ICD-10-CM

## 2012-04-18 DIAGNOSIS — Z170 Estrogen receptor positive status [ER+]: Secondary | ICD-10-CM

## 2012-04-18 DIAGNOSIS — C801 Malignant (primary) neoplasm, unspecified: Secondary | ICD-10-CM

## 2012-04-18 DIAGNOSIS — Z5111 Encounter for antineoplastic chemotherapy: Secondary | ICD-10-CM

## 2012-04-18 LAB — COMPREHENSIVE METABOLIC PANEL (CC13)
AST: 16 U/L (ref 5–34)
Alkaline Phosphatase: 70 U/L (ref 40–150)
BUN: 17 mg/dL (ref 7.0–26.0)
Glucose: 102 mg/dl — ABNORMAL HIGH (ref 70–99)
Potassium: 3.8 mEq/L (ref 3.5–5.1)
Sodium: 143 mEq/L (ref 136–145)
Total Bilirubin: 0.59 mg/dL (ref 0.20–1.20)

## 2012-04-18 LAB — CBC WITH DIFFERENTIAL/PLATELET
Basophils Absolute: 0 10*3/uL (ref 0.0–0.1)
EOS%: 1 % (ref 0.0–7.0)
HCT: 39.3 % (ref 34.8–46.6)
HGB: 13 g/dL (ref 11.6–15.9)
MCH: 30.8 pg (ref 25.1–34.0)
MONO#: 0.2 10*3/uL (ref 0.1–0.9)
NEUT#: 3.7 10*3/uL (ref 1.5–6.5)
NEUT%: 73.8 % (ref 38.4–76.8)
RDW: 13.8 % (ref 11.2–14.5)
WBC: 5 10*3/uL (ref 3.9–10.3)
lymph#: 1 10*3/uL (ref 0.9–3.3)

## 2012-04-18 MED ORDER — HEPARIN SOD (PORK) LOCK FLUSH 100 UNIT/ML IV SOLN
500.0000 [IU] | Freq: Once | INTRAVENOUS | Status: AC | PRN
  Administered 2012-04-18: 500 [IU]
  Filled 2012-04-18: qty 5

## 2012-04-18 MED ORDER — CIPROFLOXACIN HCL 500 MG PO TABS: 500.0000 mg | ORAL_TABLET | Freq: Two times a day (BID) | ORAL | Status: DC

## 2012-04-18 MED ORDER — PACLITAXEL PROTEIN-BOUND CHEMO INJECTION 100 MG
100.0000 mg/m2 | Freq: Once | INTRAVENOUS | Status: AC
  Administered 2012-04-18: 175 mg via INTRAVENOUS
  Filled 2012-04-18: qty 35

## 2012-04-18 MED ORDER — ONDANSETRON 8 MG/50ML IVPB (CHCC)
8.0000 mg | Freq: Once | INTRAVENOUS | Status: AC
  Administered 2012-04-18: 8 mg via INTRAVENOUS

## 2012-04-18 MED ORDER — SODIUM CHLORIDE 0.9 % IJ SOLN
10.0000 mL | INTRAMUSCULAR | Status: DC | PRN
  Administered 2012-04-18: 10 mL
  Filled 2012-04-18: qty 10

## 2012-04-18 MED ORDER — DEXAMETHASONE SODIUM PHOSPHATE 10 MG/ML IJ SOLN
10.0000 mg | Freq: Once | INTRAMUSCULAR | Status: AC
  Administered 2012-04-18: 10 mg via INTRAVENOUS

## 2012-04-18 MED ORDER — SODIUM CHLORIDE 0.9 % IV SOLN
Freq: Once | INTRAVENOUS | Status: AC
  Administered 2012-04-18: 14:00:00 via INTRAVENOUS
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
## 2012-04-18 NOTE — Telephone Encounter (Signed)
lab and infusion 1/15 and 2/12; lab, AB and infusion 1/22 and 2/5; lab, GM and infusion   Sent michelle email to set up patient's treatment

## 2012-04-18 NOTE — Telephone Encounter (Signed)
Per staff message and POF I have scheduled appts.  [name]  

## 2012-04-18 NOTE — Patient Instructions (Addendum)
[facility] Discharge Instructions for Patients Receiving Chemotherapy  Today you received the following chemotherapy agents :  Abraxane.  To help prevent nausea and vomiting after your treatment, we encourage you to take your nausea medication as instructed by your physician.    If you develop nausea and vomiting that is not controlled by your nausea medication, call the clinic. If it is after clinic hours your family physician or the after hours number for the clinic or go to the Emergency Department.   BELOW ARE SYMPTOMS THAT SHOULD BE REPORTED IMMEDIATELY:  *FEVER GREATER THAN 100.5 F  *CHILLS WITH OR WITHOUT FEVER  NAUSEA AND VOMITING THAT IS NOT CONTROLLED WITH YOUR NAUSEA MEDICATION  *UNUSUAL SHORTNESS OF BREATH  *UNUSUAL BRUISING OR BLEEDING  TENDERNESS IN MOUTH AND THROAT WITH OR WITHOUT PRESENCE OF ULCERS  *URINARY PROBLEMS  *BOWEL PROBLEMS  UNUSUAL RASH Items with * indicate a potential emergency and should be followed up as soon as possible.  One of the nurses will contact you 24 hours after your treatment. Please let the nurse know about any problems that you may have experienced. Feel free to call the clinic you have any questions or concerns. The clinic phone number is [phone].   I have been informed and understand all the instructions given to me. I know to contact the clinic, my physician, or go to the Emergency Department if any problems should occur. I do not have any questions at this time, but understand that I may call the clinic during office hours   should I have any questions or need assistance in obtaining follow up care.    __________________________________________  _____________  __________ Signature of Patient or Authorized Representative            Date                   Time    __________________________________________ Nurse's Signature

## 2012-04-18 NOTE — Telephone Encounter (Signed)
Made patient appointment for echo and bensihom

## 2012-04-19 NOTE — Progress Notes (Addendum)
ENROLLED PATIENT WITH [facility] FOR [name] AND [facility] FOR ABRAXANE.  06/08/12 FAXED INCOME INFO TO DEBBY KARL.  06/12/12 Christina Daniel HAS BEEN APPROVED FOR 10,000.00 IN [employer].

## 2012-04-23 ENCOUNTER — Other Ambulatory Visit: Payer: Self-pay | Admitting: *Deleted

## 2012-04-23 MED ORDER — METHYLPREDNISOLONE 4 MG PO KIT: PACK | ORAL | Status: DC

## 2012-04-23 MED ORDER — DOXYCYCLINE HYCLATE 100 MG PO TABS: 100.0000 mg | ORAL_TABLET | Freq: Every day | ORAL | Status: DC

## 2012-04-23 NOTE — Telephone Encounter (Signed)
Christina Daniel called to state onset of body wide rash over the weekend. She is using benadryl with benefit but is calling for further follow up and " what caused this ?"  This RN returned call to pt, noted pt was started on cipro at last office visit.  Per discussion Tilda stopped the cipro when rash appeared but did not call this office.  Informed pt rash is more than likely related to the cipro- note pt used cipro approximately a month ago for a UTI with no noted interaction. This RN informed pt intolerance can occur with subsequent use.  Plan per review is for medrol dose pak and for pt to take doxicycline for post chemo coverage and due to ingrown toenail.

## 2012-04-26 ENCOUNTER — Other Ambulatory Visit (HOSPITAL_BASED_OUTPATIENT_CLINIC_OR_DEPARTMENT_OTHER): Payer: BC Managed Care – PPO | Admitting: Lab

## 2012-04-26 ENCOUNTER — Ambulatory Visit (HOSPITAL_BASED_OUTPATIENT_CLINIC_OR_DEPARTMENT_OTHER): Payer: BC Managed Care – PPO

## 2012-04-26 VITALS — BP 148/94 | HR 69 | Temp 99.1°F | Resp 20

## 2012-04-26 DIAGNOSIS — C801 Malignant (primary) neoplasm, unspecified: Secondary | ICD-10-CM

## 2012-04-26 DIAGNOSIS — Z5111 Encounter for antineoplastic chemotherapy: Secondary | ICD-10-CM

## 2012-04-26 DIAGNOSIS — C50911 Malignant neoplasm of unspecified site of right female breast: Secondary | ICD-10-CM

## 2012-04-26 DIAGNOSIS — C50119 Malignant neoplasm of central portion of unspecified female breast: Secondary | ICD-10-CM

## 2012-04-26 DIAGNOSIS — Z5112 Encounter for antineoplastic immunotherapy: Secondary | ICD-10-CM

## 2012-04-26 LAB — URINALYSIS, MICROSCOPIC - CHCC
Bilirubin (Urine): NEGATIVE
Blood: NEGATIVE
Ketones: NEGATIVE mg/dL
RBC / HPF: NEGATIVE (ref 0–2)
Specific Gravity, Urine: 1.01 (ref 1.003–1.035)
pH: 6.5 (ref 4.6–8.0)

## 2012-04-26 LAB — COMPREHENSIVE METABOLIC PANEL (CC13)
AST: 22 U/L (ref 5–34)
Albumin: 3.8 g/dL (ref 3.5–5.0)
BUN: 22 mg/dL (ref 7.0–26.0)
CO2: 25 mEq/L (ref 22–29)
Calcium: 9.5 mg/dL (ref 8.4–10.4)
Chloride: 104 mEq/L (ref 98–107)
Glucose: 109 mg/dl — ABNORMAL HIGH (ref 70–99)
Potassium: 3.7 mEq/L (ref 3.5–5.1)

## 2012-04-26 LAB — CBC WITH DIFFERENTIAL/PLATELET
Basophils Absolute: 0 10*3/uL (ref 0.0–0.1)
Eosinophils Absolute: 0 10*3/uL (ref 0.0–0.5)
HCT: 40.4 % (ref 34.8–46.6)
LYMPH%: 17.7 % (ref 14.0–49.7)
MCV: 91.8 fL (ref 79.5–101.0)
MONO#: 0.4 10*3/uL (ref 0.1–0.9)
NEUT#: 4.5 10*3/uL (ref 1.5–6.5)
NEUT%: 75 % (ref 38.4–76.8)
Platelets: 330 10*3/uL (ref 145–400)
WBC: 6 10*3/uL (ref 3.9–10.3)

## 2012-04-26 MED ORDER — SODIUM CHLORIDE 0.9 % IV SOLN
Freq: Once | INTRAVENOUS | Status: AC
  Administered 2012-04-26: 14:00:00 via INTRAVENOUS

## 2012-04-26 MED ORDER — TRASTUZUMAB CHEMO INJECTION 440 MG
4.0000 mg/kg | Freq: Once | INTRAVENOUS | Status: AC
  Administered 2012-04-26: 231 mg via INTRAVENOUS
  Filled 2012-04-26: qty 11

## 2012-04-26 MED ORDER — ACETAMINOPHEN 325 MG PO TABS
650.0000 mg | ORAL_TABLET | Freq: Once | ORAL | Status: AC
  Administered 2012-04-26: 650 mg via ORAL

## 2012-04-26 MED ORDER — ONDANSETRON 8 MG/50ML IVPB (CHCC)
8.0000 mg | Freq: Once | INTRAVENOUS | Status: AC
Start: 2012-04-26 — End: 2012-04-26
  Administered 2012-04-26: 8 mg via INTRAVENOUS

## 2012-04-26 MED ORDER — SODIUM CHLORIDE 0.9 % IJ SOLN
10.0000 mL | INTRAMUSCULAR | Status: DC | PRN
  Administered 2012-04-26: 10 mL
  Filled 2012-04-26: qty 10

## 2012-04-26 MED ORDER — HEPARIN SOD (PORK) LOCK FLUSH 100 UNIT/ML IV SOLN
500.0000 [IU] | Freq: Once | INTRAVENOUS | Status: AC | PRN
  Administered 2012-04-26: 500 [IU]
  Filled 2012-04-26: qty 5

## 2012-04-26 MED ORDER — DEXAMETHASONE SODIUM PHOSPHATE 10 MG/ML IJ SOLN
10.0000 mg | Freq: Once | INTRAMUSCULAR | Status: AC
  Administered 2012-04-26: 10 mg via INTRAVENOUS

## 2012-04-26 MED ORDER — PACLITAXEL PROTEIN-BOUND CHEMO INJECTION 100 MG
100.0000 mg/m2 | Freq: Once | INTRAVENOUS | Status: AC
  Administered 2012-04-26: 175 mg via INTRAVENOUS
  Filled 2012-04-26: qty 35

## 2012-04-26 MED ORDER — DIPHENHYDRAMINE HCL 25 MG PO CAPS
25.0000 mg | ORAL_CAPSULE | Freq: Once | ORAL | Status: AC
  Administered 2012-04-26: 25 mg via ORAL

## 2012-04-26 NOTE — Patient Instructions (Addendum)
[facility] Discharge Instructions for Patients Receiving Chemotherapy  Today you received the following chemotherapy agents Abraxane, [name]  To help prevent nausea and vomiting after your treatment, we encourage you to take your nausea medication.   If you develop nausea and vomiting that is not controlled by your nausea medication, call the clinic. If it is after clinic hours your family physician or the after hours number for the clinic or go to the Emergency Department.   BELOW ARE SYMPTOMS THAT SHOULD BE REPORTED IMMEDIATELY:  *FEVER GREATER THAN 100.5 F  *CHILLS WITH OR WITHOUT FEVER  NAUSEA AND VOMITING THAT IS NOT CONTROLLED WITH YOUR NAUSEA MEDICATION  *UNUSUAL SHORTNESS OF BREATH  *UNUSUAL BRUISING OR BLEEDING  TENDERNESS IN MOUTH AND THROAT WITH OR WITHOUT PRESENCE OF ULCERS  *URINARY PROBLEMS  *BOWEL PROBLEMS  UNUSUAL RASH Items with * indicate a potential emergency and should be followed up as soon as possible.  One of the nurses will contact you 24 hours after your treatment. Please let the nurse know about any problems that you may have experienced. Feel free to call the clinic you have any questions or concerns. The clinic phone number is [phone].   I have been informed and understand all the instructions given to me. I know to contact the clinic, my physician, or go to the Emergency Department if any problems should occur. I do not have any questions at this time, but understand that I may call the clinic during office hours   should I have any questions or need assistance in obtaining follow up care.    __________________________________________  _____________  __________ Signature of Patient or Authorized Representative            Date                   Time    __________________________________________ Nurse's Signature

## 2012-04-29 ENCOUNTER — Encounter: Payer: Self-pay | Admitting: Physician Assistant

## 2012-05-01 ENCOUNTER — Other Ambulatory Visit: Payer: BC Managed Care – PPO | Admitting: Lab

## 2012-05-01 ENCOUNTER — Ambulatory Visit: Payer: BC Managed Care – PPO | Admitting: Physician Assistant

## 2012-05-03 ENCOUNTER — Other Ambulatory Visit: Payer: BC Managed Care – PPO | Admitting: Lab

## 2012-05-03 ENCOUNTER — Ambulatory Visit: Payer: BC Managed Care – PPO | Admitting: Oncology

## 2012-05-03 ENCOUNTER — Ambulatory Visit: Payer: BC Managed Care – PPO

## 2012-05-04 ENCOUNTER — Ambulatory Visit: Payer: BC Managed Care – PPO

## 2012-05-09 ENCOUNTER — Ambulatory Visit (HOSPITAL_BASED_OUTPATIENT_CLINIC_OR_DEPARTMENT_OTHER): Payer: BC Managed Care – PPO | Admitting: Oncology

## 2012-05-09 ENCOUNTER — Telehealth: Payer: Self-pay | Admitting: Oncology

## 2012-05-09 ENCOUNTER — Other Ambulatory Visit (HOSPITAL_BASED_OUTPATIENT_CLINIC_OR_DEPARTMENT_OTHER): Payer: BC Managed Care – PPO | Admitting: Lab

## 2012-05-09 ENCOUNTER — Ambulatory Visit (HOSPITAL_BASED_OUTPATIENT_CLINIC_OR_DEPARTMENT_OTHER): Payer: BC Managed Care – PPO

## 2012-05-09 VITALS — BP 146/89 | HR 92 | Temp 97.8°F | Resp 20 | Ht 67.0 in | Wt 127.4 lb

## 2012-05-09 DIAGNOSIS — C50911 Malignant neoplasm of unspecified site of right female breast: Secondary | ICD-10-CM

## 2012-05-09 DIAGNOSIS — Z5112 Encounter for antineoplastic immunotherapy: Secondary | ICD-10-CM

## 2012-05-09 DIAGNOSIS — C44599 Other specified malignant neoplasm of skin of other part of trunk: Secondary | ICD-10-CM

## 2012-05-09 DIAGNOSIS — C801 Malignant (primary) neoplasm, unspecified: Secondary | ICD-10-CM

## 2012-05-09 DIAGNOSIS — C44509 Unspecified malignant neoplasm of skin of other part of trunk: Secondary | ICD-10-CM

## 2012-05-09 DIAGNOSIS — C50119 Malignant neoplasm of central portion of unspecified female breast: Secondary | ICD-10-CM

## 2012-05-09 DIAGNOSIS — R21 Rash and other nonspecific skin eruption: Secondary | ICD-10-CM

## 2012-05-09 DIAGNOSIS — Z170 Estrogen receptor positive status [ER+]: Secondary | ICD-10-CM

## 2012-05-09 DIAGNOSIS — Z5111 Encounter for antineoplastic chemotherapy: Secondary | ICD-10-CM

## 2012-05-09 LAB — COMPREHENSIVE METABOLIC PANEL (CC13)
Albumin: 3.7 g/dL (ref 3.5–5.0)
Alkaline Phosphatase: 70 U/L (ref 40–150)
BUN: 15 mg/dL (ref 7.0–26.0)
CO2: 26 mEq/L (ref 22–29)
Calcium: 9 mg/dL (ref 8.4–10.4)
Chloride: 104 mEq/L (ref 98–107)
Glucose: 100 mg/dl — ABNORMAL HIGH (ref 70–99)
Potassium: 3.9 mEq/L (ref 3.5–5.1)
Sodium: 143 mEq/L (ref 136–145)
Total Protein: 7 g/dL (ref 6.4–8.3)

## 2012-05-09 LAB — CBC WITH DIFFERENTIAL/PLATELET
BASO%: 0.4 % (ref 0.0–2.0)
EOS%: 0.2 % (ref 0.0–7.0)
LYMPH%: 19 % (ref 14.0–49.7)
MCHC: 33.6 g/dL (ref 31.5–36.0)
MCV: 91.2 fL (ref 79.5–101.0)
MONO%: 9.1 % (ref 0.0–14.0)
NEUT#: 3.4 10*3/uL (ref 1.5–6.5)
Platelets: 275 10*3/uL (ref 145–400)
RBC: 4.21 10*6/uL (ref 3.70–5.45)
RDW: 14.5 % (ref 11.2–14.5)
WBC: 4.7 10*3/uL (ref 3.9–10.3)
nRBC: 0 % (ref 0–0)

## 2012-05-09 MED ORDER — DEXAMETHASONE 4 MG PO TABS: 8.0000 mg | ORAL_TABLET | Freq: Two times a day (BID) | ORAL | Status: DC

## 2012-05-09 MED ORDER — ONDANSETRON HCL 8 MG PO TABS: 8.0000 mg | ORAL_TABLET | Freq: Two times a day (BID) | ORAL | Status: DC | PRN

## 2012-05-09 MED ORDER — PACLITAXEL PROTEIN-BOUND CHEMO INJECTION 100 MG
100.0000 mg/m2 | Freq: Once | INTRAVENOUS | Status: AC
  Administered 2012-05-09: 175 mg via INTRAVENOUS
  Filled 2012-05-09: qty 35

## 2012-05-09 MED ORDER — ONDANSETRON 8 MG/50ML IVPB (CHCC)
8.0000 mg | Freq: Once | INTRAVENOUS | Status: AC
  Administered 2012-05-09: 8 mg via INTRAVENOUS

## 2012-05-09 MED ORDER — SODIUM CHLORIDE 0.9 % IJ SOLN
10.0000 mL | INTRAMUSCULAR | Status: DC | PRN
  Administered 2012-05-09: 10 mL
  Filled 2012-05-09: qty 10

## 2012-05-09 MED ORDER — DEXAMETHASONE SODIUM PHOSPHATE 10 MG/ML IJ SOLN
10.0000 mg | Freq: Once | INTRAMUSCULAR | Status: AC
  Administered 2012-05-09: 10 mg via INTRAVENOUS

## 2012-05-09 MED ORDER — HEPARIN SOD (PORK) LOCK FLUSH 100 UNIT/ML IV SOLN
500.0000 [IU] | Freq: Once | INTRAVENOUS | Status: AC | PRN
  Administered 2012-05-09: 500 [IU]
  Filled 2012-05-09: qty 5

## 2012-05-09 MED ORDER — TRASTUZUMAB CHEMO INJECTION 440 MG
6.0000 mg/kg | Freq: Once | INTRAVENOUS | Status: AC
  Administered 2012-05-09: 336 mg via INTRAVENOUS
  Filled 2012-05-09: qty 16

## 2012-05-09 MED ORDER — ACETAMINOPHEN 325 MG PO TABS
650.0000 mg | ORAL_TABLET | Freq: Once | ORAL | Status: AC
  Administered 2012-05-09: 650 mg via ORAL

## 2012-05-09 MED ORDER — DIPHENHYDRAMINE HCL 25 MG PO CAPS
25.0000 mg | ORAL_CAPSULE | Freq: Once | ORAL | Status: AC
  Administered 2012-05-09: 25 mg via ORAL

## 2012-05-09 MED ORDER — SODIUM CHLORIDE 0.9 % IV SOLN
Freq: Once | INTRAVENOUS | Status: AC
  Administered 2012-05-09: 13:00:00 via INTRAVENOUS

## 2012-05-09 MED ORDER — SODIUM CHLORIDE 0.9 % IV SOLN: Freq: Once | INTRAVENOUS | Status: DC

## 2012-05-09 MED ORDER — SODIUM CHLORIDE 0.9 % IV SOLN
563.0000 mg | Freq: Once | INTRAVENOUS | Status: AC
  Administered 2012-05-09: 560 mg via INTRAVENOUS
  Filled 2012-05-09: qty 56

## 2012-05-09 NOTE — Patient Instructions (Addendum)
[facility] Discharge Instructions for Patients Receiving Chemotherapy  Today you received the following chemotherapy agents :  Carboplatin, [name], Abraxane.  To help prevent nausea and vomiting after your treatment, we encourage you to take your nausea medication as instructed by your physician.    If you develop nausea and vomiting that is not controlled by your nausea medication, call the clinic. If it is after clinic hours your family physician or the after hours number for the clinic or go to the Emergency Department.   BELOW ARE SYMPTOMS THAT SHOULD BE REPORTED IMMEDIATELY:  *FEVER GREATER THAN 100.5 F  *CHILLS WITH OR WITHOUT FEVER  NAUSEA AND VOMITING THAT IS NOT CONTROLLED WITH YOUR NAUSEA MEDICATION  *UNUSUAL SHORTNESS OF BREATH  *UNUSUAL BRUISING OR BLEEDING  TENDERNESS IN MOUTH AND THROAT WITH OR WITHOUT PRESENCE OF ULCERS  *URINARY PROBLEMS  *BOWEL PROBLEMS  UNUSUAL RASH Items with * indicate a potential emergency and should be followed up as soon as possible.  One of the nurses will contact you 24 hours after your treatment. Please let the nurse know about any problems that you may have experienced. Feel free to call the clinic you have any questions or concerns. The clinic phone number is [phone].   I have been informed and understand all the instructions given to me. I know to contact the clinic, my physician, or go to the Emergency Department if any problems should occur. I do not have any questions at this time, but understand that I may call the clinic during office hours   should I have any questions or need assistance in obtaining follow up care.    __________________________________________  _____________  __________ Signature of Patient or Authorized Representative            Date                   Time    __________________________________________ Nurse's Signature
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
## 2012-05-09 NOTE — Telephone Encounter (Signed)
gv pt appt schedule for January thru March. Per 1/8 pof see AB 1/15 @ 10:15am - 10:15am is AB's break and she already has a patient booked in that slot. Per AB pt was scheduled for 1/15 w/GM @ 1pm which was an open slot. Pt has additional questions re tx and wrote those questions down for me to give to GM/Val. Note left for Val - pt aware.

## 2012-05-10 ENCOUNTER — Telehealth: Payer: Self-pay | Admitting: *Deleted

## 2012-05-10 NOTE — Telephone Encounter (Signed)
Called patient for follow up following first Carboplatin, denies any problems.  Wants to talk to Dr Darnelle Catalan next week before chemo, she just wonders if adding this was TOO much, and thinks he went with it because she asked about it.  Informed patient she needs to discuss this with Dr Darnelle Catalan on her appt on 05/17/11.

## 2012-05-10 NOTE — Telephone Encounter (Signed)
Message copied by Kathlynn Grate on Thu May 10, 2012  3:50 PM ------      Message from: Normajean Baxter LE      Created: Wed May 09, 2012  4:59 PM      Regarding: Chemo f/u call      Contact: [phone]       First time Carboplatin, Dr. Thea Silversmith, TB, RN.

## 2012-05-16 ENCOUNTER — Other Ambulatory Visit (HOSPITAL_BASED_OUTPATIENT_CLINIC_OR_DEPARTMENT_OTHER): Payer: BC Managed Care – PPO | Admitting: Lab

## 2012-05-16 ENCOUNTER — Other Ambulatory Visit: Payer: BC Managed Care – PPO | Admitting: Lab

## 2012-05-16 ENCOUNTER — Ambulatory Visit (HOSPITAL_BASED_OUTPATIENT_CLINIC_OR_DEPARTMENT_OTHER): Payer: BC Managed Care – PPO

## 2012-05-16 ENCOUNTER — Ambulatory Visit (HOSPITAL_BASED_OUTPATIENT_CLINIC_OR_DEPARTMENT_OTHER): Payer: BC Managed Care – PPO | Admitting: Oncology

## 2012-05-16 VITALS — BP 131/87 | HR 85 | Temp 97.6°F | Resp 20 | Ht 67.0 in | Wt 126.4 lb

## 2012-05-16 DIAGNOSIS — C50911 Malignant neoplasm of unspecified site of right female breast: Secondary | ICD-10-CM

## 2012-05-16 DIAGNOSIS — C801 Malignant (primary) neoplasm, unspecified: Secondary | ICD-10-CM

## 2012-05-16 DIAGNOSIS — Z87898 Personal history of other specified conditions: Secondary | ICD-10-CM

## 2012-05-16 DIAGNOSIS — Z5111 Encounter for antineoplastic chemotherapy: Secondary | ICD-10-CM

## 2012-05-16 DIAGNOSIS — C50119 Malignant neoplasm of central portion of unspecified female breast: Secondary | ICD-10-CM

## 2012-05-16 DIAGNOSIS — Z170 Estrogen receptor positive status [ER+]: Secondary | ICD-10-CM

## 2012-05-16 DIAGNOSIS — C50519 Malignant neoplasm of lower-outer quadrant of unspecified female breast: Secondary | ICD-10-CM

## 2012-05-16 LAB — COMPREHENSIVE METABOLIC PANEL (CC13)
CO2: 24 mEq/L (ref 22–29)
Creatinine: 0.6 mg/dL (ref 0.6–1.1)
Glucose: 92 mg/dl (ref 70–99)
Total Bilirubin: 0.58 mg/dL (ref 0.20–1.20)

## 2012-05-16 LAB — CBC WITH DIFFERENTIAL/PLATELET
BASO%: 0.2 % (ref 0.0–2.0)
Basophils Absolute: 0 10*3/uL (ref 0.0–0.1)
HCT: 40.7 % (ref 34.8–46.6)
HGB: 13.9 g/dL (ref 11.6–15.9)
LYMPH%: 15.5 % (ref 14.0–49.7)
MCH: 31.1 pg (ref 25.1–34.0)
MCHC: 34.2 g/dL (ref 31.5–36.0)
MONO#: 0.3 10*3/uL (ref 0.1–0.9)
NEUT%: 80.6 % — ABNORMAL HIGH (ref 38.4–76.8)
Platelets: 319 10*3/uL (ref 145–400)
WBC: 8.6 10*3/uL (ref 3.9–10.3)
lymph#: 1.3 10*3/uL (ref 0.9–3.3)

## 2012-05-16 MED ORDER — ONDANSETRON 8 MG/50ML IVPB (CHCC)
8.0000 mg | Freq: Once | INTRAVENOUS | Status: AC
  Administered 2012-05-16: 8 mg via INTRAVENOUS

## 2012-05-16 MED ORDER — SODIUM CHLORIDE 0.9 % IJ SOLN
10.0000 mL | INTRAMUSCULAR | Status: DC | PRN
  Administered 2012-05-16: 10 mL
  Filled 2012-05-16: qty 10

## 2012-05-16 MED ORDER — SODIUM CHLORIDE 0.9 % IV SOLN
Freq: Once | INTRAVENOUS | Status: AC
  Administered 2012-05-16: 14:00:00 via INTRAVENOUS

## 2012-05-16 MED ORDER — DEXAMETHASONE SODIUM PHOSPHATE 10 MG/ML IJ SOLN
10.0000 mg | Freq: Once | INTRAMUSCULAR | Status: AC
  Administered 2012-05-16: 10 mg via INTRAVENOUS

## 2012-05-16 MED ORDER — PACLITAXEL PROTEIN-BOUND CHEMO INJECTION 100 MG
100.0000 mg/m2 | Freq: Once | INTRAVENOUS | Status: AC
  Administered 2012-05-16: 175 mg via INTRAVENOUS
  Filled 2012-05-16: qty 35

## 2012-05-16 MED ORDER — HEPARIN SOD (PORK) LOCK FLUSH 100 UNIT/ML IV SOLN
500.0000 [IU] | Freq: Once | INTRAVENOUS | Status: AC | PRN
  Administered 2012-05-16: 500 [IU]
  Filled 2012-05-16: qty 5
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
## 2012-05-16 NOTE — Patient Instructions (Addendum)
[facility] Discharge Instructions for Patients Receiving Chemotherapy  Today you received the following chemotherapy agents :  abraxane  To help prevent nausea and vomiting after your treatment, we encourage you to take your nausea medication   If you develop nausea and vomiting that is not controlled by your nausea medication, call the clinic. If it is after clinic hours your family physician or the after hours number for the clinic or go to the Emergency Department.   BELOW ARE SYMPTOMS THAT SHOULD BE REPORTED IMMEDIATELY:  *FEVER GREATER THAN 100.5 F  *CHILLS WITH OR WITHOUT FEVER  NAUSEA AND VOMITING THAT IS NOT CONTROLLED WITH YOUR NAUSEA MEDICATION  *UNUSUAL SHORTNESS OF BREATH  *UNUSUAL BRUISING OR BLEEDING  TENDERNESS IN MOUTH AND THROAT WITH OR WITHOUT PRESENCE OF ULCERS  *URINARY PROBLEMS  *BOWEL PROBLEMS  UNUSUAL RASH Items with * indicate a potential emergency and should be followed up as soon as possible.   Please let the nurse know about any problems that you may have experienced. Feel free to call the clinic you have any questions or concerns. The clinic phone number is [phone].   I have been informed and understand all the instructions given to me. I know to contact the clinic, my physician, or go to the Emergency Department if any problems should occur. I do not have any questions at this time, but understand that I may call the clinic during office hours   should I have any questions or need assistance in obtaining follow up care.    __________________________________________  _____________  __________ Signature of Patient or Authorized Representative            Date                   Time    __________________________________________ Nurse's Signature

## 2012-05-18 ENCOUNTER — Telehealth: Payer: Self-pay | Admitting: Oncology

## 2012-05-18 NOTE — Telephone Encounter (Signed)
Added appts for 3/5, 3/12 and 3/19. S/w pt and she will get  New schedule 1/22.

## 2012-05-22 ENCOUNTER — Other Ambulatory Visit: Payer: Self-pay | Admitting: *Deleted

## 2012-05-22 NOTE — Progress Notes (Signed)
Pt called to this RN inquiring if chemotherapy can cause BP changes. She states she normally has reading around 110/70 with pulse of 70. Noted since onset of new breast cancer diagnosis continued elevation in both readings. She states concern due to known thoracic aneurysm followed by Dr Garnette Scheuermann, " he has always told me to monitor my BP and to keep it low ".  This RN reviewed vitals documented by this office with pt. Informed pt noted elevations documented but need for pt to check BP at home in am at same time in same scernerio. Veroncia verbalized understanding.  Emorie also stated scalp irritation and noted areas on skin " in the folds of my body " of rashes. Pt is treating both of the above with OTC meds with some benefit but did inquire about rash and " is there something stronger or better I should be using ?"  She is scheduled for follow up tomorrow with AB/PA and her concerns will be further discussed then.  AB/PA made aware of the above.

## 2012-05-23 ENCOUNTER — Other Ambulatory Visit (HOSPITAL_BASED_OUTPATIENT_CLINIC_OR_DEPARTMENT_OTHER): Payer: BC Managed Care – PPO | Admitting: Lab

## 2012-05-23 ENCOUNTER — Encounter: Payer: Self-pay | Admitting: Physician Assistant

## 2012-05-23 ENCOUNTER — Ambulatory Visit: Payer: BC Managed Care – PPO | Admitting: Physician Assistant

## 2012-05-23 ENCOUNTER — Other Ambulatory Visit: Payer: BC Managed Care – PPO | Admitting: Lab

## 2012-05-23 ENCOUNTER — Ambulatory Visit (HOSPITAL_BASED_OUTPATIENT_CLINIC_OR_DEPARTMENT_OTHER): Payer: BC Managed Care – PPO

## 2012-05-23 ENCOUNTER — Ambulatory Visit: Payer: BC Managed Care – PPO

## 2012-05-23 ENCOUNTER — Ambulatory Visit (HOSPITAL_BASED_OUTPATIENT_CLINIC_OR_DEPARTMENT_OTHER): Payer: BC Managed Care – PPO | Admitting: Physician Assistant

## 2012-05-23 VITALS — BP 125/84 | HR 96 | Temp 98.1°F | Resp 20 | Ht 67.0 in | Wt 128.4 lb

## 2012-05-23 DIAGNOSIS — C50119 Malignant neoplasm of central portion of unspecified female breast: Secondary | ICD-10-CM

## 2012-05-23 DIAGNOSIS — C801 Malignant (primary) neoplasm, unspecified: Secondary | ICD-10-CM

## 2012-05-23 DIAGNOSIS — C50911 Malignant neoplasm of unspecified site of right female breast: Secondary | ICD-10-CM

## 2012-05-23 DIAGNOSIS — Z170 Estrogen receptor positive status [ER+]: Secondary | ICD-10-CM

## 2012-05-23 DIAGNOSIS — Z5111 Encounter for antineoplastic chemotherapy: Secondary | ICD-10-CM

## 2012-05-23 LAB — CBC WITH DIFFERENTIAL/PLATELET
Eosinophils Absolute: 0 10*3/uL (ref 0.0–0.5)
HCT: 36.6 % (ref 34.8–46.6)
LYMPH%: 25.9 % (ref 14.0–49.7)
MONO#: 0.2 10*3/uL (ref 0.1–0.9)
NEUT#: 2.2 10*3/uL (ref 1.5–6.5)
NEUT%: 66.7 % (ref 38.4–76.8)
Platelets: 274 10*3/uL (ref 145–400)
RBC: 4 10*6/uL (ref 3.70–5.45)
WBC: 3.4 10*3/uL — ABNORMAL LOW (ref 3.9–10.3)
nRBC: 0 % (ref 0–0)

## 2012-05-23 MED ORDER — FLUCONAZOLE 100 MG PO TABS: ORAL_TABLET | ORAL | Status: DC

## 2012-05-23 MED ORDER — SODIUM CHLORIDE 0.9 % IV SOLN
Freq: Once | INTRAVENOUS | Status: AC
  Administered 2012-05-23: 12:00:00 via INTRAVENOUS

## 2012-05-23 MED ORDER — PACLITAXEL PROTEIN-BOUND CHEMO INJECTION 100 MG
100.0000 mg/m2 | Freq: Once | INTRAVENOUS | Status: AC
  Administered 2012-05-23: 175 mg via INTRAVENOUS
  Filled 2012-05-23: qty 35

## 2012-05-23 MED ORDER — ONDANSETRON 8 MG/50ML IVPB (CHCC)
8.0000 mg | Freq: Once | INTRAVENOUS | Status: AC
  Administered 2012-05-23: 8 mg via INTRAVENOUS

## 2012-05-23 MED ORDER — HEPARIN SOD (PORK) LOCK FLUSH 100 UNIT/ML IV SOLN
500.0000 [IU] | Freq: Once | INTRAVENOUS | Status: AC | PRN
  Administered 2012-05-23: 500 [IU]
  Filled 2012-05-23: qty 5

## 2012-05-23 MED ORDER — SODIUM CHLORIDE 0.9 % IJ SOLN
10.0000 mL | INTRAMUSCULAR | Status: DC | PRN
  Administered 2012-05-23: 10 mL
  Filled 2012-05-23: qty 10

## 2012-05-23 MED ORDER — DEXAMETHASONE SODIUM PHOSPHATE 10 MG/ML IJ SOLN
10.0000 mg | Freq: Once | INTRAMUSCULAR | Status: AC
  Administered 2012-05-23: 10 mg via INTRAVENOUS
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
## 2012-05-23 NOTE — Patient Instructions (Addendum)
[facility] Discharge Instructions for Patients Receiving Chemotherapy  Today you received the following chemotherapy agents Abraxane  To help prevent nausea and vomiting after your treatment, we encourage you to take your nausea medication as prescribed.   If you develop nausea and vomiting that is not controlled by your nausea medication, call the clinic. If it is after clinic hours your family physician or the after hours number for the clinic or go to the Emergency Department.   BELOW ARE SYMPTOMS THAT SHOULD BE REPORTED IMMEDIATELY:  *FEVER GREATER THAN 100.5 F  *CHILLS WITH OR WITHOUT FEVER  NAUSEA AND VOMITING THAT IS NOT CONTROLLED WITH YOUR NAUSEA MEDICATION  *UNUSUAL SHORTNESS OF BREATH  *UNUSUAL BRUISING OR BLEEDING  TENDERNESS IN MOUTH AND THROAT WITH OR WITHOUT PRESENCE OF ULCERS  *URINARY PROBLEMS  *BOWEL PROBLEMS  UNUSUAL RASH Items with * indicate a potential emergency and should be followed up as soon as possible.  Feel free to call the clinic you have any questions or concerns. The clinic phone number is [phone].   I have been informed and understand all the instructions given to me. I know to contact the clinic, my physician, or go to the Emergency Department if any problems should occur. I do not have any questions at this time, but understand that I may call the clinic during office hours   should I have any questions or need assistance in obtaining follow up care.    __________________________________________  _____________  __________ Signature of Patient or Authorized Representative            Date                   Time    __________________________________________ Nurse's Signature    

## 2012-05-24 ENCOUNTER — Ambulatory Visit: Payer: BC Managed Care – PPO

## 2012-05-29 ENCOUNTER — Telehealth: Payer: Self-pay | Admitting: *Deleted

## 2012-06-04 ENCOUNTER — Encounter: Payer: Self-pay | Admitting: *Deleted

## 2012-06-04 ENCOUNTER — Telehealth: Payer: Self-pay | Admitting: Oncology

## 2012-06-04 NOTE — Telephone Encounter (Signed)
Moved 3/12 appt from GM to AB. S/w pt she is aware and has new time.

## 2012-06-06 ENCOUNTER — Encounter: Payer: Self-pay | Admitting: Physician Assistant

## 2012-06-06 ENCOUNTER — Other Ambulatory Visit (HOSPITAL_BASED_OUTPATIENT_CLINIC_OR_DEPARTMENT_OTHER): Payer: BC Managed Care – PPO | Admitting: Lab

## 2012-06-06 ENCOUNTER — Ambulatory Visit (HOSPITAL_BASED_OUTPATIENT_CLINIC_OR_DEPARTMENT_OTHER): Payer: BC Managed Care – PPO | Admitting: Physician Assistant

## 2012-06-06 ENCOUNTER — Ambulatory Visit: Payer: BC Managed Care – PPO

## 2012-06-06 ENCOUNTER — Telehealth: Payer: Self-pay | Admitting: Oncology

## 2012-06-06 VITALS — BP 145/95 | HR 94 | Temp 97.8°F | Resp 20 | Ht 67.0 in | Wt 128.3 lb

## 2012-06-06 DIAGNOSIS — C50119 Malignant neoplasm of central portion of unspecified female breast: Secondary | ICD-10-CM

## 2012-06-06 DIAGNOSIS — C50911 Malignant neoplasm of unspecified site of right female breast: Secondary | ICD-10-CM

## 2012-06-06 DIAGNOSIS — Z170 Estrogen receptor positive status [ER+]: Secondary | ICD-10-CM

## 2012-06-06 DIAGNOSIS — Z87898 Personal history of other specified conditions: Secondary | ICD-10-CM

## 2012-06-06 LAB — CBC WITH DIFFERENTIAL/PLATELET
BASO%: 0.6 % (ref 0.0–2.0)
Basophils Absolute: 0 10*3/uL (ref 0.0–0.1)
EOS%: 0.2 % (ref 0.0–7.0)
MCH: 30.9 pg (ref 25.1–34.0)
MCHC: 33.7 g/dL (ref 31.5–36.0)
MCV: 91.7 fL (ref 79.5–101.0)
MONO%: 8.9 % (ref 0.0–14.0)
NEUT%: 71.3 % (ref 38.4–76.8)
RDW: 15.7 % — ABNORMAL HIGH (ref 11.2–14.5)
lymph#: 1 10*3/uL (ref 0.9–3.3)
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
## 2012-06-06 NOTE — Telephone Encounter (Signed)
gv pt appt [facility] for February and March.

## 2012-06-13 ENCOUNTER — Other Ambulatory Visit (HOSPITAL_BASED_OUTPATIENT_CLINIC_OR_DEPARTMENT_OTHER): Payer: BC Managed Care – PPO | Admitting: Lab

## 2012-06-13 ENCOUNTER — Ambulatory Visit (HOSPITAL_BASED_OUTPATIENT_CLINIC_OR_DEPARTMENT_OTHER): Payer: BC Managed Care – PPO

## 2012-06-13 VITALS — BP 153/86 | HR 74 | Temp 98.2°F | Resp 20

## 2012-06-13 DIAGNOSIS — C44509 Unspecified malignant neoplasm of skin of other part of trunk: Secondary | ICD-10-CM

## 2012-06-13 DIAGNOSIS — C801 Malignant (primary) neoplasm, unspecified: Secondary | ICD-10-CM

## 2012-06-13 DIAGNOSIS — C44599 Other specified malignant neoplasm of skin of other part of trunk: Secondary | ICD-10-CM

## 2012-06-13 DIAGNOSIS — C50119 Malignant neoplasm of central portion of unspecified female breast: Secondary | ICD-10-CM

## 2012-06-13 DIAGNOSIS — C50911 Malignant neoplasm of unspecified site of right female breast: Secondary | ICD-10-CM

## 2012-06-13 DIAGNOSIS — Z5112 Encounter for antineoplastic immunotherapy: Secondary | ICD-10-CM

## 2012-06-13 LAB — CBC WITH DIFFERENTIAL/PLATELET
BASO%: 0.5 % (ref 0.0–2.0)
Basophils Absolute: 0 10*3/uL (ref 0.0–0.1)
EOS%: 0.7 % (ref 0.0–7.0)
HGB: 12.7 g/dL (ref 11.6–15.9)
MCH: 31.1 pg (ref 25.1–34.0)
MCV: 92.9 fL (ref 79.5–101.0)
MONO%: 9 % (ref 0.0–14.0)
RBC: 4.08 10*6/uL (ref 3.70–5.45)
RDW: 16.3 % — ABNORMAL HIGH (ref 11.2–14.5)
lymph#: 1.2 10*3/uL (ref 0.9–3.3)
nRBC: 0 % (ref 0–0)

## 2012-06-13 MED ORDER — DEXAMETHASONE SODIUM PHOSPHATE 10 MG/ML IJ SOLN
10.0000 mg | Freq: Once | INTRAMUSCULAR | Status: AC
  Administered 2012-06-13: 10 mg via INTRAVENOUS

## 2012-06-13 MED ORDER — SODIUM CHLORIDE 0.9 % IV SOLN
Freq: Once | INTRAVENOUS | Status: AC
  Administered 2012-06-13: 13:00:00 via INTRAVENOUS

## 2012-06-13 MED ORDER — ONDANSETRON 8 MG/50ML IVPB (CHCC)
8.0000 mg | Freq: Once | INTRAVENOUS | Status: AC
  Administered 2012-06-13: 8 mg via INTRAVENOUS

## 2012-06-13 MED ORDER — TRASTUZUMAB CHEMO INJECTION 440 MG
4.0000 mg/kg | Freq: Once | INTRAVENOUS | Status: AC
  Administered 2012-06-13: 231 mg via INTRAVENOUS
  Filled 2012-06-13: qty 11

## 2012-06-13 MED ORDER — HEPARIN SOD (PORK) LOCK FLUSH 100 UNIT/ML IV SOLN
500.0000 [IU] | Freq: Once | INTRAVENOUS | Status: AC | PRN
  Administered 2012-06-13: 500 [IU]
  Filled 2012-06-13: qty 5

## 2012-06-13 MED ORDER — ACETAMINOPHEN 325 MG PO TABS
650.0000 mg | ORAL_TABLET | Freq: Once | ORAL | Status: AC
  Administered 2012-06-13: 650 mg via ORAL

## 2012-06-13 MED ORDER — SODIUM CHLORIDE 0.9 % IJ SOLN
10.0000 mL | INTRAMUSCULAR | Status: DC | PRN
  Administered 2012-06-13: 10 mL
  Filled 2012-06-13: qty 10

## 2012-06-13 MED ORDER — PACLITAXEL PROTEIN-BOUND CHEMO INJECTION 100 MG
100.0000 mg/m2 | Freq: Once | INTRAVENOUS | Status: AC
  Administered 2012-06-13: 175 mg via INTRAVENOUS
  Filled 2012-06-13: qty 35

## 2012-06-13 MED ORDER — DIPHENHYDRAMINE HCL 25 MG PO CAPS
50.0000 mg | ORAL_CAPSULE | Freq: Once | ORAL | Status: AC
  Administered 2012-06-13: 50 mg via ORAL

## 2012-06-13 NOTE — Patient Instructions (Signed)
Patient aware of next appointment; discharged home with no complaints. 

## 2012-06-14 ENCOUNTER — Ambulatory Visit: Payer: BC Managed Care – PPO

## 2012-06-16 ENCOUNTER — Other Ambulatory Visit: Payer: Self-pay

## 2012-06-20 ENCOUNTER — Other Ambulatory Visit (HOSPITAL_BASED_OUTPATIENT_CLINIC_OR_DEPARTMENT_OTHER): Payer: BC Managed Care – PPO | Admitting: Lab

## 2012-06-20 ENCOUNTER — Ambulatory Visit (HOSPITAL_BASED_OUTPATIENT_CLINIC_OR_DEPARTMENT_OTHER): Payer: BC Managed Care – PPO | Admitting: Oncology

## 2012-06-20 ENCOUNTER — Ambulatory Visit (HOSPITAL_BASED_OUTPATIENT_CLINIC_OR_DEPARTMENT_OTHER): Payer: BC Managed Care – PPO

## 2012-06-20 VITALS — BP 121/87 | HR 102 | Temp 98.0°F | Resp 20 | Ht 67.0 in | Wt 126.7 lb

## 2012-06-20 DIAGNOSIS — C50911 Malignant neoplasm of unspecified site of right female breast: Secondary | ICD-10-CM

## 2012-06-20 DIAGNOSIS — Z5111 Encounter for antineoplastic chemotherapy: Secondary | ICD-10-CM

## 2012-06-20 DIAGNOSIS — Z170 Estrogen receptor positive status [ER+]: Secondary | ICD-10-CM

## 2012-06-20 DIAGNOSIS — C50119 Malignant neoplasm of central portion of unspecified female breast: Secondary | ICD-10-CM

## 2012-06-20 DIAGNOSIS — Z87898 Personal history of other specified conditions: Secondary | ICD-10-CM

## 2012-06-20 DIAGNOSIS — C801 Malignant (primary) neoplasm, unspecified: Secondary | ICD-10-CM

## 2012-06-20 LAB — CBC WITH DIFFERENTIAL/PLATELET
Basophils Absolute: 0 10*3/uL (ref 0.0–0.1)
EOS%: 0.6 % (ref 0.0–7.0)
HCT: 37.2 % (ref 34.8–46.6)
HGB: 12.5 g/dL (ref 11.6–15.9)
MCH: 30.9 pg (ref 25.1–34.0)
MCHC: 33.6 g/dL (ref 31.5–36.0)
MCV: 92.1 fL (ref 79.5–101.0)
MONO%: 5.2 % (ref 0.0–14.0)
NEUT%: 70.2 % (ref 38.4–76.8)

## 2012-06-20 MED ORDER — PACLITAXEL PROTEIN-BOUND CHEMO INJECTION 100 MG
100.0000 mg/m2 | Freq: Once | INTRAVENOUS | Status: AC
  Administered 2012-06-20: 175 mg via INTRAVENOUS
  Filled 2012-06-20: qty 35

## 2012-06-20 MED ORDER — HEPARIN SOD (PORK) LOCK FLUSH 100 UNIT/ML IV SOLN
500.0000 [IU] | Freq: Once | INTRAVENOUS | Status: AC | PRN
  Administered 2012-06-20: 500 [IU]
  Filled 2012-06-20: qty 5

## 2012-06-20 MED ORDER — SODIUM CHLORIDE 0.9 % IV SOLN
Freq: Once | INTRAVENOUS | Status: AC
  Administered 2012-06-20: 12:00:00 via INTRAVENOUS

## 2012-06-20 MED ORDER — ONDANSETRON 8 MG/50ML IVPB (CHCC)
8.0000 mg | Freq: Once | INTRAVENOUS | Status: AC
  Administered 2012-06-20: 8 mg via INTRAVENOUS

## 2012-06-20 MED ORDER — DEXAMETHASONE SODIUM PHOSPHATE 10 MG/ML IJ SOLN
10.0000 mg | Freq: Once | INTRAMUSCULAR | Status: AC
  Administered 2012-06-20: 10 mg via INTRAVENOUS

## 2012-06-20 MED ORDER — SODIUM CHLORIDE 0.9 % IJ SOLN
10.0000 mL | INTRAMUSCULAR | Status: DC | PRN
  Administered 2012-06-20: 10 mL
  Filled 2012-06-20: qty 10

## 2012-06-20 NOTE — Patient Instructions (Addendum)
[facility] Discharge Instructions for Patients Receiving Chemotherapy  Today you received the following chemotherapy agents abraxane  To help prevent nausea and vomiting after your treatment, we encourage you to take your nausea medication  and take it as often as prescribed   If you develop nausea and vomiting that is not controlled by your nausea medication, call the clinic. If it is after clinic hours your family physician or the after hours number for the clinic or go to the Emergency Department.   BELOW ARE SYMPTOMS THAT SHOULD BE REPORTED IMMEDIATELY:  *FEVER GREATER THAN 100.5 F  *CHILLS WITH OR WITHOUT FEVER  NAUSEA AND VOMITING THAT IS NOT CONTROLLED WITH YOUR NAUSEA MEDICATION  *UNUSUAL SHORTNESS OF BREATH  *UNUSUAL BRUISING OR BLEEDING  TENDERNESS IN MOUTH AND THROAT WITH OR WITHOUT PRESENCE OF ULCERS  *URINARY PROBLEMS  *BOWEL PROBLEMS  UNUSUAL RASH Items with * indicate a potential emergency and should be followed up as soon as possible.  One of the nurses will contact you 24 hours after your treatment. Please let the nurse know about any problems that you may have experienced. Feel free to call the clinic you have any questions or concerns. The clinic phone number is [phone].   I have been informed and understand all the instructions given to me. I know to contact the clinic, my physician, or go to the Emergency Department if any problems should occur. I do not have any questions at this time, but understand that I may call the clinic during office hours   should I have any questions or need assistance in obtaining follow up care.    __________________________________________  _____________  __________ Signature of Patient or Authorized Representative            Date                   Time    __________________________________________ Nurse's Signature    
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
## 2012-06-21 ENCOUNTER — Ambulatory Visit (HOSPITAL_COMMUNITY)
Admission: RE | Admit: 2012-06-21 | Discharge: 2012-06-21 | Disposition: A | Discharge status: Home / Self Care | Payer: BC Managed Care – PPO | Source: Ambulatory Visit | Attending: Family Medicine | Admitting: Family Medicine

## 2012-06-21 ENCOUNTER — Ambulatory Visit (HOSPITAL_BASED_OUTPATIENT_CLINIC_OR_DEPARTMENT_OTHER)
Admission: RE | Admit: 2012-06-21 | Discharge: 2012-06-21 | Disposition: A | Discharge status: Home / Self Care | Payer: BC Managed Care – PPO | Source: Ambulatory Visit | Attending: Internal Medicine | Admitting: Internal Medicine

## 2012-06-21 ENCOUNTER — Encounter (HOSPITAL_COMMUNITY): Payer: Self-pay

## 2012-06-21 VITALS — BP 126/78 | HR 83 | Wt 127.2 lb

## 2012-06-21 DIAGNOSIS — I10 Essential (primary) hypertension: Secondary | ICD-10-CM | POA: Insufficient documentation

## 2012-06-21 DIAGNOSIS — C50911 Malignant neoplasm of unspecified site of right female breast: Secondary | ICD-10-CM

## 2012-06-21 DIAGNOSIS — C50919 Malignant neoplasm of unspecified site of unspecified female breast: Secondary | ICD-10-CM

## 2012-06-21 DIAGNOSIS — I517 Cardiomegaly: Secondary | ICD-10-CM

## 2012-06-21 DIAGNOSIS — Z01818 Encounter for other preprocedural examination: Secondary | ICD-10-CM | POA: Insufficient documentation

## 2012-06-21 NOTE — Patient Instructions (Addendum)
Follow up in 3 months with an ECHO 

## 2012-06-21 NOTE — Assessment & Plan Note (Addendum)
Explained purpose of HF clinic as it relates to breast cancer treatment. Explained that ~ 10 % of women develop cardiotoxicity.  Dr Gala Romney reviewed and discussed ECHO. EF and lateral S' stable to continue [name]. Follow up in 3 months with an ECHO.  Patient seen and examined with [name], NP. We discussed all aspects of the encounter. I agree with the assessment and plan as stated above.  Role of cardio-oncology clinic explained in depth. I reviewed echos personally. EF and Doppler parameters stable. No HF on exam. Proceed with [name]. Follow-up q 3months with echo.

## 2012-06-21 NOTE — Progress Notes (Signed)
Echocardiogram 2D Echocardiogram has been performed.  Christina Daniel 06/21/2012, 11:08 AM
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
## 2012-06-26 ENCOUNTER — Other Ambulatory Visit: Payer: Self-pay | Admitting: *Deleted

## 2012-06-26 DIAGNOSIS — C50911 Malignant neoplasm of unspecified site of right female breast: Secondary | ICD-10-CM

## 2012-06-27 ENCOUNTER — Ambulatory Visit (HOSPITAL_BASED_OUTPATIENT_CLINIC_OR_DEPARTMENT_OTHER): Payer: BC Managed Care – PPO

## 2012-06-27 ENCOUNTER — Other Ambulatory Visit (HOSPITAL_BASED_OUTPATIENT_CLINIC_OR_DEPARTMENT_OTHER): Payer: BC Managed Care – PPO | Admitting: Lab

## 2012-06-27 VITALS — BP 128/90 | HR 70 | Temp 98.5°F | Resp 18

## 2012-06-27 DIAGNOSIS — C50119 Malignant neoplasm of central portion of unspecified female breast: Secondary | ICD-10-CM

## 2012-06-27 DIAGNOSIS — Z5111 Encounter for antineoplastic chemotherapy: Secondary | ICD-10-CM

## 2012-06-27 DIAGNOSIS — Z5112 Encounter for antineoplastic immunotherapy: Secondary | ICD-10-CM

## 2012-06-27 DIAGNOSIS — C50911 Malignant neoplasm of unspecified site of right female breast: Secondary | ICD-10-CM

## 2012-06-27 DIAGNOSIS — C801 Malignant (primary) neoplasm, unspecified: Secondary | ICD-10-CM

## 2012-06-27 LAB — CBC WITH DIFFERENTIAL/PLATELET
EOS%: 0.2 % (ref 0.0–7.0)
Eosinophils Absolute: 0 10*3/uL (ref 0.0–0.5)
LYMPH%: 23.7 % (ref 14.0–49.7)
MCH: 31.1 pg (ref 25.1–34.0)
MCV: 93.7 fL (ref 79.5–101.0)
MONO%: 7.1 % (ref 0.0–14.0)
NEUT#: 3.9 10*3/uL (ref 1.5–6.5)
Platelets: 312 10*3/uL (ref 145–400)
RBC: 3.96 10*6/uL (ref 3.70–5.45)
RDW: 15.7 % — ABNORMAL HIGH (ref 11.2–14.5)
nRBC: 0 % (ref 0–0)

## 2012-06-27 LAB — COMPREHENSIVE METABOLIC PANEL (CC13)
ALT: 19 U/L (ref 0–55)
Alkaline Phosphatase: 67 U/L (ref 40–150)
CO2: 26 mEq/L (ref 22–29)
Creatinine: 0.7 mg/dL (ref 0.6–1.1)
Glucose: 93 mg/dl (ref 70–99)
Total Bilirubin: 0.74 mg/dL (ref 0.20–1.20)

## 2012-06-27 MED ORDER — ONDANSETRON 8 MG/50ML IVPB (CHCC)
8.0000 mg | Freq: Once | INTRAVENOUS | Status: AC
  Administered 2012-06-27: 8 mg via INTRAVENOUS

## 2012-06-27 MED ORDER — PACLITAXEL PROTEIN-BOUND CHEMO INJECTION 100 MG
100.0000 mg/m2 | Freq: Once | INTRAVENOUS | Status: AC
  Administered 2012-06-27: 175 mg via INTRAVENOUS
  Filled 2012-06-27: qty 35

## 2012-06-27 MED ORDER — DIPHENHYDRAMINE HCL 25 MG PO CAPS
50.0000 mg | ORAL_CAPSULE | Freq: Once | ORAL | Status: AC
  Administered 2012-06-27: 50 mg via ORAL

## 2012-06-27 MED ORDER — SODIUM CHLORIDE 0.9 % IV SOLN
4.0000 mg/kg | Freq: Once | INTRAVENOUS | Status: AC
  Administered 2012-06-27: 231 mg via INTRAVENOUS
  Filled 2012-06-27: qty 11

## 2012-06-27 MED ORDER — SODIUM CHLORIDE 0.9 % IJ SOLN
10.0000 mL | INTRAMUSCULAR | Status: DC | PRN
  Administered 2012-06-27: 10 mL
  Filled 2012-06-27: qty 10

## 2012-06-27 MED ORDER — ACETAMINOPHEN 325 MG PO TABS
650.0000 mg | ORAL_TABLET | Freq: Once | ORAL | Status: AC
  Administered 2012-06-27: 650 mg via ORAL

## 2012-06-27 MED ORDER — HEPARIN SOD (PORK) LOCK FLUSH 100 UNIT/ML IV SOLN
500.0000 [IU] | Freq: Once | INTRAVENOUS | Status: AC | PRN
  Administered 2012-06-27: 500 [IU]
  Filled 2012-06-27: qty 5

## 2012-06-27 MED ORDER — SODIUM CHLORIDE 0.9 % IV SOLN
Freq: Once | INTRAVENOUS | Status: AC
  Administered 2012-06-27: 13:00:00 via INTRAVENOUS

## 2012-06-27 MED ORDER — DEXAMETHASONE SODIUM PHOSPHATE 10 MG/ML IJ SOLN
10.0000 mg | Freq: Once | INTRAMUSCULAR | Status: AC
  Administered 2012-06-27: 10 mg via INTRAVENOUS

## 2012-06-27 NOTE — Patient Instructions (Signed)
[facility] Discharge Instructions for Patients Receiving Chemotherapy  Today you received the following chemotherapy agents: Abraxane and [name]. To help prevent nausea and vomiting after your treatment, we encourage you to take your nausea medication, Zofran. Begin taking it the morning of 06/28/12 and take it as often as prescribed for the next 72 hours.   If you develop nausea and vomiting that is not controlled by your nausea medication, call the clinic. If it is after clinic hours your family physician or the after hours number for the clinic or go to the Emergency Department.   BELOW ARE SYMPTOMS THAT SHOULD BE REPORTED IMMEDIATELY:  *FEVER GREATER THAN 100.5 F  *CHILLS WITH OR WITHOUT FEVER  NAUSEA AND VOMITING THAT IS NOT CONTROLLED WITH YOUR NAUSEA MEDICATION  *UNUSUAL SHORTNESS OF BREATH  *UNUSUAL BRUISING OR BLEEDING  TENDERNESS IN MOUTH AND THROAT WITH OR WITHOUT PRESENCE OF ULCERS  *URINARY PROBLEMS  *BOWEL PROBLEMS  UNUSUAL RASH Items with * indicate a potential emergency and should be followed up as soon as possible.  Feel free to call the clinic you have any questions or concerns. The clinic phone number is [phone].   I have been informed and understand all the instructions given to me. I know to contact the clinic, my physician, or go to the Emergency Department if any problems should occur. I do not have any questions at this time, but understand that I may call the clinic during office hours   should I have any questions or need assistance in obtaining follow up care.

## 2012-07-02 ENCOUNTER — Ambulatory Visit (HOSPITAL_BASED_OUTPATIENT_CLINIC_OR_DEPARTMENT_OTHER): Payer: BC Managed Care – PPO | Admitting: Genetic Counselor

## 2012-07-02 ENCOUNTER — Other Ambulatory Visit: Payer: BC Managed Care – PPO | Admitting: Lab

## 2012-07-02 DIAGNOSIS — IMO0002 Reserved for concepts with insufficient information to code with codable children: Secondary | ICD-10-CM

## 2012-07-02 DIAGNOSIS — C50911 Malignant neoplasm of unspecified site of right female breast: Secondary | ICD-10-CM

## 2012-07-02 DIAGNOSIS — C50119 Malignant neoplasm of central portion of unspecified female breast: Secondary | ICD-10-CM

## 2012-07-02 DIAGNOSIS — Z853 Personal history of malignant neoplasm of breast: Secondary | ICD-10-CM

## 2012-07-04 ENCOUNTER — Other Ambulatory Visit: Payer: BC Managed Care – PPO | Admitting: Lab

## 2012-07-04 ENCOUNTER — Ambulatory Visit: Payer: BC Managed Care – PPO

## 2012-07-04 ENCOUNTER — Encounter: Payer: Self-pay | Admitting: Genetic Counselor

## 2012-07-04 ENCOUNTER — Ambulatory Visit: Payer: BC Managed Care – PPO | Admitting: Physician Assistant
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
## 2012-07-11 ENCOUNTER — Ambulatory Visit (HOSPITAL_BASED_OUTPATIENT_CLINIC_OR_DEPARTMENT_OTHER): Payer: BC Managed Care – PPO | Admitting: Physician Assistant

## 2012-07-11 ENCOUNTER — Encounter: Payer: Self-pay | Admitting: Physician Assistant

## 2012-07-11 ENCOUNTER — Ambulatory Visit (HOSPITAL_BASED_OUTPATIENT_CLINIC_OR_DEPARTMENT_OTHER): Payer: BC Managed Care – PPO

## 2012-07-11 ENCOUNTER — Telehealth: Payer: Self-pay | Admitting: Oncology

## 2012-07-11 ENCOUNTER — Other Ambulatory Visit (HOSPITAL_BASED_OUTPATIENT_CLINIC_OR_DEPARTMENT_OTHER): Payer: BC Managed Care – PPO | Admitting: Lab

## 2012-07-11 VITALS — BP 124/85 | HR 82 | Temp 98.7°F | Resp 20 | Ht 67.0 in | Wt 126.6 lb

## 2012-07-11 DIAGNOSIS — Z5111 Encounter for antineoplastic chemotherapy: Secondary | ICD-10-CM

## 2012-07-11 DIAGNOSIS — C50119 Malignant neoplasm of central portion of unspecified female breast: Secondary | ICD-10-CM

## 2012-07-11 DIAGNOSIS — Z87898 Personal history of other specified conditions: Secondary | ICD-10-CM

## 2012-07-11 DIAGNOSIS — C50911 Malignant neoplasm of unspecified site of right female breast: Secondary | ICD-10-CM

## 2012-07-11 DIAGNOSIS — C801 Malignant (primary) neoplasm, unspecified: Secondary | ICD-10-CM

## 2012-07-11 DIAGNOSIS — Z5112 Encounter for antineoplastic immunotherapy: Secondary | ICD-10-CM

## 2012-07-11 DIAGNOSIS — Z170 Estrogen receptor positive status [ER+]: Secondary | ICD-10-CM

## 2012-07-11 LAB — CBC WITH DIFFERENTIAL/PLATELET
Basophils Absolute: 0 10*3/uL (ref 0.0–0.1)
Eosinophils Absolute: 0 10*3/uL (ref 0.0–0.5)
HCT: 36.2 % (ref 34.8–46.6)
LYMPH%: 24.3 % (ref 14.0–49.7)
MCV: 94 fL (ref 79.5–101.0)
MONO#: 0.3 10*3/uL (ref 0.1–0.9)
NEUT#: 2.9 10*3/uL (ref 1.5–6.5)
NEUT%: 67.4 % (ref 38.4–76.8)
Platelets: 262 10*3/uL (ref 145–400)
WBC: 4.4 10*3/uL (ref 3.9–10.3)

## 2012-07-11 MED ORDER — TRASTUZUMAB CHEMO INJECTION 440 MG
4.0000 mg/kg | Freq: Once | INTRAVENOUS | Status: AC
  Administered 2012-07-11: 231 mg via INTRAVENOUS
  Filled 2012-07-11: qty 11

## 2012-07-11 MED ORDER — DEXAMETHASONE SODIUM PHOSPHATE 10 MG/ML IJ SOLN
10.0000 mg | Freq: Once | INTRAMUSCULAR | Status: AC
  Administered 2012-07-11: 10 mg via INTRAVENOUS

## 2012-07-11 MED ORDER — SODIUM CHLORIDE 0.9 % IV SOLN
Freq: Once | INTRAVENOUS | Status: AC
  Administered 2012-07-11: 11:00:00 via INTRAVENOUS

## 2012-07-11 MED ORDER — SODIUM CHLORIDE 0.9 % IJ SOLN
10.0000 mL | INTRAMUSCULAR | Status: DC | PRN
  Administered 2012-07-11: 10 mL
  Filled 2012-07-11: qty 10

## 2012-07-11 MED ORDER — DIPHENHYDRAMINE HCL 25 MG PO CAPS
50.0000 mg | ORAL_CAPSULE | Freq: Once | ORAL | Status: AC
  Administered 2012-07-11: 50 mg via ORAL

## 2012-07-11 MED ORDER — ONDANSETRON 8 MG/50ML IVPB (CHCC)
8.0000 mg | Freq: Once | INTRAVENOUS | Status: AC
  Administered 2012-07-11: 8 mg via INTRAVENOUS

## 2012-07-11 MED ORDER — PACLITAXEL PROTEIN-BOUND CHEMO INJECTION 100 MG
100.0000 mg/m2 | Freq: Once | INTRAVENOUS | Status: AC
  Administered 2012-07-11: 175 mg via INTRAVENOUS
  Filled 2012-07-11: qty 35

## 2012-07-11 MED ORDER — ACETAMINOPHEN 325 MG PO TABS
650.0000 mg | ORAL_TABLET | Freq: Once | ORAL | Status: AC
  Administered 2012-07-11: 650 mg via ORAL

## 2012-07-11 MED ORDER — HEPARIN SOD (PORK) LOCK FLUSH 100 UNIT/ML IV SOLN
500.0000 [IU] | Freq: Once | INTRAVENOUS | Status: AC | PRN
  Administered 2012-07-11: 500 [IU]
  Filled 2012-07-11: qty 5
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
## 2012-07-11 NOTE — Patient Instructions (Signed)
[facility] Discharge Instructions for Patients Receiving Chemotherapy  Today you received the following chemotherapy agents abraxane and herceptin.  To help prevent nausea and vomiting after your treatment, we encourage you to take your nausea medication. Begin taking it at  tonight and take it as often as prescribed.   If you develop nausea and vomiting that is not controlled by your nausea medication, call the clinic. If it is after clinic hours your family physician or the after hours number for the clinic or go to the Emergency Department.   BELOW ARE SYMPTOMS THAT SHOULD BE REPORTED IMMEDIATELY:  *FEVER GREATER THAN 100.5 F  *CHILLS WITH OR WITHOUT FEVER  NAUSEA AND VOMITING THAT IS NOT CONTROLLED WITH YOUR NAUSEA MEDICATION  *UNUSUAL SHORTNESS OF BREATH  *UNUSUAL BRUISING OR BLEEDING  TENDERNESS IN MOUTH AND THROAT WITH OR WITHOUT PRESENCE OF ULCERS  *URINARY PROBLEMS  *BOWEL PROBLEMS  UNUSUAL RASH Items with * indicate a potential emergency and should be followed up as soon as possible. . The clinic phone number is [phone].   I have been informed and understand all the instructions given to me. I know to contact the clinic, my physician, or go to the Emergency Department if any problems should occur. I do not have any questions at this time, but understand that I may call the clinic during office hours   should I have any questions or need assistance in obtaining follow up care.    __________________________________________  _____________  __________ Signature of Patient or Authorized Representative            Date                   Time    __________________________________________ Nurse's Signature

## 2012-07-11 NOTE — Telephone Encounter (Signed)
gv pt appt schedule for March and April. lmonvm for [facility] @ heart clinic requesting appt for echo/Bensimhn in May 2015. Echo to [facility] for preauth. Pt aware she will be contacted re appt.

## 2012-07-13 ENCOUNTER — Encounter: Payer: Self-pay | Admitting: Genetic Counselor

## 2012-07-17 ENCOUNTER — Encounter: Payer: Self-pay | Admitting: Oncology

## 2012-07-17 ENCOUNTER — Telehealth: Payer: Self-pay | Admitting: Genetic Counselor

## 2012-07-17 NOTE — Telephone Encounter (Signed)
Revealed negative genetic test results 

## 2012-07-18 ENCOUNTER — Ambulatory Visit (HOSPITAL_BASED_OUTPATIENT_CLINIC_OR_DEPARTMENT_OTHER): Payer: BC Managed Care – PPO

## 2012-07-18 ENCOUNTER — Other Ambulatory Visit (HOSPITAL_BASED_OUTPATIENT_CLINIC_OR_DEPARTMENT_OTHER): Payer: BC Managed Care – PPO | Admitting: Lab

## 2012-07-18 VITALS — BP 138/94 | HR 78 | Temp 99.0°F | Resp 20

## 2012-07-18 DIAGNOSIS — C50911 Malignant neoplasm of unspecified site of right female breast: Secondary | ICD-10-CM

## 2012-07-18 DIAGNOSIS — Z5111 Encounter for antineoplastic chemotherapy: Secondary | ICD-10-CM

## 2012-07-18 DIAGNOSIS — C801 Malignant (primary) neoplasm, unspecified: Secondary | ICD-10-CM

## 2012-07-18 DIAGNOSIS — C50119 Malignant neoplasm of central portion of unspecified female breast: Secondary | ICD-10-CM

## 2012-07-18 LAB — CBC WITH DIFFERENTIAL/PLATELET
Basophils Absolute: 0 10*3/uL (ref 0.0–0.1)
EOS%: 0.3 % (ref 0.0–7.0)
HCT: 37.4 % (ref 34.8–46.6)
HGB: 12.4 g/dL (ref 11.6–15.9)
LYMPH%: 20.9 % (ref 14.0–49.7)
MCH: 31.7 pg (ref 25.1–34.0)
MCV: 95.7 fL (ref 79.5–101.0)
MONO%: 4.8 % (ref 0.0–14.0)
NEUT%: 73.5 % (ref 38.4–76.8)
Platelets: 301 10*3/uL (ref 145–400)
lymph#: 1.4 10*3/uL (ref 0.9–3.3)

## 2012-07-18 MED ORDER — SODIUM CHLORIDE 0.9 % IV SOLN
Freq: Once | INTRAVENOUS | Status: AC
  Administered 2012-07-18: 13:00:00 via INTRAVENOUS

## 2012-07-18 MED ORDER — HEPARIN SOD (PORK) LOCK FLUSH 100 UNIT/ML IV SOLN
500.0000 [IU] | Freq: Once | INTRAVENOUS | Status: AC | PRN
  Administered 2012-07-18: 500 [IU]
  Filled 2012-07-18: qty 5

## 2012-07-18 MED ORDER — SODIUM CHLORIDE 0.9 % IJ SOLN
10.0000 mL | INTRAMUSCULAR | Status: DC | PRN
  Administered 2012-07-18: 10 mL
  Filled 2012-07-18: qty 10

## 2012-07-18 MED ORDER — ONDANSETRON 8 MG/50ML IVPB (CHCC)
8.0000 mg | Freq: Once | INTRAVENOUS | Status: AC
  Administered 2012-07-18: 8 mg via INTRAVENOUS

## 2012-07-18 MED ORDER — DEXAMETHASONE SODIUM PHOSPHATE 10 MG/ML IJ SOLN
10.0000 mg | Freq: Once | INTRAMUSCULAR | Status: AC
  Administered 2012-07-18: 10 mg via INTRAVENOUS

## 2012-07-18 MED ORDER — PACLITAXEL PROTEIN-BOUND CHEMO INJECTION 100 MG
100.0000 mg/m2 | Freq: Once | INTRAVENOUS | Status: AC
  Administered 2012-07-18: 175 mg via INTRAVENOUS
  Filled 2012-07-18: qty 35

## 2012-07-18 NOTE — Patient Instructions (Signed)
Patient aware of next appointment; discharged home with no complaints. 

## 2012-07-20 MED ORDER — MIDAZOLAM HCL 2 MG/2ML IJ SOLN
INTRAMUSCULAR | Status: AC
  Filled 2012-07-20: qty 6

## 2012-07-20 MED ORDER — FENTANYL CITRATE 0.05 MG/ML IJ SOLN
INTRAMUSCULAR | Status: AC
  Filled 2012-07-20: qty 6

## 2012-07-23 ENCOUNTER — Encounter: Payer: Self-pay | Admitting: Genetic Counselor

## 2012-07-25 ENCOUNTER — Ambulatory Visit (HOSPITAL_BASED_OUTPATIENT_CLINIC_OR_DEPARTMENT_OTHER): Payer: BC Managed Care – PPO

## 2012-07-25 ENCOUNTER — Ambulatory Visit (HOSPITAL_BASED_OUTPATIENT_CLINIC_OR_DEPARTMENT_OTHER): Payer: BC Managed Care – PPO | Admitting: Oncology

## 2012-07-25 ENCOUNTER — Other Ambulatory Visit (HOSPITAL_BASED_OUTPATIENT_CLINIC_OR_DEPARTMENT_OTHER): Payer: BC Managed Care – PPO | Admitting: Lab

## 2012-07-25 VITALS — BP 115/71 | HR 94 | Temp 98.2°F | Resp 20 | Ht 67.0 in | Wt 127.3 lb

## 2012-07-25 DIAGNOSIS — C50119 Malignant neoplasm of central portion of unspecified female breast: Secondary | ICD-10-CM

## 2012-07-25 DIAGNOSIS — Z5112 Encounter for antineoplastic immunotherapy: Secondary | ICD-10-CM

## 2012-07-25 DIAGNOSIS — Z170 Estrogen receptor positive status [ER+]: Secondary | ICD-10-CM

## 2012-07-25 DIAGNOSIS — C50911 Malignant neoplasm of unspecified site of right female breast: Secondary | ICD-10-CM

## 2012-07-25 DIAGNOSIS — Z5111 Encounter for antineoplastic chemotherapy: Secondary | ICD-10-CM

## 2012-07-25 DIAGNOSIS — C801 Malignant (primary) neoplasm, unspecified: Secondary | ICD-10-CM

## 2012-07-25 LAB — CBC WITH DIFFERENTIAL/PLATELET
BASO%: 0.4 % (ref 0.0–2.0)
Basophils Absolute: 0 10*3/uL (ref 0.0–0.1)
Eosinophils Absolute: 0 10*3/uL (ref 0.0–0.5)
HCT: 35.4 % (ref 34.8–46.6)
HGB: 11.7 g/dL (ref 11.6–15.9)
LYMPH%: 18.9 % (ref 14.0–49.7)
MCHC: 33.1 g/dL (ref 31.5–36.0)
MONO#: 0.2 10*3/uL (ref 0.1–0.9)
NEUT%: 76.4 % (ref 38.4–76.8)
Platelets: 300 10*3/uL (ref 145–400)
WBC: 5.4 10*3/uL (ref 3.9–10.3)

## 2012-07-25 LAB — COMPREHENSIVE METABOLIC PANEL (CC13)
BUN: 18.8 mg/dL (ref 7.0–26.0)
CO2: 27 mEq/L (ref 22–29)
Calcium: 9.1 mg/dL (ref 8.4–10.4)
Chloride: 106 mEq/L (ref 98–107)
Creatinine: 0.7 mg/dL (ref 0.6–1.1)
Glucose: 96 mg/dl (ref 70–99)

## 2012-07-25 MED ORDER — ONDANSETRON 8 MG/50ML IVPB (CHCC)
8.0000 mg | Freq: Once | INTRAVENOUS | Status: AC
  Administered 2012-07-25: 8 mg via INTRAVENOUS

## 2012-07-25 MED ORDER — SODIUM CHLORIDE 0.9 % IJ SOLN
10.0000 mL | INTRAMUSCULAR | Status: DC | PRN
  Administered 2012-07-25: 10 mL
  Filled 2012-07-25: qty 10

## 2012-07-25 MED ORDER — SODIUM CHLORIDE 0.9 % IV SOLN
Freq: Once | INTRAVENOUS | Status: AC
  Administered 2012-07-25: 14:00:00 via INTRAVENOUS

## 2012-07-25 MED ORDER — DEXAMETHASONE SODIUM PHOSPHATE 10 MG/ML IJ SOLN
10.0000 mg | Freq: Once | INTRAMUSCULAR | Status: AC
  Administered 2012-07-25: 10 mg via INTRAVENOUS

## 2012-07-25 MED ORDER — PACLITAXEL PROTEIN-BOUND CHEMO INJECTION 100 MG
100.0000 mg/m2 | Freq: Once | INTRAVENOUS | Status: AC
  Administered 2012-07-25: 175 mg via INTRAVENOUS
  Filled 2012-07-25: qty 35

## 2012-07-25 MED ORDER — TRASTUZUMAB CHEMO INJECTION 440 MG
6.0000 mg/kg | Freq: Once | INTRAVENOUS | Status: AC
  Administered 2012-07-25: 357 mg via INTRAVENOUS
  Filled 2012-07-25: qty 17

## 2012-07-25 MED ORDER — HEPARIN SOD (PORK) LOCK FLUSH 100 UNIT/ML IV SOLN
500.0000 [IU] | Freq: Once | INTRAVENOUS | Status: AC | PRN
  Administered 2012-07-25: 500 [IU]
  Filled 2012-07-25: qty 5

## 2012-07-25 MED ORDER — ACETAMINOPHEN 325 MG PO TABS
650.0000 mg | ORAL_TABLET | Freq: Once | ORAL | Status: AC
  Administered 2012-07-25: 650 mg via ORAL

## 2012-07-25 MED ORDER — DIPHENHYDRAMINE HCL 25 MG PO CAPS
50.0000 mg | ORAL_CAPSULE | Freq: Once | ORAL | Status: AC
  Administered 2012-07-25: 50 mg via ORAL

## 2012-07-25 NOTE — Patient Instructions (Addendum)
[facility] Discharge Instructions for Patients Receiving Chemotherapy  Today you received the following chemotherapy agents Abraxane/[name].  To help prevent nausea and vomiting after your treatment, we encourage you to take your nausea medication as prescribed.   If you develop nausea and vomiting that is not controlled by your nausea medication, call the clinic. If it is after clinic hours your family physician or the after hours number for the clinic or go to the Emergency Department.   BELOW ARE SYMPTOMS THAT SHOULD BE REPORTED IMMEDIATELY:  *FEVER GREATER THAN 100.5 F  *CHILLS WITH OR WITHOUT FEVER  NAUSEA AND VOMITING THAT IS NOT CONTROLLED WITH YOUR NAUSEA MEDICATION  *UNUSUAL SHORTNESS OF BREATH  *UNUSUAL BRUISING OR BLEEDING  TENDERNESS IN MOUTH AND THROAT WITH OR WITHOUT PRESENCE OF ULCERS  *URINARY PROBLEMS  *BOWEL PROBLEMS  UNUSUAL RASH Items with * indicate a potential emergency and should be followed up as soon as possible.  Feel free to call the clinic you have any questions or concerns. The clinic phone number is [phone].   I have been informed and understand all the instructions given to me. I know to contact the clinic, my physician, or go to the Emergency Department if any problems should occur. I do not have any questions at this time, but understand that I may call the clinic during office hours   should I have any questions or need assistance in obtaining follow up care.    __________________________________________  _____________  __________ Signature of Patient or Authorized Representative            Date                   Time    __________________________________________ Nurse's Signature
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
## 2012-07-26 ENCOUNTER — Encounter: Payer: Self-pay | Admitting: Oncology

## 2012-07-26 NOTE — Progress Notes (Signed)
Received copay card from [facility] for [name].  Maximum 75-month benefit is $24,000 effective 04/18/12 - 04/18/13. I will forward letter to the billing dept.

## 2012-07-30 ENCOUNTER — Telehealth: Payer: Self-pay | Admitting: *Deleted

## 2012-07-30 ENCOUNTER — Telehealth: Payer: Self-pay | Admitting: Oncology

## 2012-07-30 NOTE — Telephone Encounter (Signed)
LMONVM ADVISING THE PT OF HER BONE DENSITY APPT ON 08/29/2012@10 :30AM

## 2012-07-30 NOTE — Telephone Encounter (Signed)
Per staff message and POF I have scheduled appts.  [name]  

## 2012-07-30 NOTE — Telephone Encounter (Signed)
S/W THE PT AND SHE IS AWARE OF HER 08/17/2012 APPTS@1 :00PM AND TO PICK UP THE REST OF HER SCHEDULES AT THAT TIME.

## 2012-08-06 ENCOUNTER — Ambulatory Visit: Payer: BC Managed Care – PPO

## 2012-08-14 ENCOUNTER — Telehealth: Payer: Self-pay | Admitting: Oncology

## 2012-08-14 NOTE — Telephone Encounter (Signed)
May echo/Dr. Bensimhon added. S/w pt today to confirm and she does have appts. preauth in progress - s/w Bonita Quin today.

## 2012-08-15 ENCOUNTER — Other Ambulatory Visit: Payer: BC Managed Care – PPO

## 2012-08-15 ENCOUNTER — Ambulatory Visit: Payer: BC Managed Care – PPO

## 2012-08-17 ENCOUNTER — Other Ambulatory Visit: Payer: BC Managed Care – PPO | Admitting: Lab

## 2012-08-17 ENCOUNTER — Ambulatory Visit (HOSPITAL_BASED_OUTPATIENT_CLINIC_OR_DEPARTMENT_OTHER): Payer: BC Managed Care – PPO

## 2012-08-17 ENCOUNTER — Other Ambulatory Visit: Payer: Self-pay | Admitting: Oncology

## 2012-08-17 ENCOUNTER — Other Ambulatory Visit (HOSPITAL_BASED_OUTPATIENT_CLINIC_OR_DEPARTMENT_OTHER): Payer: BC Managed Care – PPO | Admitting: Lab

## 2012-08-17 VITALS — BP 123/83 | HR 86 | Temp 98.6°F | Resp 18

## 2012-08-17 DIAGNOSIS — C50911 Malignant neoplasm of unspecified site of right female breast: Secondary | ICD-10-CM

## 2012-08-17 DIAGNOSIS — C50119 Malignant neoplasm of central portion of unspecified female breast: Secondary | ICD-10-CM

## 2012-08-17 DIAGNOSIS — Z5112 Encounter for antineoplastic immunotherapy: Secondary | ICD-10-CM

## 2012-08-17 LAB — COMPREHENSIVE METABOLIC PANEL (CC13)
ALT: 16 U/L (ref 0–55)
Albumin: 3.7 g/dL (ref 3.5–5.0)
CO2: 24 mEq/L (ref 22–29)
Calcium: 9 mg/dL (ref 8.4–10.4)
Chloride: 107 mEq/L (ref 98–107)
Potassium: 3.8 mEq/L (ref 3.5–5.1)
Sodium: 141 mEq/L (ref 136–145)
Total Protein: 7 g/dL (ref 6.4–8.3)

## 2012-08-17 LAB — CBC WITH DIFFERENTIAL/PLATELET
Basophils Absolute: 0 10*3/uL (ref 0.0–0.1)
EOS%: 0.2 % (ref 0.0–7.0)
HGB: 12.2 g/dL (ref 11.6–15.9)
MCH: 32 pg (ref 25.1–34.0)
MCV: 96.3 fL (ref 79.5–101.0)
MONO%: 7.6 % (ref 0.0–14.0)
NEUT%: 72.8 % (ref 38.4–76.8)
RDW: 15 % — ABNORMAL HIGH (ref 11.2–14.5)

## 2012-08-17 MED ORDER — TRASTUZUMAB CHEMO INJECTION 440 MG
6.0000 mg/kg | Freq: Once | INTRAVENOUS | Status: AC
  Administered 2012-08-17: 357 mg via INTRAVENOUS
  Filled 2012-08-17: qty 17

## 2012-08-17 MED ORDER — SODIUM CHLORIDE 0.9 % IJ SOLN
10.0000 mL | INTRAMUSCULAR | Status: DC | PRN
  Administered 2012-08-17: 10 mL
  Filled 2012-08-17: qty 10

## 2012-08-17 MED ORDER — HEPARIN SOD (PORK) LOCK FLUSH 100 UNIT/ML IV SOLN
500.0000 [IU] | Freq: Once | INTRAVENOUS | Status: AC | PRN
  Administered 2012-08-17: 500 [IU]
  Filled 2012-08-17: qty 5

## 2012-08-17 MED ORDER — ACETAMINOPHEN 325 MG PO TABS
650.0000 mg | ORAL_TABLET | Freq: Once | ORAL | Status: AC
  Administered 2012-08-17: 650 mg via ORAL

## 2012-08-17 MED ORDER — DIPHENHYDRAMINE HCL 25 MG PO CAPS
50.0000 mg | ORAL_CAPSULE | Freq: Once | ORAL | Status: AC
  Administered 2012-08-17: 50 mg via ORAL

## 2012-08-17 MED ORDER — SODIUM CHLORIDE 0.9 % IV SOLN
Freq: Once | INTRAVENOUS | Status: AC
  Administered 2012-08-17: 14:00:00 via INTRAVENOUS

## 2012-08-17 NOTE — Patient Instructions (Addendum)

## 2012-08-20 ENCOUNTER — Ambulatory Visit: Payer: BC Managed Care – PPO

## 2012-08-29 ENCOUNTER — Ambulatory Visit
Admission: RE | Admit: 2012-08-29 | Discharge: 2012-08-29 | Disposition: A | Discharge status: Home / Self Care | Payer: BC Managed Care – PPO | Source: Ambulatory Visit | Attending: Oncology | Admitting: Oncology

## 2012-08-29 DIAGNOSIS — C50911 Malignant neoplasm of unspecified site of right female breast: Secondary | ICD-10-CM

## 2012-09-07 ENCOUNTER — Ambulatory Visit (HOSPITAL_BASED_OUTPATIENT_CLINIC_OR_DEPARTMENT_OTHER): Payer: BC Managed Care – PPO | Admitting: Oncology

## 2012-09-07 ENCOUNTER — Ambulatory Visit (HOSPITAL_BASED_OUTPATIENT_CLINIC_OR_DEPARTMENT_OTHER): Payer: BC Managed Care – PPO

## 2012-09-07 ENCOUNTER — Other Ambulatory Visit (HOSPITAL_BASED_OUTPATIENT_CLINIC_OR_DEPARTMENT_OTHER): Payer: BC Managed Care – PPO | Admitting: Lab

## 2012-09-07 ENCOUNTER — Other Ambulatory Visit: Payer: Self-pay | Admitting: Oncology

## 2012-09-07 ENCOUNTER — Telehealth: Payer: Self-pay | Admitting: *Deleted

## 2012-09-07 VITALS — BP 113/75 | HR 83 | Temp 98.8°F | Resp 20 | Ht 67.0 in | Wt 125.6 lb

## 2012-09-07 DIAGNOSIS — M810 Age-related osteoporosis without current pathological fracture: Secondary | ICD-10-CM

## 2012-09-07 DIAGNOSIS — C50119 Malignant neoplasm of central portion of unspecified female breast: Secondary | ICD-10-CM

## 2012-09-07 DIAGNOSIS — Z5112 Encounter for antineoplastic immunotherapy: Secondary | ICD-10-CM

## 2012-09-07 DIAGNOSIS — Z170 Estrogen receptor positive status [ER+]: Secondary | ICD-10-CM

## 2012-09-07 DIAGNOSIS — C50911 Malignant neoplasm of unspecified site of right female breast: Secondary | ICD-10-CM

## 2012-09-07 LAB — COMPREHENSIVE METABOLIC PANEL (CC13)
ALT: 15 U/L (ref 0–55)
AST: 19 U/L (ref 5–34)
Alkaline Phosphatase: 61 U/L (ref 40–150)
Chloride: 106 mEq/L (ref 98–107)
Creatinine: 0.7 mg/dL (ref 0.6–1.1)
Total Bilirubin: 0.86 mg/dL (ref 0.20–1.20)

## 2012-09-07 LAB — CBC WITH DIFFERENTIAL/PLATELET
BASO%: 0.6 % (ref 0.0–2.0)
EOS%: 0.6 % (ref 0.0–7.0)
HCT: 42 % (ref 34.8–46.6)
LYMPH%: 22.4 % (ref 14.0–49.7)
MCH: 31.8 pg (ref 25.1–34.0)
MCHC: 33.6 g/dL (ref 31.5–36.0)
MONO%: 5.7 % (ref 0.0–14.0)
NEUT%: 70.7 % (ref 38.4–76.8)
lymph#: 1.1 10*3/uL (ref 0.9–3.3)

## 2012-09-07 MED ORDER — HEPARIN SOD (PORK) LOCK FLUSH 100 UNIT/ML IV SOLN
500.0000 [IU] | Freq: Once | INTRAVENOUS | Status: AC | PRN
  Administered 2012-09-07: 500 [IU]
  Filled 2012-09-07: qty 5

## 2012-09-07 MED ORDER — DIPHENHYDRAMINE HCL 25 MG PO CAPS
50.0000 mg | ORAL_CAPSULE | Freq: Once | ORAL | Status: AC
  Administered 2012-09-07: 50 mg via ORAL

## 2012-09-07 MED ORDER — SODIUM CHLORIDE 0.9 % IJ SOLN
10.0000 mL | INTRAMUSCULAR | Status: DC | PRN
  Administered 2012-09-07: 10 mL
  Filled 2012-09-07: qty 10

## 2012-09-07 MED ORDER — TRASTUZUMAB CHEMO INJECTION 440 MG
6.0000 mg/kg | Freq: Once | INTRAVENOUS | Status: AC
  Administered 2012-09-07: 357 mg via INTRAVENOUS
  Filled 2012-09-07: qty 17

## 2012-09-07 MED ORDER — SODIUM CHLORIDE 0.9 % IV SOLN
Freq: Once | INTRAVENOUS | Status: AC
  Administered 2012-09-07: 11:00:00 via INTRAVENOUS

## 2012-09-07 MED ORDER — ACETAMINOPHEN 325 MG PO TABS
650.0000 mg | ORAL_TABLET | Freq: Once | ORAL | Status: AC
  Administered 2012-09-07: 650 mg via ORAL

## 2012-09-07 MED ORDER — TAMOXIFEN CITRATE 20 MG PO TABS: 20.0000 mg | ORAL_TABLET | Freq: Every day | ORAL | Status: DC

## 2012-09-07 NOTE — Patient Instructions (Addendum)

## 2012-09-07 NOTE — Telephone Encounter (Signed)
appts made and printed...td 
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
## 2012-09-13 ENCOUNTER — Ambulatory Visit (HOSPITAL_COMMUNITY)
Admission: RE | Admit: 2012-09-13 | Discharge: 2012-09-13 | Disposition: A | Discharge status: Home / Self Care | Payer: BC Managed Care – PPO | Source: Ambulatory Visit | Attending: Family Medicine | Admitting: Family Medicine

## 2012-09-13 ENCOUNTER — Encounter (HOSPITAL_COMMUNITY): Payer: Self-pay

## 2012-09-13 ENCOUNTER — Ambulatory Visit (HOSPITAL_BASED_OUTPATIENT_CLINIC_OR_DEPARTMENT_OTHER)
Admission: RE | Admit: 2012-09-13 | Discharge: 2012-09-13 | Disposition: A | Discharge status: Home / Self Care | Payer: BC Managed Care – PPO | Source: Ambulatory Visit | Attending: Internal Medicine | Admitting: Internal Medicine

## 2012-09-13 VITALS — BP 128/78 | HR 106 | Ht 67.0 in | Wt 125.0 lb

## 2012-09-13 DIAGNOSIS — Z09 Encounter for follow-up examination after completed treatment for conditions other than malignant neoplasm: Secondary | ICD-10-CM

## 2012-09-13 DIAGNOSIS — C50911 Malignant neoplasm of unspecified site of right female breast: Secondary | ICD-10-CM

## 2012-09-13 DIAGNOSIS — E785 Hyperlipidemia, unspecified: Secondary | ICD-10-CM | POA: Insufficient documentation

## 2012-09-13 DIAGNOSIS — C50919 Malignant neoplasm of unspecified site of unspecified female breast: Secondary | ICD-10-CM | POA: Insufficient documentation

## 2012-09-13 DIAGNOSIS — R000 Tachycardia, unspecified: Secondary | ICD-10-CM | POA: Insufficient documentation

## 2012-09-13 DIAGNOSIS — I10 Essential (primary) hypertension: Secondary | ICD-10-CM | POA: Insufficient documentation
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
## 2012-09-13 NOTE — Assessment & Plan Note (Addendum)
Dr Gala Romney discussed and reviewed ECHO. EF and lateral S' stable. No evidence of cardiotoxicity. Follow up in 3 months with an ECHO.   Patient seen and examined with [name], NP. We discussed all aspects of the encounter. I agree with the assessment and plan as stated above. I reviewed echos personally. EF and Doppler parameters stable. No HF on exam. Continue [name].

## 2012-09-13 NOTE — Progress Notes (Signed)
*  PRELIMINARY RESULTS* Echocardiogram 2D Echocardiogram has been performed.  Christina Daniel 09/13/2012, 10:58 AM

## 2012-09-13 NOTE — Patient Instructions (Addendum)
Follow up in 3 months with an ECHO 

## 2012-09-27 ENCOUNTER — Other Ambulatory Visit: Payer: Self-pay | Admitting: Plastic Surgery

## 2012-09-27 DIAGNOSIS — Z9011 Acquired absence of right breast and nipple: Secondary | ICD-10-CM

## 2012-09-27 NOTE — H&P (Deleted)
This document contains confidential information from a [facility] medical record system and may be unauthenticated. Release may be made only with a valid authorization or in accordance with applicable policies of Medical Center or its affiliates. This document must be maintained in a secure manner or discarded/destroyed as required by Medical Center policy or by a confidential means such as shredding.   Christina Daniel  09/25/2012 11:00 AM   Office Visit  MRN:  [id number]  Department:  Plastic Surgery  Dept Phone: [phone]  Description: Female DOB: Aug 06, 1950  Provider: Fanny Bien Rayburn, PA-C   Diagnoses -  Acquired absence of right breast and nipple       V45.71    Breast cancer, right       174.9     Vitals - Last Recorded    138/89  84  55.792 kg (123 lb)  19.26 kg/m2          Subjective:    Patient ID: Christina Daniel is a 62 y.o. female.  HPI She noted a change in the right nipple and some nodules at the site of a prior lumpectomy scar. Dr. Jamey Ripa did a biopsy (01/27/12) which showed invasive ductal carcinoma. The nipple had become somewhat inverted and crusting over the last several months. The nodules were noted a year ago and have enlarged. A right breast lumpectomy was done in 1993 for DCIS. At the same time LCIS was also found. Postoperative radiation was performed but no chemotherapy. She was a 34B bra, she is 5 feet 7 inches tall and weighs 121 pounds. She is continuing to do well. She tolerated the last expander fill well.  She does not want to be any large and even a little smaller. She would like to have the left breast mastopexy for symmetry. Her shirts don't fit with the expander.   She is currently 340cc/275cc.              She has been doing well with [name] treatments. She is ready for the exchange surgery with  silicone implant placement for right breast reconstruction and left breast mastopexy for symmetry.   She returns today for sizing,  but also wants to see if she can have another fill on the right side in order to be a little larger. She also wants to return later today for sizing on the left.  The following portions of the patient's history were reviewed and updated as appropriate: allergies, current medications, past family history, past medical history, past social history, past surgical history and problem list.  Review of Systems  All other systems reviewed and are negative.     Objective:    Physical Exam  Constitutional: She is oriented to person, place, and time. She appears well-developed and well-nourished. No distress.  Neck: Normal range of motion. Neck supple.  Cardiovascular: Normal rate, regular rhythm and intact distal pulses.   Pulmonary/Chest: Effort normal. No respiratory distress.  Musculoskeletal: Normal range of motion.  Neurological: She is alert and oriented to person, place, and time.  Skin: Skin is warm and dry.  Psychiatric: She has a normal mood and affect. Her behavior is normal. Judgment and thought content normal.   Assessment:   1.  Acquired absence of right breast and nipple    2.  Breast cancer, right        Plan:   The right expander was filled with 20 cc of sterile injectable saline for a total of 340/275  cc . She will return this afternoon for sizing on the left side when she has a regular bra.     Medications Ordered This Encounter    cephalexin (KEFLEX) 500 MG capsule Take 1 capsule (500 mg total) by mouth 4 times daily.     HYDROcodone-acetaminophen (NORCO) 5-325 mg per tablet Take 1 tablet by mouth every 6 (six) hours as needed for 10 days for Pain.    diazepam (VALIUM) 2 MG tablet  Take 1 tablet (2 mg total) by mouth every 6 (six) hours as needed for 10 days for Anxiety.    promethazine (PHENERGAN) 12.5 MG tablet  Take 1 tablet (12.5 mg total) by mouth every 6 (six) hours as needed for 7 days for Nausea.

## 2012-09-27 NOTE — H&P (Signed)
This document contains confidential information from a [facility] medical record system and may be unauthenticated. Release may be made only with a valid authorization or in accordance with applicable policies of Medical Center or its affiliates. This document must be maintained in a secure manner or discarded/destroyed as required by Medical Center policy or by a confidential means such as shredding.   Christina Daniel  09/25/2012 11:00 AM   Office Visit  MRN:  [id number]  Department:  Plastic Surgery  Dept Phone: [phone]  Description: Female DOB: [birth year]  Provider: Fanny Bien Rayburn, PA-C   Diagnoses -  Acquired absence of right breast and nipple       V45.71    Breast cancer, right       174.9     Vitals - Last Recorded    138/89  84  55.792 kg (123 lb)  19.26 kg/m2          Subjective:    Patient ID: Christina Daniel is a 62 y.o. female.  HPI She noted a change in the right nipple and some nodules at the site of a prior lumpectomy scar. Dr. Jamey Ripa did a biopsy (01/27/12) which showed invasive ductal carcinoma. The nipple had become somewhat inverted and crusting over the last several months. The nodules were noted a year ago and have enlarged. A right breast lumpectomy was done in 1993 for DCIS. At the same time LCIS was also found. Postoperative radiation was performed but no chemotherapy. She was a 34B bra, she is 5 feet 7 inches tall and weighs 121 pounds. She is continuing to do well. She tolerated the last expander fill well.  She does not want to be any large and even a little smaller. She would like to have the left breast mastopexy for symmetry. Her shirts don't fit with the expander.   She is currently 340cc/275cc.              She has been doing well with [name] treatments. She is ready for the exchange surgery with  silicone implant placement for right breast reconstruction and left breast mastopexy for symmetry.   She returns today for sizing,  but also wants to see if she can have another fill on the right side in order to be a little larger. She also wants to return later today for sizing on the left.  The following portions of the patient's history were reviewed and updated as appropriate: allergies, current medications, past family history, past medical history, past social history, past surgical history and problem list.  Review of Systems  All other systems reviewed and are negative.     Objective:    Physical Exam  Constitutional: She is oriented to person, place, and time. She appears well-developed and well-nourished. No distress.  Neck: Normal range of motion. Neck supple.  Cardiovascular: Normal rate, regular rhythm and intact distal pulses.   Pulmonary/Chest: Effort normal. No respiratory distress.  Musculoskeletal: Normal range of motion.  Neurological: She is alert and oriented to person, place, and time.  Skin: Skin is warm and dry.  Psychiatric: She has a normal mood and affect. Her behavior is normal. Judgment and thought content normal.   Assessment:   1.  Acquired absence of right breast and nipple    2.  Breast cancer, right        Plan:   The right expander was filled with 20 cc of sterile injectable saline for a total of 340/275  cc. The left size is between 100-150cc. She will return this afternoon for sizing on the left side when she has a regular bra.     Medications Ordered This Encounter    cephalexin (KEFLEX) 500 MG capsule Take 1 capsule (500 mg total) by mouth 4 times daily.     HYDROcodone-acetaminophen (NORCO) 5-325 mg per tablet Take 1 tablet by mouth every 6 (six) hours as needed for 10 days for Pain.    diazepam (VALIUM) 2 MG tablet  Take 1 tablet (2 mg total) by mouth every 6 (six) hours as needed for 10 days for Anxiety.    promethazine (PHENERGAN) 12.5 MG tablet  Take 1 tablet (12.5 mg total) by mouth every 6 (six) hours as needed for 7 days for Nausea.

## 2012-09-28 ENCOUNTER — Other Ambulatory Visit (HOSPITAL_BASED_OUTPATIENT_CLINIC_OR_DEPARTMENT_OTHER): Payer: BC Managed Care – PPO | Admitting: Lab

## 2012-09-28 ENCOUNTER — Ambulatory Visit (HOSPITAL_BASED_OUTPATIENT_CLINIC_OR_DEPARTMENT_OTHER): Payer: BC Managed Care – PPO

## 2012-09-28 VITALS — BP 130/92 | HR 73 | Temp 98.5°F | Resp 18

## 2012-09-28 DIAGNOSIS — C50911 Malignant neoplasm of unspecified site of right female breast: Secondary | ICD-10-CM

## 2012-09-28 DIAGNOSIS — C50119 Malignant neoplasm of central portion of unspecified female breast: Secondary | ICD-10-CM

## 2012-09-28 DIAGNOSIS — Z5112 Encounter for antineoplastic immunotherapy: Secondary | ICD-10-CM

## 2012-09-28 LAB — CBC WITH DIFFERENTIAL/PLATELET
Eosinophils Absolute: 0 10*3/uL (ref 0.0–0.5)
HCT: 39.5 % (ref 34.8–46.6)
LYMPH%: 31.6 % (ref 14.0–49.7)
MONO#: 0.3 10*3/uL (ref 0.1–0.9)
NEUT#: 2.5 10*3/uL (ref 1.5–6.5)
NEUT%: 60.8 % (ref 38.4–76.8)
Platelets: 248 10*3/uL (ref 145–400)
RBC: 4.2 10*6/uL (ref 3.70–5.45)
WBC: 4.2 10*3/uL (ref 3.9–10.3)
lymph#: 1.3 10*3/uL (ref 0.9–3.3)

## 2012-09-28 LAB — COMPREHENSIVE METABOLIC PANEL (CC13)
AST: 20 U/L (ref 5–34)
Alkaline Phosphatase: 49 U/L (ref 40–150)
BUN: 19.3 mg/dL (ref 7.0–26.0)
Glucose: 87 mg/dl (ref 70–99)
Total Bilirubin: 0.89 mg/dL (ref 0.20–1.20)

## 2012-09-28 MED ORDER — SODIUM CHLORIDE 0.9 % IV SOLN
Freq: Once | INTRAVENOUS | Status: AC
  Administered 2012-09-28: 14:00:00 via INTRAVENOUS

## 2012-09-28 MED ORDER — SODIUM CHLORIDE 0.9 % IJ SOLN
10.0000 mL | INTRAMUSCULAR | Status: DC | PRN
  Administered 2012-09-28: 10 mL
  Filled 2012-09-28: qty 10

## 2012-09-28 MED ORDER — ACETAMINOPHEN 325 MG PO TABS
650.0000 mg | ORAL_TABLET | Freq: Once | ORAL | Status: AC
  Administered 2012-09-28: 650 mg via ORAL

## 2012-09-28 MED ORDER — TRASTUZUMAB CHEMO INJECTION 440 MG
6.0000 mg/kg | Freq: Once | INTRAVENOUS | Status: AC
  Administered 2012-09-28: 357 mg via INTRAVENOUS
  Filled 2012-09-28: qty 17

## 2012-09-28 MED ORDER — HEPARIN SOD (PORK) LOCK FLUSH 100 UNIT/ML IV SOLN
500.0000 [IU] | Freq: Once | INTRAVENOUS | Status: AC | PRN
  Administered 2012-09-28: 500 [IU]
  Filled 2012-09-28: qty 5

## 2012-09-28 MED ORDER — DIPHENHYDRAMINE HCL 25 MG PO CAPS
50.0000 mg | ORAL_CAPSULE | Freq: Once | ORAL | Status: AC
  Administered 2012-09-28: 50 mg via ORAL

## 2012-09-28 NOTE — Patient Instructions (Signed)

## 2012-10-05 ENCOUNTER — Encounter (HOSPITAL_BASED_OUTPATIENT_CLINIC_OR_DEPARTMENT_OTHER): Payer: Self-pay | Admitting: *Deleted

## 2012-10-05 NOTE — Progress Notes (Signed)
To come in for bmet 10/10/12

## 2012-10-08 ENCOUNTER — Other Ambulatory Visit: Payer: Self-pay | Admitting: Plastic Surgery

## 2012-10-08 NOTE — H&P (Signed)
This document contains confidential information from a [facility] medical record system and may be unauthenticated. Release may be made only with a valid authorization or in accordance with applicable policies of Medical Center or its affiliates. This document must be maintained in a secure manner or discarded/destroyed as required by Medical Center policy or by a confidential means such as shredding.    Christina Daniel  09/25/2012 11:00 AM   Office Visit  MRN:  [id number]  Department: [facility]  Dept Phone: [phone]  Description: Female DOB: [birth year]  Provider: Fanny Bien Rayburn, PA-C    Diagnoses    Acquired absence of right breast and nipple       V45.71    Breast cancer, right       174.9    Vitals - Last Recorded   138/89  84  55.792 kg (123 lb)  19.26 kg/m2         Subjective:     Patient ID: Christina Daniel is a 62 y.o. female.   HPI She noted a change in the right nipple and some nodules at the site of a prior lumpectomy scar. Dr. Jamey Ripa did a biopsy (01/27/12) which showed invasive ductal carcinoma. The nipple had become somewhat inverted and crusting over the last several months. The nodules were noted a year ago and have enlarged. A right breast lumpectomy was done in 1993 for DCIS. At the same time LCIS was also found. Postoperative radiation was performed but no chemotherapy. She was a 34B bra, she is 5 feet 7 inches tall and weighs 121 pounds. She is continuing to do well. She tolerated the last expander fill well.  She does not want to be any large and even a little smaller. She would like to have the left breast mastopexy for symmetry. Her shirts don't fit with the expander.   She is currently 340cc/275cc.              She has been doing well with [name] treatments. She is ready for the exchange surgery with  silicone implant placement for right breast reconstruction and left breast mastopexy for symmetry.   She returns today for  sizing, but also wants to see if she can have another fill on the right side in order to be a little larger. She also wants to return later today for sizing on the left.  The following portions of the patient's history were reviewed and updated as appropriate: allergies, current medications, past family history, past medical history, past social history, past surgical history and problem list.  Review of Systems  All other systems reviewed and are negative.     Objective:   Physical Exam  Constitutional: She is oriented to person, place, and time. She appears well-developed and well-nourished. No distress.  Neck: Normal range of motion. Neck supple.  Cardiovascular: Normal rate, regular rhythm and intact distal pulses.   Pulmonary/Chest: Effort normal. No respiratory distress.  Musculoskeletal: Normal range of motion.  Neurological: She is alert and oriented to person, place, and time.  Skin: Skin is warm and dry.  Psychiatric: She has a normal mood and affect. Her behavior is normal. Judgment and thought content normal.      Assessment:   1.  Acquired absence of right breast and nipple    2.  Breast cancer, right     Plan:   The right expander was filled with 20 cc of sterile injectable saline for a total of 340/275  cc . The left side was between 100-150 cc.       Medications Ordered This Encounter    cephalexin (KEFLEX) 500 MG capsule Take 1 capsule (500 mg total) by mouth 4 times daily.     HYDROcodone-acetaminophen (NORCO) 5-325 mg per tablet Take 1 tablet by mouth every 6 (six) hours as needed for 10 days for Pain.    diazepam (VALIUM) 2 MG tablet 30 Take 1 tablet (2 mg total) by mouth every 6 (six) hours as needed for 10 days for Anxiety.     promethazine (PHENERGAN) 12.5 MG tablet 30 Take 1 tablet (12.5 mg total) by mouth every 6 (six) hours as needed for 7 days for Nausea.

## 2012-10-09 ENCOUNTER — Encounter: Payer: Self-pay | Admitting: Physician Assistant

## 2012-10-10 ENCOUNTER — Encounter (HOSPITAL_BASED_OUTPATIENT_CLINIC_OR_DEPARTMENT_OTHER)
Admission: RE | Admit: 2012-10-10 | Discharge: 2012-10-10 | Disposition: A | Discharge status: Home / Self Care | Payer: BC Managed Care – PPO | Source: Ambulatory Visit | Attending: Plastic Surgery | Admitting: Plastic Surgery

## 2012-10-10 LAB — BASIC METABOLIC PANEL
BUN: 17 mg/dL (ref 6–23)
Creatinine, Ser: 0.63 mg/dL (ref 0.50–1.10)
GFR calc Af Amer: >90 mL/min (ref >90)
GFR calc non Af Amer: >90 mL/min (ref >90)

## 2012-10-11 ENCOUNTER — Ambulatory Visit (HOSPITAL_BASED_OUTPATIENT_CLINIC_OR_DEPARTMENT_OTHER)
Admission: RE | Admit: 2012-10-11 | Discharge: 2012-10-11 | Disposition: A | Discharge status: Home / Self Care | Payer: BC Managed Care – PPO | Source: Ambulatory Visit | Attending: Plastic Surgery | Admitting: Plastic Surgery

## 2012-10-11 ENCOUNTER — Encounter (HOSPITAL_BASED_OUTPATIENT_CLINIC_OR_DEPARTMENT_OTHER): Payer: Self-pay | Admitting: Anesthesiology

## 2012-10-11 ENCOUNTER — Encounter (HOSPITAL_BASED_OUTPATIENT_CLINIC_OR_DEPARTMENT_OTHER)
Admission: RE | Disposition: A | Discharge status: Home / Self Care | Payer: Self-pay | Source: Ambulatory Visit | Attending: Plastic Surgery

## 2012-10-11 ENCOUNTER — Ambulatory Visit (HOSPITAL_BASED_OUTPATIENT_CLINIC_OR_DEPARTMENT_OTHER): Payer: BC Managed Care – PPO | Admitting: Anesthesiology

## 2012-10-11 DIAGNOSIS — Z853 Personal history of malignant neoplasm of breast: Secondary | ICD-10-CM | POA: Insufficient documentation

## 2012-10-11 DIAGNOSIS — Z9010 Acquired absence of unspecified breast and nipple: Secondary | ICD-10-CM | POA: Insufficient documentation

## 2012-10-11 DIAGNOSIS — I739 Peripheral vascular disease, unspecified: Secondary | ICD-10-CM | POA: Insufficient documentation

## 2012-10-11 DIAGNOSIS — Z9011 Acquired absence of right breast and nipple: Secondary | ICD-10-CM

## 2012-10-11 DIAGNOSIS — N651 Disproportion of reconstructed breast: Secondary | ICD-10-CM | POA: Insufficient documentation

## 2012-10-11 DIAGNOSIS — I10 Essential (primary) hypertension: Secondary | ICD-10-CM | POA: Insufficient documentation

## 2012-10-11 HISTORY — PX: MASTOPEXY: SHX5358

## 2012-10-11 SURGERY — REMOVAL, TISSUE EXPANDER, BREAST, BILATERAL, WITH BILATERAL IMPLANT IMPLANT INSERTION
Anesthesia: General | Site: Breast | Laterality: Right | Wound class: Clean

## 2012-10-11 MED ORDER — LACTATED RINGERS IV SOLN
INTRAVENOUS | Status: DC
  Administered 2012-10-11 (×2): via INTRAVENOUS

## 2012-10-11 MED ORDER — SODIUM CHLORIDE 0.9 % IR SOLN
Status: DC | PRN
  Administered 2012-10-11: 09:00:00

## 2012-10-11 MED ORDER — HYDROMORPHONE HCL PF 1 MG/ML IJ SOLN
0.2500 mg | INTRAMUSCULAR | Status: DC | PRN
  Administered 2012-10-11: 0.25 mg via INTRAVENOUS
  Administered 2012-10-11 (×2): 0.5 mg via INTRAVENOUS

## 2012-10-11 MED ORDER — MIDAZOLAM HCL 5 MG/5ML IJ SOLN
INTRAMUSCULAR | Status: DC | PRN
  Administered 2012-10-11: 2 mg via INTRAVENOUS

## 2012-10-11 MED ORDER — ONDANSETRON HCL 4 MG/2ML IJ SOLN
INTRAMUSCULAR | Status: DC | PRN
  Administered 2012-10-11: 4 mg via INTRAVENOUS

## 2012-10-11 MED ORDER — OXYCODONE HCL 5 MG PO TABS: 5.0000 mg | ORAL_TABLET | Freq: Once | ORAL | Status: DC | PRN

## 2012-10-11 MED ORDER — PROPOFOL 10 MG/ML IV BOLUS
INTRAVENOUS | Status: DC | PRN
  Administered 2012-10-11: 200 mg via INTRAVENOUS

## 2012-10-11 MED ORDER — DEXAMETHASONE SODIUM PHOSPHATE 4 MG/ML IJ SOLN
INTRAMUSCULAR | Status: DC | PRN
  Administered 2012-10-11: 8 mg via INTRAVENOUS

## 2012-10-11 MED ORDER — OXYCODONE HCL 5 MG/5ML PO SOLN: 5.0000 mg | Freq: Once | ORAL | Status: DC | PRN

## 2012-10-11 MED ORDER — SUCCINYLCHOLINE CHLORIDE 20 MG/ML IJ SOLN
INTRAMUSCULAR | Status: DC | PRN
  Administered 2012-10-11: 80 mg via INTRAVENOUS

## 2012-10-11 MED ORDER — SCOPOLAMINE 1 MG/3DAYS TD PT72
1.0000 | MEDICATED_PATCH | TRANSDERMAL | Status: DC
  Administered 2012-10-11: 1.5 mg via TRANSDERMAL

## 2012-10-11 MED ORDER — EPHEDRINE SULFATE 50 MG/ML IJ SOLN
INTRAMUSCULAR | Status: DC | PRN
  Administered 2012-10-11: 10 mg via INTRAVENOUS

## 2012-10-11 MED ORDER — LIDOCAINE-EPINEPHRINE 1 %-1:100000 IJ SOLN
INTRAMUSCULAR | Status: DC | PRN
  Administered 2012-10-11: 20 mL

## 2012-10-11 MED ORDER — FENTANYL CITRATE 0.05 MG/ML IJ SOLN
INTRAMUSCULAR | Status: DC | PRN
  Administered 2012-10-11 (×2): 50 ug via INTRAVENOUS

## 2012-10-11 MED ORDER — LIDOCAINE HCL (CARDIAC) 20 MG/ML IV SOLN
INTRAVENOUS | Status: DC | PRN
  Administered 2012-10-11: 50 mg via INTRAVENOUS

## 2012-10-11 MED ORDER — CIPROFLOXACIN IN D5W 400 MG/200ML IV SOLN
400.0000 mg | INTRAVENOUS | Status: AC
  Administered 2012-10-11: 400 mg via INTRAVENOUS

## 2012-10-11 MED ORDER — METOCLOPRAMIDE HCL 5 MG/ML IJ SOLN: 10.0000 mg | Freq: Once | INTRAMUSCULAR | Status: DC | PRN

## 2012-10-11 SURGICAL SUPPLY — 76 items
ADH SKN CLS APL DERMABOND .7 (GAUZE/BANDAGES/DRESSINGS) ×4
BAG DECANTER FOR FLEXI CONT (MISCELLANEOUS) ×3 IMPLANT
BANDAGE GAUZE ELAST BULKY 4 IN (GAUZE/BANDAGES/DRESSINGS) ×6 IMPLANT
BINDER BREAST LRG (GAUZE/BANDAGES/DRESSINGS) IMPLANT
BINDER BREAST MEDIUM (GAUZE/BANDAGES/DRESSINGS) ×1 IMPLANT
BINDER BREAST XLRG (GAUZE/BANDAGES/DRESSINGS) IMPLANT
BINDER BREAST XXLRG (GAUZE/BANDAGES/DRESSINGS) IMPLANT
BIOPATCH RED 1 DISK 7.0 (GAUZE/BANDAGES/DRESSINGS) IMPLANT
BLADE HEX COATED 2.75 (ELECTRODE) ×3 IMPLANT
BLADE SURG 10 STRL SS (BLADE) ×3 IMPLANT
BLADE SURG 15 STRL LF DISP TIS (BLADE) ×2 IMPLANT
BLADE SURG 15 STRL SS (BLADE) ×3
CANISTER SUCTION 1200CC (MISCELLANEOUS) ×3 IMPLANT
CHLORAPREP W/TINT 26ML (MISCELLANEOUS) ×3 IMPLANT
CLOTH BEACON ORANGE TIMEOUT ST (SAFETY) ×3 IMPLANT
CORDS BIPOLAR (ELECTRODE) IMPLANT
COVER MAYO STAND STRL (DRAPES) ×3 IMPLANT
COVER TABLE BACK 60X90 (DRAPES) ×3 IMPLANT
DECANTER SPIKE VIAL GLASS SM (MISCELLANEOUS) IMPLANT
DERMABOND ADVANCED (GAUZE/BANDAGES/DRESSINGS) ×2
DERMABOND ADVANCED .7 DNX12 (GAUZE/BANDAGES/DRESSINGS) ×2 IMPLANT
DRAIN CHANNEL 19F RND (DRAIN) IMPLANT
DRAPE LAPAROSCOPIC ABDOMINAL (DRAPES) ×3 IMPLANT
DRSG PAD ABDOMINAL 8X10 ST (GAUZE/BANDAGES/DRESSINGS) ×6 IMPLANT
DRSG TEGADERM 2-3/8X2-3/4 SM (GAUZE/BANDAGES/DRESSINGS) IMPLANT
ELECT BLADE 4.0 EZ CLEAN MEGAD (MISCELLANEOUS) ×3
ELECT REM PT RETURN 9FT ADLT (ELECTROSURGICAL) ×3
ELECTRODE BLDE 4.0 EZ CLN MEGD (MISCELLANEOUS) ×2 IMPLANT
ELECTRODE REM PT RTRN 9FT ADLT (ELECTROSURGICAL) ×2 IMPLANT
EVACUATOR SILICONE 100CC (DRAIN) IMPLANT
GAUZE SPONGE 4X4 12PLY STRL LF (GAUZE/BANDAGES/DRESSINGS) IMPLANT
GLOVE BIO SURGEON STRL SZ 6.5 (GLOVE) ×4 IMPLANT
GOWN PREVENTION PLUS XLARGE (GOWN DISPOSABLE) ×8 IMPLANT
IMPL BREAST GEL MP 125CC (Breast) IMPLANT
IMPL GEL 300CC (Breast) IMPLANT
IMPLANT BREAST GEL MP 125CC (Breast) ×3 IMPLANT
IMPLANT GEL 300CC (Breast) ×3 IMPLANT
IV NS 1000ML (IV SOLUTION)
IV NS 1000ML BAXH (IV SOLUTION) IMPLANT
IV NS 500ML (IV SOLUTION)
IV NS 500ML BAXH (IV SOLUTION) ×2 IMPLANT
KIT FILL SYSTEM UNIVERSAL (SET/KITS/TRAYS/PACK) IMPLANT
NDL HYPO 25X1 1.5 SAFETY (NEEDLE) ×2 IMPLANT
NDL SAFETY ECLIPSE 18X1.5 (NEEDLE) ×2 IMPLANT
NEEDLE HYPO 18GX1.5 SHARP (NEEDLE) ×3
NEEDLE HYPO 25X1 1.5 SAFETY (NEEDLE) ×3 IMPLANT
NS IRRIG 1000ML POUR BTL (IV SOLUTION) ×3 IMPLANT
PACK BASIN DAY SURGERY FS (CUSTOM PROCEDURE TRAY) ×3 IMPLANT
PENCIL BUTTON HOLSTER BLD 10FT (ELECTRODE) ×3 IMPLANT
PIN SAFETY STERILE (MISCELLANEOUS) IMPLANT
SIZER BREAST GEL REUSE 300CC (SIZER) ×3
SIZER BREAST GEL REUSE 325CC (SIZER) ×3
SIZER BREAST REUSE 150CC (SIZER) ×3
SIZER BRST GEL REUSE 300CC (SIZER) IMPLANT
SIZER BRST GEL REUSE 325CC (SIZER) IMPLANT
SIZER BRST REUSE 150CC (SIZER) IMPLANT
SIZER GENERIC MENTOR (SIZER) ×3 IMPLANT
SLEEVE SCD COMPRESS KNEE MED (MISCELLANEOUS) ×3 IMPLANT
SPONGE GAUZE 4X4 12PLY (GAUZE/BANDAGES/DRESSINGS) IMPLANT
SPONGE LAP 18X18 X RAY DECT (DISPOSABLE) ×6 IMPLANT
STRIP CLOSURE SKIN 1/2X4 (GAUZE/BANDAGES/DRESSINGS) ×3 IMPLANT
SUT MNCRL AB 4-0 PS2 18 (SUTURE) IMPLANT
SUT MON AB 5-0 PS2 18 (SUTURE) ×4 IMPLANT
SUT PDS AB 2-0 CT2 27 (SUTURE) ×2 IMPLANT
SUT SILK 3 0 PS 1 (SUTURE) IMPLANT
SUT VIC AB 3-0 SH 27 (SUTURE) ×6
SUT VIC AB 3-0 SH 27X BRD (SUTURE) ×2 IMPLANT
SUT VICRYL 4-0 PS2 18IN ABS (SUTURE) ×6 IMPLANT
SWABSTICK POVIDONE IODINE SNGL (MISCELLANEOUS) ×1 IMPLANT
SYR 50ML LL SCALE MARK (SYRINGE) IMPLANT
SYR BULB IRRIGATION 50ML (SYRINGE) ×3 IMPLANT
SYR CONTROL 10ML LL (SYRINGE) ×3 IMPLANT
TOWEL OR 17X24 6PK STRL BLUE (TOWEL DISPOSABLE) ×6 IMPLANT
TUBE CONNECTING 20X1/4 (TUBING) ×3 IMPLANT
UNDERPAD 30X30 INCONTINENT (UNDERPADS AND DIAPERS) ×6 IMPLANT
YANKAUER SUCT BULB TIP NO VENT (SUCTIONS) ×3 IMPLANT

## 2012-10-11 NOTE — Anesthesia Preprocedure Evaluation (Signed)
Anesthesia Evaluation  Patient identified by MRN, date of birth, ID band Patient awake    Reviewed: Allergy & Precautions, H&P , NPO status , Patient's Chart, lab work & pertinent test results, reviewed documented beta blocker date and time   History of Anesthesia Complications (+) AWARENESS UNDER ANESTHESIA  Airway Mallampati: II TM Distance: >3 FB Neck ROM: full    Dental   Pulmonary neg pulmonary ROS,  breath sounds clear to auscultation        Cardiovascular hypertension, On Medications and On Home Beta Blockers + Peripheral Vascular Disease negative cardio ROS  + dysrhythmias Rhythm:regular     Neuro/Psych PSYCHIATRIC DISORDERS negative neurological ROS     GI/Hepatic negative GI ROS, Neg liver ROS,   Endo/Other  negative endocrine ROS  Renal/GU negative Renal ROS  negative genitourinary   Musculoskeletal   Abdominal   Peds  Hematology negative hematology ROS (+)   Anesthesia Other Findings See surgeon's H&P   Reproductive/Obstetrics negative OB ROS                           Anesthesia Physical Anesthesia Plan  ASA: III  Anesthesia Plan: General   Post-op Pain Management:    Induction: Intravenous  Airway Management Planned: Oral ETT  Additional Equipment:   Intra-op Plan:   Post-operative Plan: Extubation in OR  Informed Consent: I have reviewed the patients History and Physical, chart, labs and discussed the procedure including the risks, benefits and alternatives for the proposed anesthesia with the patient or authorized representative who has indicated his/her understanding and acceptance.   Dental Advisory Given  Plan Discussed with: CRNA and Surgeon  Anesthesia Plan Comments:         Anesthesia Quick Evaluation

## 2012-10-11 NOTE — Transfer of Care (Signed)
Immediate Anesthesia Transfer of Care Note  Patient: Christina Daniel  Procedure(s) Performed: Procedure(s): REMOVAL OF RIGHT BREAST TISSUE EXPANDER WITH PLACEMENT OF RIGHT BREAST IMPLANT (Right) PLACEMENT OF IMPLANT AND MASTOPEXY TO LEFT BREAST (Left)  Patient Location: PACU  Anesthesia Type:General  Level of Consciousness: awake and sedated  Airway & Oxygen Therapy: Patient Spontanous Breathing and Patient connected to face mask oxygen  Post-op Assessment: Report given to PACU RN and Post -op Vital signs reviewed and stable  Post vital signs: Reviewed and stable  Complications: No apparent anesthesia complications

## 2012-10-11 NOTE — Anesthesia Postprocedure Evaluation (Signed)
Anesthesia Post Note  Patient: Christina Daniel  Procedure(s) Performed: Procedure(s) (LRB): REMOVAL OF RIGHT BREAST TISSUE EXPANDER WITH PLACEMENT OF RIGHT BREAST IMPLANT (Right) PLACEMENT OF IMPLANT AND MASTOPEXY TO LEFT BREAST (Left)  Anesthesia type: General  Patient location: PACU  Post pain: Pain level controlled  Post assessment: Patient's Cardiovascular Status Stable  Last Vitals:  Filed Vitals:   10/11/12 1115  BP: 130/76  Pulse: 88  Temp:   Resp: 10    Post vital signs: Reviewed and stable  Level of consciousness: alert  Complications: No apparent anesthesia complications

## 2012-10-11 NOTE — Interval H&P Note (Signed)
History and Physical Interval Note:  10/11/2012 7:08 AM  Christina Daniel  has presented today for surgery, with the diagnosis of Personal History of Breast cancer, Acquired absence of breast and nipple  The various methods of treatment have been discussed with the patient and family. After consideration of risks, benefits and other options for treatment, the patient has consented to  Procedure(s): REMOVAL OF RIGHT BREAST TISSUE EXPANDER WITH PLACEMENT OF RIGHT BREAST IMPLANT (Right) PLACEMENT OF IMPLANT AND MASTOPEXY TO LEFT BREAST (Left) as a surgical intervention .  The patient's history has been reviewed, patient examined, no change in status, stable for surgery.  I have reviewed the patient's chart and labs.  Questions were answered to the patient's satisfaction.     SANGER,Tra Wilemon

## 2012-10-11 NOTE — Op Note (Signed)
Op report    SURGICAL DIVISION: Plastic Surgery  PREOPERATIVE DIAGNOSES:  1. History of right breast cancer.  2. Acquired absence of right breast.  3. Acquired left breast asymmetry after breast surgery.  POSTOPERATIVE DIAGNOSES:  1. History of right breast cancer.  2. Acquired absence of right breast.  3. Acquired left breast asymmetry after breast surgery.  PROCEDURE:  1. Right exchange of tissue expander for implant.  2. Left breast implant placement.  SURGEON: Wayland Denis, DO  ASSISTANT: Shawn Rayburn, PA  ANESTHESIA:  General.   COMPLICATIONS: None.   IMPLANTS: Left - Mentor Smooth Round round moderate plus profile Gel 125cc. Ref #[id number].  Serial Number [id number] Right - Mentor Smooth Round High Profile Gel 300cc. Ref #[id number].  Serial Number [id number]  INDICATIONS FOR PROCEDURE:  The patient has a history of a right mastectomy and had tissue expanders placed at the time of the mastectomy. She now presents for exchange of her expander for an implant.  She requires capsulotomies to better position the implant.   CONSENT:  Informed consent was obtained directly from the patient. Risks, benefits and alternatives were fully discussed. Specific risks including but not limited to bleeding, infection, hematoma, seroma, scarring, pain, implant infection, implant extrusion, capsular contracture, asymmetry, wound healing problems, and need for further surgery were all discussed. The patient did have an ample opportunity to have her questions answered to her satisfaction.   DESCRIPTION OF PROCEDURE:  The patient was taken to the operating room. SCDs were placed and IV antibiotics were given. The patient's chest was prepped and draped in a sterile fashion. A time out was performed and the implants to be used were identified.  One percent Xylocaine with epinephrine was used to infiltrate the area.   The right old mastectomy scar was opened at the inferior border of the  latissimus flap.  The expander was located and drained.  The expander was then removed.  Inspection of the pocket showed a normal healthy capsule.  Circumferential capsulotomies were performed to allow for breast pocket expansion.  Measurements were made on either side to confirm adequate pocket size for the implant dimensions.  Hemostasis was ensured and the pockets were both irrigated with antibiotic solution. A lateral capsulectomy was performed to reposition the capsule space more medially.  This was closed with 2-0 PDS.  Goves were changed. A sizer was placed on the right and thought to be too large at 325cc but fit well with the 300cc sizer.  Therefore the 300 cc implant was selected.  An inframammary incision of 2 cm was made on the left breast and a submuscular pocket was created.  A 150 cc size was selected and thought to be too large.  Therefore a 125 cc implant was placed.  Both deep layers were closed with 3-0 vicryl followed by 4-0 vicryl and the skin was re-approximated with 5-0 monocryl.   Dermabond and sterile dressings were applied.  A breast binder was placed. The patient was allowed to wake up from anesthesia and taken to the recovery room in satisfactory condition.

## 2012-10-11 NOTE — Brief Op Note (Signed)
10/11/2012  9:19 AM  PATIENT:  Christina Daniel  62 y.o. female  PRE-OPERATIVE DIAGNOSIS:  Personal History of Breast cancer, Acquired absence of breast and nipple  POST-OPERATIVE DIAGNOSIS:  Personal History of Breast cancer, Acquired absence of breast and nipple  PROCEDURE:  Procedure(s): REMOVAL OF RIGHT BREAST TISSUE EXPANDER WITH PLACEMENT OF RIGHT BREAST IMPLANT (Right) PLACEMENT OF IMPLANT AND MASTOPEXY TO LEFT BREAST (Left)  SURGEON:  Surgeon(s) and Role:    * Claire Sanger, DO - Primary  PHYSICIAN ASSISTANT: Shawn Rayburn, PA  ASSISTANTS: none   ANESTHESIA:   general  EBL:  Total I/O In: 1000 [I.V.:1000] Out: -   BLOOD ADMINISTERED:none  DRAINS: none   LOCAL MEDICATIONS USED:  LIDOCAINE   SPECIMEN:  Source of Specimen:  right lateral capsule  DISPOSITION OF SPECIMEN:  PATHOLOGY  COUNTS:  YES  TOURNIQUET:  * No tourniquets in log *  DICTATION: .Dragon Dictation  PLAN OF CARE: Discharge to home after PACU  PATIENT DISPOSITION:  PACU - hemodynamically stable.   Delay start of Pharmacological VTE agent (>24hrs) due to surgical blood loss or risk of bleeding: no

## 2012-10-11 NOTE — H&P (View-Only) (Signed)
This document contains confidential information from a [facility] medical record system and may be unauthenticated. Release may be made only with a valid authorization or in accordance with applicable policies of Medical Center or its affiliates. This document must be maintained in a secure manner or discarded/destroyed as required by Medical Center policy or by a confidential means such as shredding.    Christina Daniel  09/25/2012 11:00 AM   Office Visit  MRN:  [id number]  Department: [facility]  Dept Phone: [phone]  Description: Female DOB: 02/18/1951  Provider: Shawn Montgomery Rayburn, PA-C    Diagnoses    Acquired absence of right breast and nipple       V45.71    Breast cancer, right       174.9    Vitals - Last Recorded   138/89  84  55.792 kg (123 lb)  19.26 kg/m2         Subjective:     Patient ID: Christina Daniel is a 61 y.o. female.   HPI She noted a change in the right nipple and some nodules at the site of a prior lumpectomy scar. Dr. Streck did a biopsy (01/27/12) which showed invasive ductal carcinoma. The nipple had become somewhat inverted and crusting over the last several months. The nodules were noted a year ago and have enlarged. A right breast lumpectomy was done in 1993 for DCIS. At the same time LCIS was also found. Postoperative radiation was performed but no chemotherapy. She was a 34B bra, she is 5 feet 7 inches tall and weighs 121 pounds. She is continuing to do well. She tolerated the last expander fill well.  She does not want to be any large and even a little smaller. She would like to have the left breast mastopexy for symmetry. Her shirts don't fit with the expander.   She is currently 340cc/275cc.              She has been doing well with [name] treatments. She is ready for the exchange surgery with  silicone implant placement for right breast reconstruction and left breast mastopexy for symmetry.   She returns today for  sizing, but also wants to see if she can have another fill on the right side in order to be a little larger. She also wants to return later today for sizing on the left.  The following portions of the patient's history were reviewed and updated as appropriate: allergies, current medications, past family history, past medical history, past social history, past surgical history and problem list.  Review of Systems  All other systems reviewed and are negative.     Objective:   Physical Exam  Constitutional: She is oriented to person, place, and time. She appears well-developed and well-nourished. No distress.  Neck: Normal range of motion. Neck supple.  Cardiovascular: Normal rate, regular rhythm and intact distal pulses.   Pulmonary/Chest: Effort normal. No respiratory distress.  Musculoskeletal: Normal range of motion.  Neurological: She is alert and oriented to person, place, and time.  Skin: Skin is warm and dry.  Psychiatric: She has a normal mood and affect. Her behavior is normal. Judgment and thought content normal.      Assessment:   1.  Acquired absence of right breast and nipple    2.  Breast cancer, right     Plan:   The right expander was filled with 20 cc of sterile injectable saline for a total of 340/275   cc . The left side was between 100-150 cc.       Medications Ordered This Encounter    cephalexin (KEFLEX) 500 MG capsule Take 1 capsule (500 mg total) by mouth 4 times daily.     HYDROcodone-acetaminophen (NORCO) 5-325 mg per tablet Take 1 tablet by mouth every 6 (six) hours as needed for 10 days for Pain.    diazepam (VALIUM) 2 MG tablet 30 Take 1 tablet (2 mg total) by mouth every 6 (six) hours as needed for 10 days for Anxiety.     promethazine (PHENERGAN) 12.5 MG tablet 30 Take 1 tablet (12.5 mg total) by mouth every 6 (six) hours as needed for 7 days for Nausea.     

## 2012-10-11 NOTE — Anesthesia Procedure Notes (Signed)
Procedure Name: Intubation Date/Time: 10/11/2012 7:42 AM Performed by: Zenia Resides D Pre-anesthesia Checklist: Patient identified, Emergency Drugs available, Suction available and Patient being monitored Patient Re-evaluated:Patient Re-evaluated prior to inductionOxygen Delivery Method: Circle System Utilized Preoxygenation: Pre-oxygenation with 100% oxygen Intubation Type: IV induction Ventilation: Mask ventilation without difficulty Laryngoscope Size: Mac and 3 Grade View: Grade I Tube type: Oral Number of attempts: 1 Airway Equipment and Method: stylet and oral airway Placement Confirmation: ETT inserted through vocal cords under direct vision,  positive ETCO2 and breath sounds checked- equal and bilateral Secured at: 21 cm Tube secured with: Tape Dental Injury: Teeth and Oropharynx as per pre-operative assessment

## 2012-10-12 ENCOUNTER — Encounter (HOSPITAL_BASED_OUTPATIENT_CLINIC_OR_DEPARTMENT_OTHER): Payer: Self-pay | Admitting: Plastic Surgery

## 2012-10-12 LAB — POCT HEMOGLOBIN-HEMACUE: Hemoglobin: 13.7 g/dL (ref 12.0–15.0)

## 2012-10-19 ENCOUNTER — Ambulatory Visit (HOSPITAL_BASED_OUTPATIENT_CLINIC_OR_DEPARTMENT_OTHER): Payer: BC Managed Care – PPO

## 2012-10-19 ENCOUNTER — Telehealth: Payer: Self-pay | Admitting: *Deleted

## 2012-10-19 ENCOUNTER — Other Ambulatory Visit: Payer: Self-pay | Admitting: *Deleted

## 2012-10-19 ENCOUNTER — Other Ambulatory Visit (HOSPITAL_BASED_OUTPATIENT_CLINIC_OR_DEPARTMENT_OTHER): Payer: BC Managed Care – PPO | Admitting: Lab

## 2012-10-19 VITALS — BP 121/74 | HR 84 | Temp 98.1°F

## 2012-10-19 DIAGNOSIS — C50119 Malignant neoplasm of central portion of unspecified female breast: Secondary | ICD-10-CM

## 2012-10-19 DIAGNOSIS — Z5112 Encounter for antineoplastic immunotherapy: Secondary | ICD-10-CM

## 2012-10-19 DIAGNOSIS — C50911 Malignant neoplasm of unspecified site of right female breast: Secondary | ICD-10-CM

## 2012-10-19 LAB — COMPREHENSIVE METABOLIC PANEL (CC13)
ALT: 18 U/L (ref 0–55)
AST: 17 U/L (ref 5–34)
Alkaline Phosphatase: 58 U/L (ref 40–150)
Chloride: 106 mEq/L (ref 98–107)
Creatinine: 0.7 mg/dL (ref 0.6–1.1)
Total Bilirubin: 0.55 mg/dL (ref 0.20–1.20)

## 2012-10-19 LAB — CBC WITH DIFFERENTIAL/PLATELET
BASO%: 0.3 % (ref 0.0–2.0)
EOS%: 0.3 % (ref 0.0–7.0)
LYMPH%: 21.4 % (ref 14.0–49.7)
MCH: 30.6 pg (ref 25.1–34.0)
MCHC: 33.1 g/dL (ref 31.5–36.0)
MONO#: 0.4 10*3/uL (ref 0.1–0.9)
MONO%: 7 % (ref 0.0–14.0)
Platelets: 280 10*3/uL (ref 145–400)
RBC: 4.54 10*6/uL (ref 3.70–5.45)
WBC: 6.1 10*3/uL (ref 3.9–10.3)
nRBC: 0 % (ref 0–0)

## 2012-10-19 MED ORDER — HEPARIN SOD (PORK) LOCK FLUSH 100 UNIT/ML IV SOLN
500.0000 [IU] | Freq: Once | INTRAVENOUS | Status: AC | PRN
  Administered 2012-10-19: 500 [IU]
  Filled 2012-10-19: qty 5

## 2012-10-19 MED ORDER — SODIUM CHLORIDE 0.9 % IJ SOLN
10.0000 mL | INTRAMUSCULAR | Status: DC | PRN
  Administered 2012-10-19: 10 mL
  Filled 2012-10-19: qty 10

## 2012-10-19 MED ORDER — ACETAMINOPHEN 325 MG PO TABS
650.0000 mg | ORAL_TABLET | Freq: Once | ORAL | Status: AC
  Administered 2012-10-19: 650 mg via ORAL

## 2012-10-19 MED ORDER — TRASTUZUMAB CHEMO INJECTION 440 MG
6.0000 mg/kg | Freq: Once | INTRAVENOUS | Status: AC
  Administered 2012-10-19: 357 mg via INTRAVENOUS
  Filled 2012-10-19: qty 17

## 2012-10-19 MED ORDER — DIPHENHYDRAMINE HCL 25 MG PO CAPS
50.0000 mg | ORAL_CAPSULE | Freq: Once | ORAL | Status: AC
  Administered 2012-10-19: 50 mg via ORAL

## 2012-10-19 MED ORDER — SODIUM CHLORIDE 0.9 % IV SOLN
Freq: Once | INTRAVENOUS | Status: AC
  Administered 2012-10-19: 14:00:00 via INTRAVENOUS

## 2012-10-19 NOTE — Telephone Encounter (Signed)
Pt decided to cancel her appts for 12/04/12 because she is going out of town and hse r/s. gv appt d/t for 11/29/12@ 12noon.Marland Kitchenappts was made and printed. Pt is aware that i will email MW to adjust her tx.Marland Kitchentd

## 2012-10-19 NOTE — Telephone Encounter (Signed)
appts made and printed...td 

## 2012-10-19 NOTE — Patient Instructions (Addendum)
[facility] Discharge Instructions for Patients Receiving Chemotherapy  Today you received the following chemotherapy agents:  herceptin  To help prevent nausea and vomiting after your treatment, we encourage you to take your nausea medication.  If you develop nausea and vomiting that is not controlled by your nausea medication, call the clinic.   BELOW ARE SYMPTOMS THAT SHOULD BE REPORTED IMMEDIATELY:  *FEVER GREATER THAN 100.5 F  *CHILLS WITH OR WITHOUT FEVER  NAUSEA AND VOMITING THAT IS NOT CONTROLLED WITH YOUR NAUSEA MEDICATION  *UNUSUAL SHORTNESS OF BREATH  *UNUSUAL BRUISING OR BLEEDING  TENDERNESS IN MOUTH AND THROAT WITH OR WITHOUT PRESENCE OF ULCERS  *URINARY PROBLEMS  *BOWEL PROBLEMS  UNUSUAL RASH Items with * indicate a potential emergency and should be followed up as soon as possible.  Feel free to call the clinic you have any questions or concerns. The clinic phone number is [phone].    

## 2012-10-26 ENCOUNTER — Encounter: Payer: Self-pay | Admitting: Family

## 2012-10-26 ENCOUNTER — Encounter: Payer: Self-pay | Admitting: Physician Assistant

## 2012-10-26 ENCOUNTER — Ambulatory Visit (INDEPENDENT_AMBULATORY_CARE_PROVIDER_SITE_OTHER): Payer: BC Managed Care – PPO | Admitting: Family

## 2012-10-26 VITALS — BP 124/84 | HR 76 | Ht 67.0 in | Wt 124.0 lb

## 2012-10-26 DIAGNOSIS — I10 Essential (primary) hypertension: Secondary | ICD-10-CM

## 2012-10-26 DIAGNOSIS — E7800 Pure hypercholesterolemia, unspecified: Secondary | ICD-10-CM

## 2012-10-26 NOTE — Patient Instructions (Addendum)
Gastroesophageal Reflux Disease, Adult  Gastroesophageal reflux disease (GERD) happens when acid from your stomach flows up into the esophagus. When acid comes in contact with the esophagus, the acid causes soreness (inflammation) in the esophagus. Over time, GERD may create small holes (ulcers) in the lining of the esophagus.  CAUSES   · Increased body weight. This puts pressure on the stomach, making acid rise from the stomach into the esophagus.  · Smoking. This increases acid production in the stomach.  · Drinking alcohol. This causes decreased pressure in the lower esophageal sphincter (valve or ring of muscle between the esophagus and stomach), allowing acid from the stomach into the esophagus.  · Late evening meals and a full stomach. This increases pressure and acid production in the stomach.  · A malformed lower esophageal sphincter.  Sometimes, no cause is found.  SYMPTOMS   · Burning pain in the lower part of the mid-chest behind the breastbone and in the mid-stomach area. This may occur twice a week or more often.  · Trouble swallowing.  · Sore throat.  · Dry cough.  · Asthma-like symptoms including chest tightness, shortness of breath, or wheezing.  DIAGNOSIS   Your caregiver may be able to diagnose GERD based on your symptoms. In some cases, X-rays and other tests may be done to check for complications or to check the condition of your stomach and esophagus.  TREATMENT   Your caregiver may recommend over-the-counter or prescription medicines to help decrease acid production. Ask your caregiver before starting or adding any new medicines.   HOME CARE INSTRUCTIONS   · Change the factors that you can control. Ask your caregiver for guidance concerning weight loss, quitting smoking, and alcohol consumption.  · Avoid foods and drinks that make your symptoms worse, such as:  · Caffeine or alcoholic drinks.  · Chocolate.  · Peppermint or mint flavorings.  · Garlic and onions.  · Spicy foods.  · Citrus fruits,  such as oranges, lemons, or limes.  · Tomato-based foods such as sauce, chili, salsa, and pizza.  · Fried and fatty foods.  · Avoid lying down for the 3 hours prior to your bedtime or prior to taking a nap.  · Eat small, frequent meals instead of large meals.  · Wear loose-fitting clothing. Do not wear anything tight around your waist that causes pressure on your stomach.  · Raise the head of your bed 6 to 8 inches with wood blocks to help you sleep. Extra pillows will not help.  · Only take over-the-counter or prescription medicines for pain, discomfort, or fever as directed by your caregiver.  · Do not take aspirin, ibuprofen, or other nonsteroidal anti-inflammatory drugs (NSAIDs).  SEEK IMMEDIATE MEDICAL CARE IF:   · You have pain in your arms, neck, jaw, teeth, or back.  · Your pain increases or changes in intensity or duration.  · You develop nausea, vomiting, or sweating (diaphoresis).  · You develop shortness of breath, or you faint.  · Your vomit is green, yellow, black, or looks like coffee grounds or blood.  · Your stool is red, bloody, or black.  These symptoms could be signs of other problems, such as heart disease, gastric bleeding, or esophageal bleeding.  MAKE SURE YOU:   · Understand these instructions.  · Will watch your condition.  · Will get help right away if you are not doing well or get worse.  Document Released: 01/26/2005 Document Revised: 07/11/2011 Document Reviewed: 11/05/2010  Exit[employer]® Patient   Information ©2014 [employer].
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
## 2012-11-08 ENCOUNTER — Encounter: Payer: Self-pay | Admitting: Physician Assistant

## 2012-11-08 ENCOUNTER — Other Ambulatory Visit (HOSPITAL_BASED_OUTPATIENT_CLINIC_OR_DEPARTMENT_OTHER): Payer: BC Managed Care – PPO | Admitting: Lab

## 2012-11-08 ENCOUNTER — Other Ambulatory Visit: Payer: Self-pay | Admitting: Oncology

## 2012-11-08 ENCOUNTER — Ambulatory Visit (HOSPITAL_BASED_OUTPATIENT_CLINIC_OR_DEPARTMENT_OTHER): Payer: BC Managed Care – PPO

## 2012-11-08 DIAGNOSIS — C50119 Malignant neoplasm of central portion of unspecified female breast: Secondary | ICD-10-CM

## 2012-11-08 DIAGNOSIS — Z5112 Encounter for antineoplastic immunotherapy: Secondary | ICD-10-CM

## 2012-11-08 DIAGNOSIS — C50911 Malignant neoplasm of unspecified site of right female breast: Secondary | ICD-10-CM

## 2012-11-08 LAB — CBC WITH DIFFERENTIAL/PLATELET
BASO%: 0.4 % (ref 0.0–2.0)
Basophils Absolute: 0 10*3/uL (ref 0.0–0.1)
EOS%: 0.4 % (ref 0.0–7.0)
HCT: 39.8 % (ref 34.8–46.6)
HGB: 13.6 g/dL (ref 11.6–15.9)
LYMPH%: 27.7 % (ref 14.0–49.7)
MCH: 30.6 pg (ref 25.1–34.0)
MCHC: 34.2 g/dL (ref 31.5–36.0)
MCV: 89.4 fL (ref 79.5–101.0)
NEUT%: 64.8 % (ref 38.4–76.8)
Platelets: 228 10*3/uL (ref 145–400)

## 2012-11-08 LAB — COMPREHENSIVE METABOLIC PANEL (CC13)
AST: 18 U/L (ref 5–34)
Albumin: 3.5 g/dL (ref 3.5–5.0)
Alkaline Phosphatase: 61 U/L (ref 40–150)
BUN: 14.8 mg/dL (ref 7.0–26.0)
Calcium: 8.8 mg/dL (ref 8.4–10.4)
Creatinine: 0.7 mg/dL (ref 0.6–1.1)
Glucose: 120 mg/dl (ref 70–140)

## 2012-11-08 MED ORDER — HEPARIN SOD (PORK) LOCK FLUSH 100 UNIT/ML IV SOLN
500.0000 [IU] | Freq: Once | INTRAVENOUS | Status: AC | PRN
  Administered 2012-11-08: 500 [IU]
  Filled 2012-11-08: qty 5

## 2012-11-08 MED ORDER — TRASTUZUMAB CHEMO INJECTION 440 MG
6.0000 mg/kg | Freq: Once | INTRAVENOUS | Status: AC
  Administered 2012-11-08: 357 mg via INTRAVENOUS
  Filled 2012-11-08: qty 17

## 2012-11-08 MED ORDER — SODIUM CHLORIDE 0.9 % IJ SOLN
10.0000 mL | INTRAMUSCULAR | Status: DC | PRN
  Administered 2012-11-08: 10 mL
  Filled 2012-11-08: qty 10

## 2012-11-08 MED ORDER — DIPHENHYDRAMINE HCL 25 MG PO CAPS
50.0000 mg | ORAL_CAPSULE | Freq: Once | ORAL | Status: AC
  Administered 2012-11-08: 50 mg via ORAL

## 2012-11-08 MED ORDER — SODIUM CHLORIDE 0.9 % IV SOLN
Freq: Once | INTRAVENOUS | Status: AC
  Administered 2012-11-08: 15:00:00 via INTRAVENOUS

## 2012-11-08 MED ORDER — ACETAMINOPHEN 325 MG PO TABS
650.0000 mg | ORAL_TABLET | Freq: Once | ORAL | Status: AC
  Administered 2012-11-08: 650 mg via ORAL

## 2012-11-08 NOTE — Patient Instructions (Signed)
[facility] Discharge Instructions for Patients Receiving Chemotherapy  Today you received the following chemotherapy agents [name].  To help prevent nausea and vomiting after your treatment, we encourage you to take your nausea medication as prescribed.   If you develop nausea and vomiting that is not controlled by your nausea medication, call the clinic.   BELOW ARE SYMPTOMS THAT SHOULD BE REPORTED IMMEDIATELY:  *FEVER GREATER THAN 100.5 F  *CHILLS WITH OR WITHOUT FEVER  NAUSEA AND VOMITING THAT IS NOT CONTROLLED WITH YOUR NAUSEA MEDICATION  *UNUSUAL SHORTNESS OF BREATH  *UNUSUAL BRUISING OR BLEEDING  TENDERNESS IN MOUTH AND THROAT WITH OR WITHOUT PRESENCE OF ULCERS  *URINARY PROBLEMS  *BOWEL PROBLEMS  UNUSUAL RASH Items with * indicate a potential emergency and should be followed up as soon as possible.  Feel free to call the clinic you have any questions or concerns. The clinic phone number is [phone].    

## 2012-11-09 ENCOUNTER — Other Ambulatory Visit: Payer: BC Managed Care – PPO | Admitting: Lab

## 2012-11-09 ENCOUNTER — Ambulatory Visit: Payer: BC Managed Care – PPO

## 2012-11-29 ENCOUNTER — Other Ambulatory Visit (HOSPITAL_BASED_OUTPATIENT_CLINIC_OR_DEPARTMENT_OTHER): Payer: BC Managed Care – PPO | Admitting: Lab

## 2012-11-29 ENCOUNTER — Ambulatory Visit (HOSPITAL_BASED_OUTPATIENT_CLINIC_OR_DEPARTMENT_OTHER): Payer: BC Managed Care – PPO | Admitting: Oncology

## 2012-11-29 ENCOUNTER — Ambulatory Visit (HOSPITAL_BASED_OUTPATIENT_CLINIC_OR_DEPARTMENT_OTHER): Payer: BC Managed Care – PPO

## 2012-11-29 ENCOUNTER — Other Ambulatory Visit: Payer: BC Managed Care – PPO | Admitting: Lab

## 2012-11-29 VITALS — BP 131/90 | HR 99 | Temp 99.2°F | Resp 20 | Ht 67.0 in | Wt 121.4 lb

## 2012-11-29 DIAGNOSIS — Z9010 Acquired absence of unspecified breast and nipple: Secondary | ICD-10-CM

## 2012-11-29 DIAGNOSIS — C50911 Malignant neoplasm of unspecified site of right female breast: Secondary | ICD-10-CM

## 2012-11-29 DIAGNOSIS — Z5112 Encounter for antineoplastic immunotherapy: Secondary | ICD-10-CM

## 2012-11-29 DIAGNOSIS — C50119 Malignant neoplasm of central portion of unspecified female breast: Secondary | ICD-10-CM

## 2012-11-29 DIAGNOSIS — Z170 Estrogen receptor positive status [ER+]: Secondary | ICD-10-CM

## 2012-11-29 DIAGNOSIS — F411 Generalized anxiety disorder: Secondary | ICD-10-CM

## 2012-11-29 LAB — CBC WITH DIFFERENTIAL/PLATELET
Basophils Absolute: 0 10*3/uL (ref 0.0–0.1)
EOS%: 0.3 % (ref 0.0–7.0)
Eosinophils Absolute: 0 10*3/uL (ref 0.0–0.5)
HCT: 41.5 % (ref 34.8–46.6)
HGB: 14 g/dL (ref 11.6–15.9)
MCH: 30 pg (ref 25.1–34.0)
MCV: 89.1 fL (ref 79.5–101.0)
NEUT#: 4 10*3/uL (ref 1.5–6.5)
NEUT%: 64.9 % (ref 38.4–76.8)
RDW: 14.1 % (ref 11.2–14.5)
lymph#: 1.8 10*3/uL (ref 0.9–3.3)

## 2012-11-29 MED ORDER — TRASTUZUMAB CHEMO INJECTION 440 MG
6.0000 mg/kg | Freq: Once | INTRAVENOUS | Status: AC
  Administered 2012-11-29: 357 mg via INTRAVENOUS
  Filled 2012-11-29: qty 17

## 2012-11-29 MED ORDER — SODIUM CHLORIDE 0.9 % IV SOLN
Freq: Once | INTRAVENOUS | Status: AC
  Administered 2012-11-29: 13:00:00 via INTRAVENOUS

## 2012-11-29 MED ORDER — VENLAFAXINE HCL ER 75 MG PO CP24: 75.0000 mg | ORAL_CAPSULE | Freq: Every day | ORAL | Status: DC

## 2012-11-29 MED ORDER — HEPARIN SOD (PORK) LOCK FLUSH 100 UNIT/ML IV SOLN
500.0000 [IU] | Freq: Once | INTRAVENOUS | Status: AC | PRN
  Administered 2012-11-29: 500 [IU]
  Filled 2012-11-29: qty 5

## 2012-11-29 MED ORDER — SODIUM CHLORIDE 0.9 % IJ SOLN
10.0000 mL | INTRAMUSCULAR | Status: DC | PRN
  Administered 2012-11-29: 10 mL
  Filled 2012-11-29: qty 10

## 2012-11-29 MED ORDER — DIPHENHYDRAMINE HCL 25 MG PO CAPS
50.0000 mg | ORAL_CAPSULE | Freq: Once | ORAL | Status: AC
  Administered 2012-11-29: 50 mg via ORAL

## 2012-11-29 MED ORDER — ACETAMINOPHEN 325 MG PO TABS
650.0000 mg | ORAL_TABLET | Freq: Once | ORAL | Status: AC
  Administered 2012-11-29: 650 mg via ORAL

## 2012-11-29 NOTE — Patient Instructions (Addendum)
[facility] Discharge Instructions for Patients Receiving Chemotherapy  Today you received [name].   If you develop any of the following, please call the clinic.   BELOW ARE SYMPTOMS THAT SHOULD BE REPORTED IMMEDIATELY:  *FEVER GREATER THAN 100.5 F  *CHILLS WITH OR WITHOUT FEVER  NAUSEA AND VOMITING THAT IS NOT CONTROLLED WITH YOUR NAUSEA MEDICATION  *UNUSUAL SHORTNESS OF BREATH  *UNUSUAL BRUISING OR BLEEDING  TENDERNESS IN MOUTH AND THROAT WITH OR WITHOUT PRESENCE OF ULCERS  *URINARY PROBLEMS  *BOWEL PROBLEMS  UNUSUAL RASH Items with * indicate a potential emergency and should be followed up as soon as possible.  Feel free to call the clinic you have any questions or concerns. The clinic phone number is [phone].
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
## 2012-11-30 ENCOUNTER — Telehealth: Payer: Self-pay | Admitting: *Deleted

## 2012-11-30 ENCOUNTER — Other Ambulatory Visit: Payer: BC Managed Care – PPO | Admitting: Lab

## 2012-11-30 ENCOUNTER — Ambulatory Visit: Payer: BC Managed Care – PPO

## 2012-11-30 NOTE — Telephone Encounter (Signed)
Lm gv appt for 01/10/13 to have labs @ 9:15am and ov @ 9:45am will tx to follow. i emailed MW to add tx's. Pt is aware that i will place a letter/avs in the mail...td

## 2012-12-04 ENCOUNTER — Ambulatory Visit: Payer: BC Managed Care – PPO | Admitting: Oncology

## 2012-12-04 ENCOUNTER — Other Ambulatory Visit: Payer: BC Managed Care – PPO | Admitting: Lab

## 2012-12-20 ENCOUNTER — Other Ambulatory Visit (HOSPITAL_BASED_OUTPATIENT_CLINIC_OR_DEPARTMENT_OTHER): Payer: BC Managed Care – PPO | Admitting: Lab

## 2012-12-20 ENCOUNTER — Ambulatory Visit (HOSPITAL_BASED_OUTPATIENT_CLINIC_OR_DEPARTMENT_OTHER): Payer: BC Managed Care – PPO

## 2012-12-20 VITALS — BP 140/91 | HR 71 | Temp 98.4°F | Resp 18

## 2012-12-20 DIAGNOSIS — C50119 Malignant neoplasm of central portion of unspecified female breast: Secondary | ICD-10-CM

## 2012-12-20 DIAGNOSIS — C50911 Malignant neoplasm of unspecified site of right female breast: Secondary | ICD-10-CM

## 2012-12-20 DIAGNOSIS — Z5112 Encounter for antineoplastic immunotherapy: Secondary | ICD-10-CM

## 2012-12-20 LAB — CBC WITH DIFFERENTIAL/PLATELET
Basophils Absolute: 0 10*3/uL (ref 0.0–0.1)
EOS%: 0.2 % (ref 0.0–7.0)
Eosinophils Absolute: 0 10*3/uL (ref 0.0–0.5)
HCT: 40.2 % (ref 34.8–46.6)
HGB: 13.7 g/dL (ref 11.6–15.9)
LYMPH%: 28.5 % (ref 14.0–49.7)
MCH: 30.5 pg (ref 25.1–34.0)
MCV: 89.5 fL (ref 79.5–101.0)
MONO%: 6.3 % (ref 0.0–14.0)
NEUT#: 3.2 10*3/uL (ref 1.5–6.5)
NEUT%: 64.4 % (ref 38.4–76.8)
Platelets: 250 10*3/uL (ref 145–400)
RDW: 14.5 % (ref 11.2–14.5)

## 2012-12-20 MED ORDER — HEPARIN SOD (PORK) LOCK FLUSH 100 UNIT/ML IV SOLN
500.0000 [IU] | Freq: Once | INTRAVENOUS | Status: AC | PRN
  Administered 2012-12-20: 500 [IU]
  Filled 2012-12-20: qty 5

## 2012-12-20 MED ORDER — ACETAMINOPHEN 325 MG PO TABS
650.0000 mg | ORAL_TABLET | Freq: Once | ORAL | Status: AC
  Administered 2012-12-20: 650 mg via ORAL

## 2012-12-20 MED ORDER — DIPHENHYDRAMINE HCL 25 MG PO CAPS
50.0000 mg | ORAL_CAPSULE | Freq: Once | ORAL | Status: AC
  Administered 2012-12-20: 50 mg via ORAL

## 2012-12-20 MED ORDER — TRASTUZUMAB CHEMO INJECTION 440 MG
6.0000 mg/kg | Freq: Once | INTRAVENOUS | Status: AC
  Administered 2012-12-20: 357 mg via INTRAVENOUS
  Filled 2012-12-20: qty 17

## 2012-12-20 MED ORDER — SODIUM CHLORIDE 0.9 % IV SOLN
Freq: Once | INTRAVENOUS | Status: AC
  Administered 2012-12-20: 15:00:00 via INTRAVENOUS

## 2012-12-20 MED ORDER — SODIUM CHLORIDE 0.9 % IJ SOLN
10.0000 mL | INTRAMUSCULAR | Status: DC | PRN
  Administered 2012-12-20: 10 mL
  Filled 2012-12-20: qty 10

## 2012-12-20 NOTE — Patient Instructions (Addendum)
[facility] Discharge Instructions for Patients Receiving Chemotherapy  Today you received the following chemotherapy agents [name]  To help prevent nausea and vomiting after your treatment, we encourage you to take your nausea medication     If you develop nausea and vomiting that is not controlled by your nausea medication, call the clinic.   BELOW ARE SYMPTOMS THAT SHOULD BE REPORTED IMMEDIATELY:  *FEVER GREATER THAN 100.5 F  *CHILLS WITH OR WITHOUT FEVER  NAUSEA AND VOMITING THAT IS NOT CONTROLLED WITH YOUR NAUSEA MEDICATION  *UNUSUAL SHORTNESS OF BREATH  *UNUSUAL BRUISING OR BLEEDING  TENDERNESS IN MOUTH AND THROAT WITH OR WITHOUT PRESENCE OF ULCERS  *URINARY PROBLEMS  *BOWEL PROBLEMS  UNUSUAL RASH Items with * indicate a potential emergency and should be followed up as soon as possible.  Feel free to call the clinic you have any questions or concerns. The clinic phone number is [phone].    

## 2012-12-21 ENCOUNTER — Other Ambulatory Visit: Payer: BC Managed Care – PPO | Admitting: Lab

## 2012-12-24 ENCOUNTER — Ambulatory Visit (HOSPITAL_BASED_OUTPATIENT_CLINIC_OR_DEPARTMENT_OTHER)
Admission: RE | Admit: 2012-12-24 | Discharge: 2012-12-24 | Disposition: A | Discharge status: Home / Self Care | Payer: BC Managed Care – PPO | Source: Ambulatory Visit | Attending: Internal Medicine | Admitting: Internal Medicine

## 2012-12-24 ENCOUNTER — Ambulatory Visit (HOSPITAL_COMMUNITY)
Admission: RE | Admit: 2012-12-24 | Discharge: 2012-12-24 | Disposition: A | Discharge status: Home / Self Care | Payer: BC Managed Care – PPO | Source: Ambulatory Visit | Attending: Internal Medicine | Admitting: Internal Medicine

## 2012-12-24 VITALS — BP 126/88 | HR 83 | Wt 124.0 lb

## 2012-12-24 DIAGNOSIS — C50911 Malignant neoplasm of unspecified site of right female breast: Secondary | ICD-10-CM

## 2012-12-24 DIAGNOSIS — C50919 Malignant neoplasm of unspecified site of unspecified female breast: Secondary | ICD-10-CM | POA: Insufficient documentation

## 2012-12-24 DIAGNOSIS — Z09 Encounter for follow-up examination after completed treatment for conditions other than malignant neoplasm: Secondary | ICD-10-CM

## 2012-12-24 DIAGNOSIS — I059 Rheumatic mitral valve disease, unspecified: Secondary | ICD-10-CM | POA: Insufficient documentation
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
## 2012-12-24 NOTE — Patient Instructions (Addendum)
Follow up in 3 months with an ECHO and Dr Bensimhon 

## 2013-01-10 ENCOUNTER — Other Ambulatory Visit: Payer: Self-pay | Admitting: Physician Assistant

## 2013-01-10 ENCOUNTER — Telehealth: Payer: Self-pay | Admitting: Oncology

## 2013-01-10 ENCOUNTER — Other Ambulatory Visit: Payer: BC Managed Care – PPO | Admitting: Lab

## 2013-01-10 ENCOUNTER — Encounter: Payer: Self-pay | Admitting: Physician Assistant

## 2013-01-10 ENCOUNTER — Encounter: Payer: Self-pay | Admitting: Oncology

## 2013-01-10 ENCOUNTER — Telehealth: Payer: Self-pay | Admitting: *Deleted

## 2013-01-10 ENCOUNTER — Ambulatory Visit (HOSPITAL_BASED_OUTPATIENT_CLINIC_OR_DEPARTMENT_OTHER): Payer: BC Managed Care – PPO | Admitting: Physician Assistant

## 2013-01-10 ENCOUNTER — Other Ambulatory Visit (HOSPITAL_BASED_OUTPATIENT_CLINIC_OR_DEPARTMENT_OTHER): Payer: BC Managed Care – PPO | Admitting: Lab

## 2013-01-10 ENCOUNTER — Ambulatory Visit (HOSPITAL_BASED_OUTPATIENT_CLINIC_OR_DEPARTMENT_OTHER): Payer: BC Managed Care – PPO

## 2013-01-10 VITALS — BP 151/93 | HR 76 | Temp 98.6°F | Resp 19 | Ht 67.0 in | Wt 126.7 lb

## 2013-01-10 DIAGNOSIS — Z170 Estrogen receptor positive status [ER+]: Secondary | ICD-10-CM

## 2013-01-10 DIAGNOSIS — F411 Generalized anxiety disorder: Secondary | ICD-10-CM

## 2013-01-10 DIAGNOSIS — F329 Major depressive disorder, single episode, unspecified: Secondary | ICD-10-CM

## 2013-01-10 DIAGNOSIS — C50911 Malignant neoplasm of unspecified site of right female breast: Secondary | ICD-10-CM

## 2013-01-10 DIAGNOSIS — C50119 Malignant neoplasm of central portion of unspecified female breast: Secondary | ICD-10-CM

## 2013-01-10 DIAGNOSIS — F419 Anxiety disorder, unspecified: Secondary | ICD-10-CM

## 2013-01-10 DIAGNOSIS — I10 Essential (primary) hypertension: Secondary | ICD-10-CM

## 2013-01-10 DIAGNOSIS — F32A Depression, unspecified: Secondary | ICD-10-CM

## 2013-01-10 DIAGNOSIS — Z853 Personal history of malignant neoplasm of breast: Secondary | ICD-10-CM

## 2013-01-10 DIAGNOSIS — Z5112 Encounter for antineoplastic immunotherapy: Secondary | ICD-10-CM

## 2013-01-10 LAB — CBC WITH DIFFERENTIAL/PLATELET
Eosinophils Absolute: 0 10*3/uL (ref 0.0–0.5)
HCT: 39.2 % (ref 34.8–46.6)
LYMPH%: 23.7 % (ref 14.0–49.7)
MCHC: 33.7 g/dL (ref 31.5–36.0)
MCV: 91.8 fL (ref 79.5–101.0)
MONO%: 5.3 % (ref 0.0–14.0)
NEUT#: 3.7 10*3/uL (ref 1.5–6.5)
NEUT%: 70 % (ref 38.4–76.8)
Platelets: 201 10*3/uL (ref 145–400)
RBC: 4.27 10*6/uL (ref 3.70–5.45)
nRBC: 0 % (ref 0–0)

## 2013-01-10 MED ORDER — DIPHENHYDRAMINE HCL 25 MG PO CAPS
ORAL_CAPSULE | ORAL | Status: AC
  Administered 2013-01-10: 50 mg via ORAL
  Filled 2013-01-10: qty 2

## 2013-01-10 MED ORDER — SODIUM CHLORIDE 0.9 % IV SOLN
Freq: Once | INTRAVENOUS | Status: AC
  Administered 2013-01-10: 11:00:00 via INTRAVENOUS

## 2013-01-10 MED ORDER — DIPHENHYDRAMINE HCL 25 MG PO CAPS
50.0000 mg | ORAL_CAPSULE | Freq: Once | ORAL | Status: AC
  Administered 2013-01-10: 50 mg via ORAL

## 2013-01-10 MED ORDER — ACETAMINOPHEN 325 MG PO TABS
ORAL_TABLET | ORAL | Status: AC
  Administered 2013-01-10: 650 mg via ORAL
  Filled 2013-01-10: qty 2

## 2013-01-10 MED ORDER — ACETAMINOPHEN 325 MG PO TABS
650.0000 mg | ORAL_TABLET | Freq: Once | ORAL | Status: AC
  Administered 2013-01-10: 650 mg via ORAL

## 2013-01-10 MED ORDER — HEPARIN SOD (PORK) LOCK FLUSH 100 UNIT/ML IV SOLN
500.0000 [IU] | Freq: Once | INTRAVENOUS | Status: AC | PRN
  Administered 2013-01-10: 500 [IU]
  Filled 2013-01-10: qty 5

## 2013-01-10 MED ORDER — SODIUM CHLORIDE 0.9 % IJ SOLN
10.0000 mL | INTRAMUSCULAR | Status: DC | PRN
  Administered 2013-01-10: 10 mL
  Filled 2013-01-10: qty 10

## 2013-01-10 MED ORDER — TRASTUZUMAB CHEMO INJECTION 440 MG
6.0000 mg/kg | Freq: Once | INTRAVENOUS | Status: AC
  Administered 2013-01-10: 357 mg via INTRAVENOUS
  Filled 2013-01-10: qty 17

## 2013-01-10 NOTE — Telephone Encounter (Signed)
, °

## 2013-01-10 NOTE — Telephone Encounter (Signed)
Per staff message I have adjusted appt for 10/23  jmw

## 2013-01-10 NOTE — Patient Instructions (Addendum)
[facility] Discharge Instructions for Patients Receiving Chemotherapy  Today you received the following chemotherapy agents [name].  To help prevent nausea and vomiting after your treatment, we encourage you to take your nausea medication as prescribed.   If you develop nausea and vomiting that is not controlled by your nausea medication, call the clinic.   BELOW ARE SYMPTOMS THAT SHOULD BE REPORTED IMMEDIATELY:  *FEVER GREATER THAN 100.5 F  *CHILLS WITH OR WITHOUT FEVER  NAUSEA AND VOMITING THAT IS NOT CONTROLLED WITH YOUR NAUSEA MEDICATION  *UNUSUAL SHORTNESS OF BREATH  *UNUSUAL BRUISING OR BLEEDING  TENDERNESS IN MOUTH AND THROAT WITH OR WITHOUT PRESENCE OF ULCERS  *URINARY PROBLEMS  *BOWEL PROBLEMS  UNUSUAL RASH Items with * indicate a potential emergency and should be followed up as soon as possible.  Feel free to call the clinic you have any questions or concerns. The clinic phone number is [phone].    
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
## 2013-01-11 ENCOUNTER — Other Ambulatory Visit: Payer: BC Managed Care – PPO | Admitting: Lab

## 2013-01-15 ENCOUNTER — Telehealth: Payer: Self-pay | Admitting: Family

## 2013-01-15 NOTE — Telephone Encounter (Signed)
Patient Information:  Caller Name: [place]  Phone: [phone]  Patient: Christina Daniel, Christina Daniel  Gender: Female  DOB: 1950-11-04  Age: 62 Years  PCP: Adline Mango [facility])  Office Follow Up:  Does the office need to follow up with this patient?: No  Instructions For The Office: N/A   Symptoms  Reason For Call & Symptoms: Pt calling that her BP earlier was 140/106 and pulse 99.  Sx started 01/10/13.  Then she retook it today and it is much lower.  Triaged and offered pt an appt today and she states she wants  to monitor it today, 01/15/13 and if it is high tomorrow, 01/16/13 she will call back for ran appt.  Reviewed Health History In EMR: Yes  Reviewed Medications In EMR: Yes  Reviewed Allergies In EMR: Yes  Reviewed Surgeries / Procedures: Yes  Date of Onset of Symptoms: 01/10/2013  Guideline(s) Used:  High Blood Pressure  Disposition Per Guideline:   See Within 2 Weeks in Office  Reason For Disposition Reached:   BP > 140/90 and is taking BP medications  Advice Given:  Call Back If:  Headache, blurred vision, difficulty talking, or difficulty walking occurs  Chest pain or difficulty breathing occurs  You want to go in to the office for a blood pressure check  You become worse.  Patient Refused Recommendation:  Patient Will Make Own Appointment  Pt wants to moniitor her B/P 01/15/13 and see what it does.

## 2013-01-17 ENCOUNTER — Encounter: Payer: Self-pay | Admitting: Family Medicine

## 2013-01-17 ENCOUNTER — Encounter: Payer: Self-pay | Admitting: Oncology

## 2013-01-17 ENCOUNTER — Ambulatory Visit (INDEPENDENT_AMBULATORY_CARE_PROVIDER_SITE_OTHER): Payer: BC Managed Care – PPO | Admitting: Family Medicine

## 2013-01-17 VITALS — BP 136/92 | Temp 98.6°F | Wt 125.0 lb

## 2013-01-17 DIAGNOSIS — I10 Essential (primary) hypertension: Secondary | ICD-10-CM

## 2013-01-17 MED ORDER — LISINOPRIL 10 MG PO TABS: 10.0000 mg | ORAL_TABLET | Freq: Every day | ORAL | Status: DC
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
## 2013-01-24 ENCOUNTER — Encounter: Payer: Self-pay | Admitting: Oncology

## 2013-01-25 ENCOUNTER — Encounter: Payer: Self-pay | Admitting: *Deleted

## 2013-01-25 NOTE — Progress Notes (Signed)
PT. REQUESTED A 90 SUPPLY OF VENLAFAXINE XR 75MG  VIA MY CHART TO BE SENT TO [facility]. THIS WAS DONE AS PT. REQUESTED BUT NO ADDITIONAL REFILLS. LEFT PT. A MESSAGE ON HER HOME VOICE MAIL.

## 2013-01-26 ENCOUNTER — Encounter: Payer: Self-pay | Admitting: Family

## 2013-01-29 ENCOUNTER — Ambulatory Visit (INDEPENDENT_AMBULATORY_CARE_PROVIDER_SITE_OTHER): Payer: BC Managed Care – PPO | Admitting: Family

## 2013-01-29 ENCOUNTER — Encounter: Payer: Self-pay | Admitting: Family

## 2013-01-29 VITALS — BP 130/98 | HR 63 | Ht 67.0 in | Wt 126.0 lb

## 2013-01-29 DIAGNOSIS — Z0000 Encounter for general adult medical examination without abnormal findings: Secondary | ICD-10-CM

## 2013-01-29 DIAGNOSIS — I10 Essential (primary) hypertension: Secondary | ICD-10-CM

## 2013-01-29 DIAGNOSIS — E7800 Pure hypercholesterolemia, unspecified: Secondary | ICD-10-CM

## 2013-01-29 DIAGNOSIS — Z23 Encounter for immunization: Secondary | ICD-10-CM

## 2013-01-29 LAB — CBC WITH DIFFERENTIAL/PLATELET
Basophils Absolute: 0 10*3/uL (ref 0.0–0.1)
Basophils Relative: 0.4 % (ref 0.0–3.0)
Eosinophils Relative: 0.5 % (ref 0.0–5.0)
Hemoglobin: 13.7 g/dL (ref 12.0–15.0)
Lymphocytes Relative: 20.2 % (ref 12.0–46.0)
Monocytes Absolute: 0.3 10*3/uL (ref 0.1–1.0)
Monocytes Relative: 5.8 % (ref 3.0–12.0)
Neutro Abs: 3.7 10*3/uL (ref 1.4–7.7)
Platelets: 252 10*3/uL (ref 150.0–400.0)
RBC: 4.4 Mil/uL (ref 3.87–5.11)
WBC: 5.1 10*3/uL (ref 4.5–10.5)

## 2013-01-29 LAB — TSH: TSH: 1.3 u[IU]/mL (ref 0.35–5.50)

## 2013-01-29 LAB — BASIC METABOLIC PANEL
CO2: 29 mEq/L (ref 19–32)
Calcium: 8.8 mg/dL (ref 8.4–10.5)
Chloride: 106 mEq/L (ref 96–112)
GFR: 101.08 mL/min (ref >60.00)
Potassium: 4.3 mEq/L (ref 3.5–5.1)
Sodium: 141 mEq/L (ref 135–145)

## 2013-01-29 LAB — HEPATIC FUNCTION PANEL
ALT: 25 U/L (ref 0–35)
AST: 23 U/L (ref 0–37)
Albumin: 3.8 g/dL (ref 3.5–5.2)
Alkaline Phosphatase: 44 U/L (ref 39–117)
Total Protein: 6.7 g/dL (ref 6.0–8.3)

## 2013-01-29 MED ORDER — LOSARTAN POTASSIUM 50 MG PO TABS: 50.0000 mg | ORAL_TABLET | Freq: Every day | ORAL | Status: DC

## 2013-01-29 NOTE — Patient Instructions (Addendum)
Exercise to Stay Healthy Exercise helps you become and stay healthy. EXERCISE IDEAS AND TIPS Choose exercises that:  You enjoy.  Fit into your day. You do not need to exercise really hard to be healthy. You can do exercises at a slow or medium level and stay healthy. You can:  Stretch before and after working out.  Try yoga, Pilates, or tai chi.  Lift weights.  Walk fast, swim, jog, run, climb stairs, bicycle, dance, or rollerskate.  Take aerobic classes. Exercises that burn about 150 calories:  Running 1  miles in 15 minutes.  Playing volleyball for 45 to 60 minutes.  Washing and waxing a car for 45 to 60 minutes.  Playing touch football for 45 minutes.  Walking 1  miles in 35 minutes.  Pushing a stroller 1  miles in 30 minutes.  Playing basketball for 30 minutes.  Raking leaves for 30 minutes.  Bicycling 5 miles in 30 minutes.  Walking 2 miles in 30 minutes.  Dancing for 30 minutes.  Shoveling snow for 15 minutes.  Swimming laps for 20 minutes.  Walking up stairs for 15 minutes.  Bicycling 4 miles in 15 minutes.  Gardening for 30 to 45 minutes.  Jumping rope for 15 minutes.  Washing windows or floors for 45 to 60 minutes. Document Released: 05/21/2010 Document Revised: 07/11/2011 Document Reviewed: 05/21/2010 [employer] Patient Information 2014 [employer].  
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
## 2013-01-31 ENCOUNTER — Encounter: Payer: Self-pay | Admitting: Oncology

## 2013-01-31 ENCOUNTER — Other Ambulatory Visit: Payer: BC Managed Care – PPO

## 2013-01-31 ENCOUNTER — Ambulatory Visit (HOSPITAL_BASED_OUTPATIENT_CLINIC_OR_DEPARTMENT_OTHER): Payer: BC Managed Care – PPO

## 2013-01-31 ENCOUNTER — Encounter: Payer: Self-pay | Admitting: Family

## 2013-01-31 VITALS — BP 120/82 | HR 83 | Temp 98.2°F | Resp 20

## 2013-01-31 DIAGNOSIS — Z5112 Encounter for antineoplastic immunotherapy: Secondary | ICD-10-CM

## 2013-01-31 DIAGNOSIS — C50911 Malignant neoplasm of unspecified site of right female breast: Secondary | ICD-10-CM

## 2013-01-31 DIAGNOSIS — C50119 Malignant neoplasm of central portion of unspecified female breast: Secondary | ICD-10-CM

## 2013-01-31 MED ORDER — SODIUM CHLORIDE 0.9 % IV SOLN
Freq: Once | INTRAVENOUS | Status: AC
  Administered 2013-01-31: 14:00:00 via INTRAVENOUS

## 2013-01-31 MED ORDER — TRASTUZUMAB CHEMO INJECTION 440 MG
6.0000 mg/kg | Freq: Once | INTRAVENOUS | Status: AC
  Administered 2013-01-31: 357 mg via INTRAVENOUS
  Filled 2013-01-31: qty 17

## 2013-01-31 MED ORDER — DIPHENHYDRAMINE HCL 25 MG PO CAPS
ORAL_CAPSULE | ORAL | Status: AC
  Filled 2013-01-31: qty 2

## 2013-01-31 MED ORDER — DIPHENHYDRAMINE HCL 25 MG PO CAPS
50.0000 mg | ORAL_CAPSULE | Freq: Once | ORAL | Status: AC
  Administered 2013-01-31: 50 mg via ORAL

## 2013-01-31 MED ORDER — SODIUM CHLORIDE 0.9 % IJ SOLN
10.0000 mL | INTRAMUSCULAR | Status: DC | PRN
  Administered 2013-01-31: 10 mL
  Filled 2013-01-31: qty 10

## 2013-01-31 MED ORDER — HEPARIN SOD (PORK) LOCK FLUSH 100 UNIT/ML IV SOLN
500.0000 [IU] | Freq: Once | INTRAVENOUS | Status: AC | PRN
  Administered 2013-01-31: 500 [IU]
  Filled 2013-01-31: qty 5

## 2013-01-31 MED ORDER — ACETAMINOPHEN 325 MG PO TABS
650.0000 mg | ORAL_TABLET | Freq: Once | ORAL | Status: AC
  Administered 2013-01-31: 650 mg via ORAL

## 2013-01-31 MED ORDER — ACETAMINOPHEN 325 MG PO TABS
ORAL_TABLET | ORAL | Status: AC
  Filled 2013-01-31: qty 2

## 2013-01-31 NOTE — Patient Instructions (Addendum)

## 2013-02-01 ENCOUNTER — Other Ambulatory Visit: Payer: BC Managed Care – PPO | Admitting: Lab

## 2013-02-01 ENCOUNTER — Ambulatory Visit: Payer: BC Managed Care – PPO

## 2013-02-01 DIAGNOSIS — Z0000 Encounter for general adult medical examination without abnormal findings: Secondary | ICD-10-CM

## 2013-02-01 LAB — LIPID PANEL
Cholesterol: 139 mg/dL (ref 0–200)
HDL: 42.9 mg/dL (ref >39.00)
Total CHOL/HDL Ratio: 3
Triglycerides: 292 mg/dL — ABNORMAL HIGH (ref 0.0–149.0)
VLDL: 58.4 mg/dL — ABNORMAL HIGH (ref 0.0–40.0)

## 2013-02-04 ENCOUNTER — Encounter: Payer: Self-pay | Admitting: Family

## 2013-02-21 ENCOUNTER — Encounter: Payer: Self-pay | Admitting: Physician Assistant

## 2013-02-21 ENCOUNTER — Ambulatory Visit (HOSPITAL_BASED_OUTPATIENT_CLINIC_OR_DEPARTMENT_OTHER): Payer: BC Managed Care – PPO

## 2013-02-21 ENCOUNTER — Ambulatory Visit (HOSPITAL_BASED_OUTPATIENT_CLINIC_OR_DEPARTMENT_OTHER): Payer: BC Managed Care – PPO | Admitting: Physician Assistant

## 2013-02-21 ENCOUNTER — Other Ambulatory Visit (HOSPITAL_BASED_OUTPATIENT_CLINIC_OR_DEPARTMENT_OTHER): Payer: BC Managed Care – PPO | Admitting: Lab

## 2013-02-21 VITALS — BP 129/88 | HR 85 | Temp 99.2°F | Resp 18 | Ht 67.0 in | Wt 127.5 lb

## 2013-02-21 DIAGNOSIS — Z1231 Encounter for screening mammogram for malignant neoplasm of breast: Secondary | ICD-10-CM

## 2013-02-21 DIAGNOSIS — Z5112 Encounter for antineoplastic immunotherapy: Secondary | ICD-10-CM

## 2013-02-21 DIAGNOSIS — C50911 Malignant neoplasm of unspecified site of right female breast: Secondary | ICD-10-CM

## 2013-02-21 DIAGNOSIS — C50119 Malignant neoplasm of central portion of unspecified female breast: Secondary | ICD-10-CM

## 2013-02-21 DIAGNOSIS — I10 Essential (primary) hypertension: Secondary | ICD-10-CM

## 2013-02-21 DIAGNOSIS — Z170 Estrogen receptor positive status [ER+]: Secondary | ICD-10-CM

## 2013-02-21 LAB — COMPREHENSIVE METABOLIC PANEL (CC13)
ALT: 23 U/L (ref 0–55)
Albumin: 3.6 g/dL (ref 3.5–5.0)
Alkaline Phosphatase: 47 U/L (ref 40–150)
Anion Gap: 11 mEq/L (ref 3–11)
BUN: 18.9 mg/dL (ref 7.0–26.0)
CO2: 23 mEq/L (ref 22–29)
Calcium: 8.8 mg/dL (ref 8.4–10.4)
Chloride: 104 mEq/L (ref 98–109)
Glucose: 114 mg/dl (ref 70–140)
Potassium: 4.2 mEq/L (ref 3.5–5.1)
Sodium: 138 mEq/L (ref 136–145)
Total Protein: 6.7 g/dL (ref 6.4–8.3)

## 2013-02-21 LAB — CBC WITH DIFFERENTIAL/PLATELET
Basophils Absolute: 0 10*3/uL (ref 0.0–0.1)
EOS%: 0.2 % (ref 0.0–7.0)
Eosinophils Absolute: 0 10*3/uL (ref 0.0–0.5)
HCT: 39.3 % (ref 34.8–46.6)
HGB: 13.3 g/dL (ref 11.6–15.9)
MCH: 31.2 pg (ref 25.1–34.0)
MCHC: 33.9 g/dL (ref 31.5–36.0)
MCV: 92.1 fL (ref 79.5–101.0)
MONO%: 6.6 % (ref 0.0–14.0)
NEUT#: 3.7 10*3/uL (ref 1.5–6.5)
NEUT%: 68.5 % (ref 38.4–76.8)
RBC: 4.26 10*6/uL (ref 3.70–5.45)
RDW: 13.5 % (ref 11.2–14.5)
WBC: 5.3 10*3/uL (ref 3.9–10.3)
lymph#: 1.3 10*3/uL (ref 0.9–3.3)

## 2013-02-21 MED ORDER — SODIUM CHLORIDE 0.9 % IV SOLN
Freq: Once | INTRAVENOUS | Status: AC
  Administered 2013-02-21: 15:00:00 via INTRAVENOUS

## 2013-02-21 MED ORDER — ACETAMINOPHEN 325 MG PO TABS
ORAL_TABLET | ORAL | Status: AC
  Filled 2013-02-21: qty 2

## 2013-02-21 MED ORDER — DIPHENHYDRAMINE HCL 25 MG PO CAPS
ORAL_CAPSULE | ORAL | Status: AC
  Filled 2013-02-21: qty 2

## 2013-02-21 MED ORDER — SODIUM CHLORIDE 0.9 % IJ SOLN
10.0000 mL | INTRAMUSCULAR | Status: DC | PRN
  Administered 2013-02-21: 10 mL
  Filled 2013-02-21: qty 10

## 2013-02-21 MED ORDER — ACETAMINOPHEN 325 MG PO TABS
650.0000 mg | ORAL_TABLET | Freq: Once | ORAL | Status: AC
  Administered 2013-02-21: 650 mg via ORAL

## 2013-02-21 MED ORDER — HEPARIN SOD (PORK) LOCK FLUSH 100 UNIT/ML IV SOLN
500.0000 [IU] | Freq: Once | INTRAVENOUS | Status: AC | PRN
  Administered 2013-02-21: 500 [IU]
  Filled 2013-02-21: qty 5

## 2013-02-21 MED ORDER — DIPHENHYDRAMINE HCL 25 MG PO CAPS
50.0000 mg | ORAL_CAPSULE | Freq: Once | ORAL | Status: AC
  Administered 2013-02-21: 50 mg via ORAL

## 2013-02-21 MED ORDER — TRASTUZUMAB CHEMO INJECTION 440 MG
6.0000 mg/kg | Freq: Once | INTRAVENOUS | Status: AC
  Administered 2013-02-21: 357 mg via INTRAVENOUS
  Filled 2013-02-21: qty 17

## 2013-02-21 NOTE — Patient Instructions (Signed)
[facility] Discharge Instructions for Patients Receiving Chemotherapy  Today you received the following chemotherapy agents [name].  To help prevent nausea and vomiting after your treatment, we encourage you to take your nausea medication as prescribed.   If you develop nausea and vomiting that is not controlled by your nausea medication, call the clinic.   BELOW ARE SYMPTOMS THAT SHOULD BE REPORTED IMMEDIATELY:  *FEVER GREATER THAN 100.5 F  *CHILLS WITH OR WITHOUT FEVER  NAUSEA AND VOMITING THAT IS NOT CONTROLLED WITH YOUR NAUSEA MEDICATION  *UNUSUAL SHORTNESS OF BREATH  *UNUSUAL BRUISING OR BLEEDING  TENDERNESS IN MOUTH AND THROAT WITH OR WITHOUT PRESENCE OF ULCERS  *URINARY PROBLEMS  *BOWEL PROBLEMS  UNUSUAL RASH Items with * indicate a potential emergency and should be followed up as soon as possible.  Feel free to call the clinic you have any questions or concerns. The clinic phone number is [phone].    

## 2013-02-21 NOTE — Telephone Encounter (Signed)
appts made and printed. Pt is aware that Christina Daniel will call her with her appt for echo/ bensimhmon. sw Christina Daniel and she made me aware that Dec. Schedule is not out yet. She placed the pt on their call list and will call the pt w/ her appts...td
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
## 2013-02-22 ENCOUNTER — Other Ambulatory Visit: Payer: BC Managed Care – PPO | Admitting: Lab

## 2013-03-14 ENCOUNTER — Other Ambulatory Visit (HOSPITAL_BASED_OUTPATIENT_CLINIC_OR_DEPARTMENT_OTHER): Payer: BC Managed Care – PPO | Admitting: Lab

## 2013-03-14 ENCOUNTER — Ambulatory Visit (HOSPITAL_BASED_OUTPATIENT_CLINIC_OR_DEPARTMENT_OTHER): Payer: BC Managed Care – PPO

## 2013-03-14 VITALS — BP 118/77 | HR 83 | Temp 98.5°F

## 2013-03-14 DIAGNOSIS — Z5112 Encounter for antineoplastic immunotherapy: Secondary | ICD-10-CM

## 2013-03-14 DIAGNOSIS — C50911 Malignant neoplasm of unspecified site of right female breast: Secondary | ICD-10-CM

## 2013-03-14 DIAGNOSIS — C50119 Malignant neoplasm of central portion of unspecified female breast: Secondary | ICD-10-CM

## 2013-03-14 LAB — CBC WITH DIFFERENTIAL/PLATELET
Basophils Absolute: 0 10*3/uL (ref 0.0–0.1)
Eosinophils Absolute: 0 10*3/uL (ref 0.0–0.5)
HGB: 14 g/dL (ref 11.6–15.9)
LYMPH%: 34.7 % (ref 14.0–49.7)
MCH: 31.3 pg (ref 25.1–34.0)
MCV: 92.4 fL (ref 79.5–101.0)
MONO%: 6.1 % (ref 0.0–14.0)
NEUT#: 2.4 10*3/uL (ref 1.5–6.5)
Platelets: 226 10*3/uL (ref 145–400)
RBC: 4.47 10*6/uL (ref 3.70–5.45)
RDW: 12.7 % (ref 11.2–14.5)

## 2013-03-14 MED ORDER — SODIUM CHLORIDE 0.9 % IV SOLN
Freq: Once | INTRAVENOUS | Status: AC
  Administered 2013-03-14: 14:00:00 via INTRAVENOUS

## 2013-03-14 MED ORDER — ACETAMINOPHEN 325 MG PO TABS
650.0000 mg | ORAL_TABLET | Freq: Once | ORAL | Status: AC
  Administered 2013-03-14: 650 mg via ORAL

## 2013-03-14 MED ORDER — ACETAMINOPHEN 325 MG PO TABS
ORAL_TABLET | ORAL | Status: AC
  Filled 2013-03-14: qty 2

## 2013-03-14 MED ORDER — HEPARIN SOD (PORK) LOCK FLUSH 100 UNIT/ML IV SOLN
500.0000 [IU] | Freq: Once | INTRAVENOUS | Status: AC | PRN
  Administered 2013-03-14: 500 [IU]
  Filled 2013-03-14: qty 5

## 2013-03-14 MED ORDER — TRASTUZUMAB CHEMO INJECTION 440 MG
6.0000 mg/kg | Freq: Once | INTRAVENOUS | Status: AC
  Administered 2013-03-14: 357 mg via INTRAVENOUS
  Filled 2013-03-14: qty 17

## 2013-03-14 MED ORDER — SODIUM CHLORIDE 0.9 % IJ SOLN
10.0000 mL | INTRAMUSCULAR | Status: DC | PRN
  Administered 2013-03-14: 10 mL
  Filled 2013-03-14: qty 10

## 2013-03-14 MED ORDER — DIPHENHYDRAMINE HCL 25 MG PO CAPS
ORAL_CAPSULE | ORAL | Status: AC
  Filled 2013-03-14: qty 2

## 2013-03-14 MED ORDER — DIPHENHYDRAMINE HCL 25 MG PO CAPS
50.0000 mg | ORAL_CAPSULE | Freq: Once | ORAL | Status: AC
  Administered 2013-03-14: 50 mg via ORAL

## 2013-03-14 NOTE — Patient Instructions (Signed)
[facility] Discharge Instructions for Patients Receiving Chemotherapy  Today you received the following chemotherapy agents herceptin.     If you develop nausea and vomiting, call the clinic.   BELOW ARE SYMPTOMS THAT SHOULD BE REPORTED IMMEDIATELY:  *FEVER GREATER THAN 100.5 F  *CHILLS WITH OR WITHOUT FEVER  NAUSEA AND VOMITING THAT IS NOT CONTROLLED WITH YOUR NAUSEA MEDICATION  *UNUSUAL SHORTNESS OF BREATH  *UNUSUAL BRUISING OR BLEEDING  TENDERNESS IN MOUTH AND THROAT WITH OR WITHOUT PRESENCE OF ULCERS  *URINARY PROBLEMS  *BOWEL PROBLEMS  UNUSUAL RASH Items with * indicate a potential emergency and should be followed up as soon as possible.  Feel free to call the clinic you have any questions or concerns. The clinic phone number is [phone].    

## 2013-03-15 ENCOUNTER — Other Ambulatory Visit: Payer: BC Managed Care – PPO | Admitting: Lab

## 2013-03-15 ENCOUNTER — Ambulatory Visit: Payer: BC Managed Care – PPO

## 2013-03-18 ENCOUNTER — Encounter: Payer: Self-pay | Admitting: Interventional Cardiology

## 2013-03-18 ENCOUNTER — Encounter: Payer: Self-pay | Admitting: Physician Assistant

## 2013-03-20 ENCOUNTER — Ambulatory Visit: Payer: BC Managed Care – PPO | Admitting: Interventional Cardiology

## 2013-04-04 ENCOUNTER — Ambulatory Visit (HOSPITAL_BASED_OUTPATIENT_CLINIC_OR_DEPARTMENT_OTHER): Payer: BC Managed Care – PPO

## 2013-04-04 ENCOUNTER — Other Ambulatory Visit (HOSPITAL_BASED_OUTPATIENT_CLINIC_OR_DEPARTMENT_OTHER): Payer: BC Managed Care – PPO | Admitting: Lab

## 2013-04-04 VITALS — BP 135/93 | HR 71 | Temp 98.0°F | Resp 20

## 2013-04-04 DIAGNOSIS — Z5112 Encounter for antineoplastic immunotherapy: Secondary | ICD-10-CM

## 2013-04-04 DIAGNOSIS — C50911 Malignant neoplasm of unspecified site of right female breast: Secondary | ICD-10-CM

## 2013-04-04 DIAGNOSIS — C50119 Malignant neoplasm of central portion of unspecified female breast: Secondary | ICD-10-CM

## 2013-04-04 LAB — CBC WITH DIFFERENTIAL/PLATELET
Basophils Absolute: 0 10*3/uL (ref 0.0–0.1)
EOS%: 0.4 % (ref 0.0–7.0)
Eosinophils Absolute: 0 10*3/uL (ref 0.0–0.5)
HCT: 37.3 % (ref 34.8–46.6)
HGB: 12.6 g/dL (ref 11.6–15.9)
LYMPH%: 33.5 % (ref 14.0–49.7)
MCH: 31.3 pg (ref 25.1–34.0)
MCV: 92.8 fL (ref 79.5–101.0)
MONO%: 7.3 % (ref 0.0–14.0)
NEUT#: 2.7 10*3/uL (ref 1.5–6.5)
NEUT%: 58.4 % (ref 38.4–76.8)
lymph#: 1.6 10*3/uL (ref 0.9–3.3)

## 2013-04-04 LAB — COMPREHENSIVE METABOLIC PANEL (CC13)
ALT: 25 U/L (ref 0–55)
AST: 24 U/L (ref 5–34)
Alkaline Phosphatase: 51 U/L (ref 40–150)
Creatinine: 0.7 mg/dL (ref 0.6–1.1)
Glucose: 86 mg/dl (ref 70–140)
Total Bilirubin: 0.46 mg/dL (ref 0.20–1.20)

## 2013-04-04 MED ORDER — SODIUM CHLORIDE 0.9 % IJ SOLN
10.0000 mL | INTRAMUSCULAR | Status: DC | PRN
  Administered 2013-04-04: 10 mL
  Filled 2013-04-04: qty 10

## 2013-04-04 MED ORDER — SODIUM CHLORIDE 0.9 % IV SOLN
6.0000 mg/kg | Freq: Once | INTRAVENOUS | Status: AC
  Administered 2013-04-04: 357 mg via INTRAVENOUS
  Filled 2013-04-04: qty 17

## 2013-04-04 MED ORDER — ACETAMINOPHEN 325 MG PO TABS
ORAL_TABLET | ORAL | Status: AC
  Filled 2013-04-04: qty 2

## 2013-04-04 MED ORDER — HEPARIN SOD (PORK) LOCK FLUSH 100 UNIT/ML IV SOLN
500.0000 [IU] | Freq: Once | INTRAVENOUS | Status: AC | PRN
  Administered 2013-04-04: 500 [IU]
  Filled 2013-04-04: qty 5

## 2013-04-04 MED ORDER — ACETAMINOPHEN 325 MG PO TABS
650.0000 mg | ORAL_TABLET | Freq: Once | ORAL | Status: AC
  Administered 2013-04-04: 650 mg via ORAL

## 2013-04-04 MED ORDER — DIPHENHYDRAMINE HCL 25 MG PO CAPS
ORAL_CAPSULE | ORAL | Status: AC
  Filled 2013-04-04: qty 2

## 2013-04-04 MED ORDER — SODIUM CHLORIDE 0.9 % IV SOLN
Freq: Once | INTRAVENOUS | Status: AC
  Administered 2013-04-04: 15:00:00 via INTRAVENOUS

## 2013-04-04 MED ORDER — DIPHENHYDRAMINE HCL 25 MG PO CAPS
50.0000 mg | ORAL_CAPSULE | Freq: Once | ORAL | Status: AC
  Administered 2013-04-04: 50 mg via ORAL

## 2013-04-04 NOTE — Patient Instructions (Signed)
[facility] Discharge Instructions for Patients Receiving Chemotherapy  Today you received the following chemotherapy agents herceptin.     If you develop nausea and vomiting, call the clinic.   BELOW ARE SYMPTOMS THAT SHOULD BE REPORTED IMMEDIATELY:  *FEVER GREATER THAN 100.5 F  *CHILLS WITH OR WITHOUT FEVER  NAUSEA AND VOMITING THAT IS NOT CONTROLLED WITH YOUR NAUSEA MEDICATION  *UNUSUAL SHORTNESS OF BREATH  *UNUSUAL BRUISING OR BLEEDING  TENDERNESS IN MOUTH AND THROAT WITH OR WITHOUT PRESENCE OF ULCERS  *URINARY PROBLEMS  *BOWEL PROBLEMS  UNUSUAL RASH Items with * indicate a potential emergency and should be followed up as soon as possible.  Feel free to call the clinic you have any questions or concerns. The clinic phone number is [phone].

## 2013-04-05 ENCOUNTER — Other Ambulatory Visit: Payer: BC Managed Care – PPO | Admitting: Lab

## 2013-04-07 ENCOUNTER — Encounter: Payer: Self-pay | Admitting: Family

## 2013-04-08 ENCOUNTER — Other Ambulatory Visit: Payer: Self-pay | Admitting: Family

## 2013-04-08 MED ORDER — ALENDRONATE SODIUM 70 MG PO TABS: 70.0000 mg | ORAL_TABLET | ORAL | Status: DC

## 2013-04-09 ENCOUNTER — Ambulatory Visit (HOSPITAL_BASED_OUTPATIENT_CLINIC_OR_DEPARTMENT_OTHER)
Admission: RE | Admit: 2013-04-09 | Discharge: 2013-04-09 | Disposition: A | Discharge status: Home / Self Care | Payer: BC Managed Care – PPO | Source: Ambulatory Visit | Attending: Internal Medicine | Admitting: Internal Medicine

## 2013-04-09 ENCOUNTER — Ambulatory Visit (HOSPITAL_COMMUNITY)
Admission: RE | Admit: 2013-04-09 | Discharge: 2013-04-09 | Disposition: A | Discharge status: Home / Self Care | Payer: BC Managed Care – PPO | Source: Ambulatory Visit | Attending: Interventional Cardiology | Admitting: Interventional Cardiology

## 2013-04-09 VITALS — BP 138/84 | HR 77 | Wt 126.0 lb

## 2013-04-09 DIAGNOSIS — I10 Essential (primary) hypertension: Secondary | ICD-10-CM

## 2013-04-09 DIAGNOSIS — C50911 Malignant neoplasm of unspecified site of right female breast: Secondary | ICD-10-CM

## 2013-04-09 DIAGNOSIS — C50919 Malignant neoplasm of unspecified site of unspecified female breast: Secondary | ICD-10-CM

## 2013-04-09 DIAGNOSIS — I079 Rheumatic tricuspid valve disease, unspecified: Secondary | ICD-10-CM | POA: Insufficient documentation

## 2013-04-09 DIAGNOSIS — I519 Heart disease, unspecified: Secondary | ICD-10-CM
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
## 2013-04-09 NOTE — Patient Instructions (Signed)
Follow up in as needed

## 2013-04-09 NOTE — Progress Notes (Signed)
*  PRELIMINARY RESULTS* Echocardiogram 2D Echocardiogram has been performed.  Christina Daniel 04/09/2013, 10:19 AM

## 2013-04-10 ENCOUNTER — Encounter (HOSPITAL_COMMUNITY): Payer: BC Managed Care – PPO

## 2013-04-12 ENCOUNTER — Ambulatory Visit (INDEPENDENT_AMBULATORY_CARE_PROVIDER_SITE_OTHER): Payer: BC Managed Care – PPO | Admitting: Surgery

## 2013-04-12 ENCOUNTER — Other Ambulatory Visit: Payer: Self-pay | Admitting: Family

## 2013-04-12 ENCOUNTER — Encounter (INDEPENDENT_AMBULATORY_CARE_PROVIDER_SITE_OTHER): Payer: Self-pay | Admitting: Surgery

## 2013-04-12 ENCOUNTER — Encounter: Payer: Self-pay | Admitting: Family

## 2013-04-12 VITALS — BP 120/90 | HR 76 | Temp 98.4°F | Resp 14 | Ht 67.0 in | Wt 127.0 lb

## 2013-04-12 DIAGNOSIS — Z853 Personal history of malignant neoplasm of breast: Secondary | ICD-10-CM

## 2013-04-12 DIAGNOSIS — Z1211 Encounter for screening for malignant neoplasm of colon: Secondary | ICD-10-CM

## 2013-04-12 NOTE — Progress Notes (Signed)
NAME: Christina Daniel       DOB: 04/15/1951           DATE: 04/12/2013        MRN: [id number]  CC:   Chief Complaint  Patient presents with  . Breast Cancer Long Term Follow Up    LTFU port removal    [name] is a 62 y.o.Marland Kitchenfemale who presents for routine followup of her Right breast cancer, recurrent, diagnosed in Oct,  2013 and treated with mastectomy, node dissection , latissimus reconstruction by Dr Kelly Splinter and chemo. She has no problems or concerns on either side.She wants her port out  PFSH: She has had no significant changes since the last visit here.  ROS: There have been no significant changes since the last visit here  EXAM:  VS: BP 120/90  Pulse 76  Temp(Src) 98.4 F (36.9 C) (Temporal)  Resp 14  Ht 5\' 7"  (1.702 m)  Wt 127 lb (57.607 kg)  BMI 19.89 kg/m2  General: The patient is alert, oriented, generally healthy appearing, NAD. Mood and affect are normal.  Breasts:  S/P reconstruction on right, no evidence of local recurrence. Left is normal Lymphatics: She has no axillary or supraclavicular adenopathy on either side.  Extremities: Full ROM of the surgical side with no lymphedema noted.  Data Reviewed: Notes in Epic Impression: Doing well, with no evidence of recurrent cancer or new cancer  Plan: Will schedule port removal under local. She will do annual long term follow up here

## 2013-04-12 NOTE — Patient Instructions (Signed)
We will schedule port removal under local anesthesia

## 2013-04-12 NOTE — Progress Notes (Signed)
No questions

## 2013-04-15 ENCOUNTER — Encounter: Payer: Self-pay | Admitting: Family

## 2013-04-15 ENCOUNTER — Ambulatory Visit (HOSPITAL_BASED_OUTPATIENT_CLINIC_OR_DEPARTMENT_OTHER): Payer: BC Managed Care – PPO | Admitting: Oncology

## 2013-04-15 VITALS — BP 129/87 | HR 81 | Temp 98.1°F | Resp 18 | Ht 67.0 in | Wt 126.3 lb

## 2013-04-15 DIAGNOSIS — C50911 Malignant neoplasm of unspecified site of right female breast: Secondary | ICD-10-CM

## 2013-04-15 DIAGNOSIS — C50119 Malignant neoplasm of central portion of unspecified female breast: Secondary | ICD-10-CM

## 2013-04-15 DIAGNOSIS — Z170 Estrogen receptor positive status [ER+]: Secondary | ICD-10-CM
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
## 2013-04-16 ENCOUNTER — Other Ambulatory Visit: Payer: Self-pay | Admitting: Oncology

## 2013-04-16 ENCOUNTER — Encounter (HOSPITAL_BASED_OUTPATIENT_CLINIC_OR_DEPARTMENT_OTHER)
Admission: RE | Disposition: A | Discharge status: Home / Self Care | Payer: Self-pay | Source: Ambulatory Visit | Attending: Surgery

## 2013-04-16 ENCOUNTER — Ambulatory Visit (HOSPITAL_BASED_OUTPATIENT_CLINIC_OR_DEPARTMENT_OTHER)
Admission: RE | Admit: 2013-04-16 | Discharge: 2013-04-16 | Disposition: A | Discharge status: Home / Self Care | Payer: BC Managed Care – PPO | Source: Ambulatory Visit | Attending: Surgery | Admitting: Surgery

## 2013-04-16 ENCOUNTER — Encounter (HOSPITAL_BASED_OUTPATIENT_CLINIC_OR_DEPARTMENT_OTHER): Payer: Self-pay

## 2013-04-16 DIAGNOSIS — Z452 Encounter for adjustment and management of vascular access device: Secondary | ICD-10-CM | POA: Insufficient documentation

## 2013-04-16 DIAGNOSIS — Z9010 Acquired absence of unspecified breast and nipple: Secondary | ICD-10-CM | POA: Insufficient documentation

## 2013-04-16 DIAGNOSIS — Z853 Personal history of malignant neoplasm of breast: Secondary | ICD-10-CM | POA: Insufficient documentation

## 2013-04-16 HISTORY — PX: PORT-A-CATH REMOVAL: SHX5289

## 2013-04-16 SURGERY — MINOR REMOVAL PORT-A-CATH
Anesthesia: LOCAL | Site: Chest | Laterality: Left

## 2013-04-16 MED ORDER — LIDOCAINE-EPINEPHRINE 0.5 %-1:200000 IJ SOLN
INTRAMUSCULAR | Status: AC
  Filled 2013-04-16: qty 1

## 2013-04-16 MED ORDER — LIDOCAINE HCL (PF) 1 % IJ SOLN
INTRAMUSCULAR | Status: AC
  Filled 2013-04-16: qty 30

## 2013-04-16 MED ORDER — SODIUM BICARBONATE 4 % IV SOLN
INTRAVENOUS | Status: AC
  Filled 2013-04-16: qty 5

## 2013-04-16 MED ORDER — SODIUM BICARBONATE 4 % IV SOLN
INTRAVENOUS | Status: DC | PRN
  Administered 2013-04-16: 1 mL via INTRAVENOUS

## 2013-04-16 MED ORDER — BUPIVACAINE-EPINEPHRINE 0.5% -1:200000 IJ SOLN
INTRAMUSCULAR | Status: DC | PRN
  Administered 2013-04-16: 5 mL

## 2013-04-16 MED ORDER — LIDOCAINE HCL (PF) 1 % IJ SOLN
INTRAMUSCULAR | Status: DC | PRN
  Administered 2013-04-16: 5 mL via INTRADERMAL

## 2013-04-16 SURGICAL SUPPLY — 28 items
ADH SKN CLS APL DERMABOND .7 (GAUZE/BANDAGES/DRESSINGS)
APL SKNCLS STERI-STRIP NONHPOA (GAUZE/BANDAGES/DRESSINGS)
BENZOIN TINCTURE PRP APPL 2/3 (GAUZE/BANDAGES/DRESSINGS) IMPLANT
BLADE SURG 15 STRL LF DISP TIS (BLADE) ×1 IMPLANT
BLADE SURG 15 STRL SS (BLADE) ×2
CHLORAPREP W/TINT 26ML (MISCELLANEOUS) ×2 IMPLANT
DERMABOND ADVANCED (GAUZE/BANDAGES/DRESSINGS)
DERMABOND ADVANCED .7 DNX12 (GAUZE/BANDAGES/DRESSINGS) IMPLANT
DRSG TEGADERM 4X4.75 (GAUZE/BANDAGES/DRESSINGS) IMPLANT
ELECT REM PT RETURN 9FT ADLT (ELECTROSURGICAL)
ELECTRODE REM PT RTRN 9FT ADLT (ELECTROSURGICAL) IMPLANT
GAUZE SPONGE 4X4 16PLY XRAY LF (GAUZE/BANDAGES/DRESSINGS) IMPLANT
GLOVE EUDERMIC 7 POWDERFREE (GLOVE) ×2 IMPLANT
GLOVE SURG SS PI 7.0 STRL IVOR (GLOVE) ×2 IMPLANT
MARKER SKIN DUAL TIP RULER LAB (MISCELLANEOUS) ×2 IMPLANT
NDL HYPO 25X1 1.5 SAFETY (NEEDLE) ×1 IMPLANT
NDL SAFETY ECLIPSE 18X1.5 (NEEDLE) ×1 IMPLANT
NEEDLE HYPO 18GX1.5 SHARP (NEEDLE) ×2
NEEDLE HYPO 25X1 1.5 SAFETY (NEEDLE) ×2 IMPLANT
PENCIL BUTTON HOLSTER BLD 10FT (ELECTRODE) IMPLANT
SPONGE GAUZE 4X4 12PLY STER LF (GAUZE/BANDAGES/DRESSINGS) ×8 IMPLANT
STRIP CLOSURE SKIN 1/2X4 (GAUZE/BANDAGES/DRESSINGS) IMPLANT
SUT MNCRL AB 4-0 PS2 18 (SUTURE) ×2 IMPLANT
SUT VIC AB 4-0 P-3 18XBRD (SUTURE) IMPLANT
SUT VIC AB 4-0 P3 18 (SUTURE)
SUT VIC AB 4-0 SH 18 (SUTURE) IMPLANT
SUT VICRYL 3-0 CR8 SH (SUTURE) ×2 IMPLANT
SYR CONTROL 10ML LL (SYRINGE) ×2 IMPLANT

## 2013-04-16 NOTE — H&P (View-Only) (Signed)
NAME: Christina Daniel       DOB: 02/05/1951           DATE: 04/12/2013        MRN: [id number]  CC:   Chief Complaint  Patient presents with  . Breast Cancer Long Term Follow Up    LTFU port removal    [name] is a 62 y.o..female who presents for routine followup of her Right breast cancer, recurrent, diagnosed in Oct,  2013 and treated with mastectomy, node dissection , latissimus reconstruction by Dr Sanger and chemo. She has no problems or concerns on either side.She wants her port out  PFSH: She has had no significant changes since the last visit here.  ROS: There have been no significant changes since the last visit here  EXAM:  VS: BP 120/90  Pulse 76  Temp(Src) 98.4 F (36.9 C) (Temporal)  Resp 14  Ht 5' 7" (1.702 m)  Wt 127 lb (57.607 kg)  BMI 19.89 kg/m2  General: The patient is alert, oriented, generally healthy appearing, NAD. Mood and affect are normal.  Breasts:  S/P reconstruction on right, no evidence of local recurrence. Left is normal Lymphatics: She has no axillary or supraclavicular adenopathy on either side.  Extremities: Full ROM of the surgical side with no lymphedema noted.  Data Reviewed: Notes in Epic Impression: Doing well, with no evidence of recurrent cancer or new cancer  Plan: Will schedule port removal under local. She will do annual long term follow up here  

## 2013-04-16 NOTE — Op Note (Signed)
Christina Daniel Aug 27, 1950 [id number] 04/16/2013   Preoperative diagnosis: Un-Needed PAC  Postoperative diagnosis: Same  Procedure: Portacath Removal  Surgeon: Currie Paris, MD, FACS  Anesthesia:Local    Clinical History and Indications: The patient has finished her chemotherapy and no longer needs a port. She wishes to have it removed.  Procedure: The patient was seen in the preoperative area and we confirmed the plans for the procedure as noted above. The Port-A-Cath site was identified and marked. The patient had no further questions.  The patient was then taken into the procedure room. The timeout was done. The area over the Port-A-Cath was anesthetized with 1% Xylocaine with epinephrine mixed with 0.5% marcaine plain. I waited about 10 minutes and then the area was prepped and draped.  The old scar was opened. The capsule around the port opened and the port identified. The holding sutures were cut. The catheter was backed partially out of its tract. A figure 8 3-0 Vicryl suture was placed, the tubing removed, and the suture tied down to prevent backbleeding.  The port was then removed from its pocket. I made sure everything was dry. The incision was closed with 3-0 Vicryl, 4-0 Monocryl subcuticular, and Dermabond.  The patient tolerated the procedure well. There were no complications.  Currie Paris, MD, FACS 04/16/2013 3:04 PM

## 2013-04-16 NOTE — Interval H&P Note (Signed)
History and Physical Interval Note:  04/16/2013 2:41 PM  Christina Daniel  has presented today for surgery, with the diagnosis of un-needed port  The various methods of treatment have been discussed with the patient and family. After consideration of risks, benefits and other options for treatment, the patient has consented to  Procedure(s): MINOR REMOVAL PORT-A-CATH (N/A) as a surgical intervention .  The patient's history has been reviewed, patient examined, no change in status, stable for surgery.  I have reviewed the patient's chart and labs.  Questions were answered to the patient's satisfaction.     [name]

## 2013-04-17 ENCOUNTER — Telehealth: Payer: Self-pay | Admitting: Oncology

## 2013-04-18 ENCOUNTER — Encounter (HOSPITAL_BASED_OUTPATIENT_CLINIC_OR_DEPARTMENT_OTHER): Payer: Self-pay | Admitting: Surgery

## 2013-04-22 ENCOUNTER — Telehealth (INDEPENDENT_AMBULATORY_CARE_PROVIDER_SITE_OTHER): Payer: Self-pay | Admitting: *Deleted

## 2013-04-22 NOTE — Telephone Encounter (Signed)
LMOM for pt to return my call.  I was calling to check in on pt postoperatively and to inform her of her postop appt with Dr. Jamey Ripa on 04/30/13 @ 1:00pm.

## 2013-04-24 ENCOUNTER — Encounter: Payer: Self-pay | Admitting: Family

## 2013-04-24 ENCOUNTER — Other Ambulatory Visit: Payer: Self-pay

## 2013-04-24 MED ORDER — METOPROLOL SUCCINATE ER 25 MG PO TB24: 25.0000 mg | ORAL_TABLET | Freq: Every day | ORAL | Status: DC

## 2013-04-29 ENCOUNTER — Encounter (INDEPENDENT_AMBULATORY_CARE_PROVIDER_SITE_OTHER): Payer: BC Managed Care – PPO | Admitting: Surgery

## 2013-04-29 ENCOUNTER — Encounter (INDEPENDENT_AMBULATORY_CARE_PROVIDER_SITE_OTHER): Payer: Self-pay | Admitting: Surgery

## 2013-04-29 ENCOUNTER — Ambulatory Visit: Payer: BC Managed Care – PPO | Admitting: Family

## 2013-04-29 ENCOUNTER — Ambulatory Visit (INDEPENDENT_AMBULATORY_CARE_PROVIDER_SITE_OTHER): Payer: BC Managed Care – PPO | Admitting: Surgery

## 2013-04-29 VITALS — BP 128/72 | HR 72 | Temp 98.8°F | Resp 14 | Ht 67.0 in | Wt 127.4 lb

## 2013-04-29 DIAGNOSIS — Z09 Encounter for follow-up examination after completed treatment for conditions other than malignant neoplasm: Secondary | ICD-10-CM

## 2013-04-29 NOTE — Progress Notes (Signed)
NAME: Christina Daniel                                            DOB: 10/23/1950 DATE: 04/29/2013                                                   MRN: [id number]  CC:  Chief Complaint  Patient presents with  . Routine Post Op    p/o PAC removal    HPI: This patient comes in for post op follow-up .Sheunderwent port removal on 04/16/13. She feels that she is doing well.  PE:  VITAL SIGNS: BP 128/72  Pulse 72  Temp(Src) 98.8 F (37.1 C) (Temporal)  Resp 14  Ht 5\' 7"  (1.702 m)  Wt 127 lb 6.4 oz (57.788 kg)  BMI 19.95 kg/m2  General: The patient appears to be healthy, NAD Incision healing well   IMPRESSION: The patient is doing well S/P Port removal.    PLAN: RTC PRN. She will check with Dr Darnelle Catalan about long term f/u here. Told her we should see her in a year.

## 2013-04-29 NOTE — Patient Instructions (Signed)
Come back to see [place] as Dr Eustace Moore

## 2013-04-30 ENCOUNTER — Encounter (INDEPENDENT_AMBULATORY_CARE_PROVIDER_SITE_OTHER): Payer: BC Managed Care – PPO | Admitting: Surgery

## 2013-05-01 ENCOUNTER — Other Ambulatory Visit: Payer: Self-pay

## 2013-05-01 ENCOUNTER — Encounter: Payer: Self-pay | Admitting: Family

## 2013-05-01 ENCOUNTER — Ambulatory Visit: Payer: BC Managed Care – PPO | Admitting: Family

## 2013-05-01 MED ORDER — SIMVASTATIN 20 MG PO TABS: 20.0000 mg | ORAL_TABLET | Freq: Every day | ORAL | Status: DC

## 2013-05-24 ENCOUNTER — Other Ambulatory Visit: Payer: Self-pay | Admitting: Family

## 2013-06-04 ENCOUNTER — Encounter: Payer: Self-pay | Admitting: Physician Assistant

## 2013-06-18 ENCOUNTER — Other Ambulatory Visit: Payer: Self-pay | Admitting: Family

## 2013-06-19 ENCOUNTER — Other Ambulatory Visit: Payer: Self-pay | Admitting: Oncology

## 2013-06-19 DIAGNOSIS — C50911 Malignant neoplasm of unspecified site of right female breast: Secondary | ICD-10-CM

## 2013-06-20 ENCOUNTER — Encounter: Payer: Self-pay | Admitting: Physician Assistant

## 2013-06-20 NOTE — Telephone Encounter (Signed)
Called patient at in reference to her MyChart message about dizziness.   "I feel fine now.  It's very infrequent but today dizziness occurred more.  I'm usually in bed, about to get out of bed.  No nausea or vomiting, no fever or pain.  It last 2-3 seconds".  Denies visual changes or ear pain.  Takes toprol, HCTZ and Cozaar but denies low b/p stating she has "not checked b/p today but it runs 120 's over 85".  When asked she admits to sensation of items in the room moving at times.  Possible vertigo.  Will notify providers.  Asked that she get up slowly and not get out of bed without getting her bearings.  Also, check b/p more regularly to ensure levels are stable.  Drink 64 oz water daily to not get dehydrated.  And check with PCP or whoever prescribed the cardiac medications if b/p is low.

## 2013-06-25 ENCOUNTER — Encounter: Payer: Self-pay | Admitting: Family

## 2013-07-05 ENCOUNTER — Ambulatory Visit (INDEPENDENT_AMBULATORY_CARE_PROVIDER_SITE_OTHER): Payer: BC Managed Care – PPO | Admitting: Family

## 2013-07-05 ENCOUNTER — Encounter: Payer: Self-pay | Admitting: Family

## 2013-07-05 VITALS — BP 100/60 | HR 87 | Wt 128.0 lb

## 2013-07-05 DIAGNOSIS — F3289 Other specified depressive episodes: Secondary | ICD-10-CM

## 2013-07-05 DIAGNOSIS — E7800 Pure hypercholesterolemia, unspecified: Secondary | ICD-10-CM

## 2013-07-05 DIAGNOSIS — F329 Major depressive disorder, single episode, unspecified: Secondary | ICD-10-CM

## 2013-07-05 DIAGNOSIS — C50919 Malignant neoplasm of unspecified site of unspecified female breast: Secondary | ICD-10-CM

## 2013-07-05 DIAGNOSIS — I10 Essential (primary) hypertension: Secondary | ICD-10-CM

## 2013-07-05 DIAGNOSIS — C50911 Malignant neoplasm of unspecified site of right female breast: Secondary | ICD-10-CM

## 2013-07-05 LAB — CBC WITH DIFFERENTIAL/PLATELET
Basophils Absolute: 0 10*3/uL (ref 0.0–0.1)
Basophils Relative: 0.4 % (ref 0.0–3.0)
EOS PCT: 0.3 % (ref 0.0–5.0)
Eosinophils Absolute: 0 10*3/uL (ref 0.0–0.7)
HEMATOCRIT: 40.5 % (ref 36.0–46.0)
Hemoglobin: 13.6 g/dL (ref 12.0–15.0)
LYMPHS ABS: 1.1 10*3/uL (ref 0.7–4.0)
Lymphocytes Relative: 24.6 % (ref 12.0–46.0)
MCHC: 33.4 g/dL (ref 30.0–36.0)
MCV: 95.7 fl (ref 78.0–100.0)
MONOS PCT: 6.3 % (ref 3.0–12.0)
Monocytes Absolute: 0.3 10*3/uL (ref 0.1–1.0)
NEUTROS ABS: 3.1 10*3/uL (ref 1.4–7.7)
Neutrophils Relative %: 68.4 % (ref 43.0–77.0)
Platelets: 255 10*3/uL (ref 150.0–400.0)
RBC: 4.24 Mil/uL (ref 3.87–5.11)
RDW: 13.5 % (ref 11.5–14.6)
WBC: 4.5 10*3/uL (ref 4.5–10.5)

## 2013-07-05 LAB — COMPREHENSIVE METABOLIC PANEL
ALT: 28 U/L (ref 0–35)
AST: 28 U/L (ref 0–37)
Albumin: 4.1 g/dL (ref 3.5–5.2)
Alkaline Phosphatase: 40 U/L (ref 39–117)
BUN: 20 mg/dL (ref 6–23)
CO2: 25 meq/L (ref 19–32)
CREATININE: 0.7 mg/dL (ref 0.4–1.2)
Calcium: 9.7 mg/dL (ref 8.4–10.5)
Chloride: 106 mEq/L (ref 96–112)
GFR: 90.88 mL/min (ref >60.00)
GLUCOSE: 97 mg/dL (ref 70–99)
Potassium: 5 mEq/L (ref 3.5–5.1)
SODIUM: 146 meq/L — AB (ref 135–145)
Total Bilirubin: 0.9 mg/dL (ref 0.3–1.2)
Total Protein: 6.8 g/dL (ref 6.0–8.3)

## 2013-07-05 LAB — LIPID PANEL
Cholesterol: 183 mg/dL (ref 0–200)
HDL: 45.7 mg/dL (ref >39.00)
LDL Cholesterol: 66 mg/dL (ref 0–99)
TRIGLYCERIDES: 357 mg/dL — AB (ref 0.0–149.0)
Total CHOL/HDL Ratio: 4
VLDL: 71.4 mg/dL — ABNORMAL HIGH (ref 0.0–40.0)
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
## 2013-07-05 NOTE — Patient Instructions (Signed)
Hypertriglyceridemia  Diet for High blood levels of Triglycerides Most fats in food are triglycerides. Triglycerides in your blood are stored as fat in your body. High levels of triglycerides in your blood may put you at a greater risk for heart disease and stroke.  Normal triglyceride levels are less than 150 mg/dL. Borderline high levels are 150-199 mg/dl. High levels are 200 - 499 mg/dL, and very high triglyceride levels are greater than 500 mg/dL. The decision to treat high triglycerides is generally based on the level. For people with borderline or high triglyceride levels, treatment includes weight loss and exercise. Drugs are recommended for people with very high triglyceride levels. Many people who need treatment for high triglyceride levels have metabolic syndrome. This syndrome is a collection of disorders that often include: insulin resistance, high blood pressure, blood clotting problems, high cholesterol and triglycerides. TESTING PROCEDURE FOR TRIGLYCERIDES  You should not eat 4 hours before getting your triglycerides measured. The normal range of triglycerides is between 10 and 250 milligrams per deciliter (mg/dl). Some people may have extreme levels (1000 or above), but your triglyceride level may be too high if it is above 150 mg/dl, depending on what other risk factors you have for heart disease.  People with high blood triglycerides may also have high blood cholesterol levels. If you have high blood cholesterol as well as high blood triglycerides, your risk for heart disease is probably greater than if you only had high triglycerides. High blood cholesterol is one of the main risk factors for heart disease. CHANGING YOUR DIET  Your weight can affect your blood triglyceride level. If you are more than 20% above your ideal body weight, you may be able to lower your blood triglycerides by losing weight. Eating less and exercising regularly is the best way to combat this. Fat provides more  calories than any other food. The best way to lose weight is to eat less fat. Only 30% of your total calories should come from fat. Less than 7% of your diet should come from saturated fat. A diet low in fat and saturated fat is the same as a diet to decrease blood cholesterol. By eating a diet lower in fat, you may lose weight, lower your blood cholesterol, and lower your blood triglyceride level.  Eating a diet low in fat, especially saturated fat, may also help you lower your blood triglyceride level. Ask your dietitian to help you figure how much fat you can eat based on the number of calories your caregiver has prescribed for you.  Exercise, in addition to helping with weight loss may also help lower triglyceride levels.   Alcohol can increase blood triglycerides. You may need to stop drinking alcoholic beverages.  Too much carbohydrate in your diet may also increase your blood triglycerides. Some complex carbohydrates are necessary in your diet. These may include bread, rice, potatoes, other starchy vegetables and cereals.  Reduce "simple" carbohydrates. These may include pure sugars, candy, honey, and jelly without losing other nutrients. If you have the kind of high blood triglycerides that is affected by the amount of carbohydrates in your diet, you will need to eat less sugar and less high-sugar foods. Your caregiver can help you with this.  Adding 2-4 grams of fish oil (EPA+ DHA) may also help lower triglycerides. Speak with your caregiver before adding any supplements to your regimen. Following the Diet  Maintain your ideal weight. Your caregivers can help you with a diet. Generally, eating less food and getting more   exercise will help you lose weight. Joining a weight control group may also help. Ask your caregivers for a good weight control group in your area.  Eat low-fat foods instead of high-fat foods. This can help you lose weight too.  These foods are lower in fat. Eat MORE of these:    Dried beans, peas, and lentils.  Egg whites.  Low-fat cottage cheese.  Fish.  Lean cuts of meat, such as round, sirloin, rump, and flank (cut extra fat off meat you fix).  Whole grain breads, cereals and pasta.  Skim and nonfat dry milk.  Low-fat yogurt.  Poultry without the skin.  Cheese made with skim or part-skim milk, such as mozzarella, parmesan, farmers', ricotta, or pot cheese. These are higher fat foods. Eat LESS of these:   Whole milk and foods made from whole milk, such as American, blue, cheddar, monterey jack, and swiss cheese  High-fat meats, such as luncheon meats, sausages, knockwurst, bratwurst, hot dogs, ribs, corned beef, ground pork, and regular ground beef.  Fried foods. Limit saturated fats in your diet. Substituting unsaturated fat for saturated fat may decrease your blood triglyceride level. You will need to read package labels to know which products contain saturated fats.  These foods are high in saturated fat. Eat LESS of these:   Fried pork skins.  Whole milk.  Skin and fat from poultry.  Palm oil.  Butter.  Shortening.  Cream cheese.  Bacon.  Margarines and baked goods made from listed oils.  Vegetable shortenings.  Chitterlings.  Fat from meats.  Coconut oil.  Palm kernel oil.  Lard.  Cream.  Sour cream.  Fatback.  Coffee whiteners and non-dairy creamers made with these oils.  Cheese made from whole milk. Use unsaturated fats (both polyunsaturated and monounsaturated) moderately. Remember, even though unsaturated fats are better than saturated fats; you still want a diet low in total fat.  These foods are high in unsaturated fat:   Canola oil.  Sunflower oil.  Mayonnaise.  Almonds.  Peanuts.  Pine nuts.  Margarines made with these oils.  Safflower oil.  Olive oil.  Avocados.  Cashews.  Peanut butter.  Sunflower seeds.  Soybean oil.  Peanut  oil.  Olives.  Pecans.  Walnuts.  Pumpkin seeds. Avoid sugar and other high-sugar foods. This will decrease carbohydrates without decreasing other nutrients. Sugar in your food goes rapidly to your blood. When there is excess sugar in your blood, your liver may use it to make more triglycerides. Sugar also contains calories without other important nutrients.  Eat LESS of these:   Sugar, brown sugar, powdered sugar, jam, jelly, preserves, honey, syrup, molasses, pies, candy, cakes, cookies, frosting, pastries, colas, soft drinks, punches, fruit drinks, and regular gelatin.  Avoid alcohol. Alcohol, even more than sugar, may increase blood triglycerides. In addition, alcohol is high in calories and low in nutrients. Ask for sparkling water, or a diet soft drink instead of an alcoholic beverage. Suggestions for planning and preparing meals   Bake, broil, grill or roast meats instead of frying.  Remove fat from meats and skin from poultry before cooking.  Add spices, herbs, lemon juice or vinegar to vegetables instead of salt, rich sauces or gravies.  Use a non-stick skillet without fat or use no-stick sprays.  Cool and refrigerate stews and broth. Then remove the hardened fat floating on the surface before serving.  Refrigerate meat drippings and skim off fat to make low-fat gravies.  Serve more fish.  Use less butter,   margarine and other high-fat spreads on bread or vegetables.  Use skim or reconstituted non-fat dry milk for cooking.  Cook with low-fat cheeses.  Substitute low-fat yogurt or cottage cheese for all or part of the sour cream in recipes for sauces, dips or congealed salads.  Use half yogurt/half mayonnaise in salad recipes.  Substitute evaporated skim milk for cream. Evaporated skim milk or reconstituted non-fat dry milk can be whipped and substituted for whipped cream in certain recipes.  Choose fresh fruits for dessert instead of high-fat foods such as pies or  cakes. Fruits are naturally low in fat. When Dining Out   Order low-fat appetizers such as fruit or vegetable juice, pasta with vegetables or tomato sauce.  Select clear, rather than cream soups.  Ask that dressings and gravies be served on the side. Then use less of them.  Order foods that are baked, broiled, poached, steamed, stir-fried, or roasted.  Ask for margarine instead of butter, and use only a small amount.  Drink sparkling water, unsweetened tea or coffee, or diet soft drinks instead of alcohol or other sweet beverages. QUESTIONS AND ANSWERS ABOUT OTHER FATS IN THE BLOOD: SATURATED FAT, TRANS FAT, AND CHOLESTEROL What is trans fat? Trans fat is a type of fat that is formed when vegetable oil is hardened through a process called hydrogenation. This process helps makes foods more solid, gives them shape, and prolongs their shelf life. Trans fats are also called hydrogenated or partially hydrogenated oils.  What do saturated fat, trans fat, and cholesterol in foods have to do with heart disease? Saturated fat, trans fat, and cholesterol in the diet all raise the level of LDL "bad" cholesterol in the blood. The higher the LDL cholesterol, the greater the risk for coronary heart disease (CHD). Saturated fat and trans fat raise LDL similarly.  What foods contain saturated fat, trans fat, and cholesterol? High amounts of saturated fat are found in animal products, such as fatty cuts of meat, chicken skin, and full-fat dairy products like butter, whole milk, cream, and cheese, and in tropical vegetable oils such as palm, palm kernel, and coconut oil. Trans fat is found in some of the same foods as saturated fat, such as vegetable shortening, some margarines (especially hard or stick margarine), crackers, cookies, baked goods, fried foods, salad dressings, and other processed foods made with partially hydrogenated vegetable oils. Small amounts of trans fat also occur naturally in some animal  products, such as milk products, beef, and lamb. Foods high in cholesterol include liver, other organ meats, egg yolks, shrimp, and full-fat dairy products. How can I use the new food label to make heart-healthy food choices? Check the Nutrition Facts panel of the food label. Choose foods lower in saturated fat, trans fat, and cholesterol. For saturated fat and cholesterol, you can also use the Percent Daily Value (%DV): 5% DV or less is low, and 20% DV or more is high. (There is no %DV for trans fat.) Use the Nutrition Facts panel to choose foods low in saturated fat and cholesterol, and if the trans fat is not listed, read the ingredients and limit products that list shortening or hydrogenated or partially hydrogenated vegetable oil, which tend to be high in trans fat. POINTS TO REMEMBER:   Discuss your risk for heart disease with your caregivers, and take steps to reduce risk factors.  Change your diet. Choose foods that are low in saturated fat, trans fat, and cholesterol.  Add exercise to your daily routine if   it is not already being done. Participate in physical activity of moderate intensity, like brisk walking, for at least 30 minutes on most, and preferably all days of the week. No time? Break the 30 minutes into three, 10-minute segments during the day.  Stop smoking. If you do smoke, contact your caregiver to discuss ways in which they can help you quit.  Do not use street drugs.  Maintain a normal weight.  Maintain a healthy blood pressure.  Keep up with your blood work for checking the fats in your blood as directed by your caregiver. Document Released: 02/04/2004 Document Revised: 10/18/2011 Document Reviewed: 09/01/2008 Exit[employer] Patient Information 2014 [employer].  

## 2013-07-05 NOTE — Progress Notes (Signed)
Pre visit review using our clinic review tool, if applicable. No additional management support is needed unless otherwise documented below in the visit note. 

## 2013-07-06 ENCOUNTER — Telehealth: Payer: Self-pay | Admitting: Family

## 2013-07-06 NOTE — Telephone Encounter (Signed)
Relevant patient education assigned to patient using Emmi. ° °

## 2013-07-08 ENCOUNTER — Telehealth: Payer: Self-pay | Admitting: Oncology

## 2013-07-08 ENCOUNTER — Telehealth: Payer: Self-pay | Admitting: Family

## 2013-07-08 ENCOUNTER — Other Ambulatory Visit: Payer: Self-pay | Admitting: Physician Assistant

## 2013-07-08 ENCOUNTER — Other Ambulatory Visit: Payer: BC Managed Care – PPO

## 2013-07-08 NOTE — Telephone Encounter (Signed)
Relevant patient education assigned to patient using Emmi. ° °

## 2013-07-08 NOTE — Telephone Encounter (Signed)
, °

## 2013-07-09 ENCOUNTER — Other Ambulatory Visit: Payer: BC Managed Care – PPO

## 2013-07-10 ENCOUNTER — Other Ambulatory Visit: Payer: Self-pay | Admitting: Family

## 2013-07-12 ENCOUNTER — Other Ambulatory Visit: Payer: Self-pay

## 2013-07-12 ENCOUNTER — Encounter: Payer: Self-pay | Admitting: Family

## 2013-07-12 MED ORDER — OMEGA-3-ACID ETHYL ESTERS 1 G PO CAPS: 1.0000 g | ORAL_CAPSULE | Freq: Every day | ORAL | Status: DC

## 2013-07-12 NOTE — Telephone Encounter (Signed)
Error

## 2013-07-13 ENCOUNTER — Encounter: Payer: Self-pay | Admitting: Interventional Cardiology

## 2013-07-15 ENCOUNTER — Ambulatory Visit (HOSPITAL_BASED_OUTPATIENT_CLINIC_OR_DEPARTMENT_OTHER): Payer: BC Managed Care – PPO | Admitting: Hematology and Oncology

## 2013-07-15 ENCOUNTER — Ambulatory Visit: Payer: BC Managed Care – PPO | Admitting: Physician Assistant

## 2013-07-15 VITALS — BP 120/87 | HR 94 | Temp 98.8°F | Resp 18 | Ht 67.0 in | Wt 124.6 lb

## 2013-07-15 DIAGNOSIS — F341 Dysthymic disorder: Secondary | ICD-10-CM

## 2013-07-15 DIAGNOSIS — C50911 Malignant neoplasm of unspecified site of right female breast: Secondary | ICD-10-CM

## 2013-07-15 DIAGNOSIS — C50019 Malignant neoplasm of nipple and areola, unspecified female breast: Secondary | ICD-10-CM

## 2013-07-15 DIAGNOSIS — Z170 Estrogen receptor positive status [ER+]: Secondary | ICD-10-CM

## 2013-07-15 NOTE — Telephone Encounter (Signed)
appts made and printed. Pt is aware that we do not schedule for a PCP. i gve her that info...td

## 2013-07-16 ENCOUNTER — Ambulatory Visit: Payer: BC Managed Care – PPO | Admitting: Physician Assistant

## 2013-07-24 ENCOUNTER — Encounter: Payer: Self-pay | Admitting: Hematology and Oncology

## 2013-07-24 ENCOUNTER — Encounter: Payer: Self-pay | Admitting: Oncology

## 2013-07-24 ENCOUNTER — Other Ambulatory Visit: Payer: Self-pay | Admitting: Hematology and Oncology
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
## 2013-08-01 ENCOUNTER — Other Ambulatory Visit: Payer: Self-pay | Admitting: Oncology

## 2013-08-10 ENCOUNTER — Encounter: Payer: Self-pay | Admitting: Family

## 2013-08-12 ENCOUNTER — Ambulatory Visit: Payer: BC Managed Care – PPO | Admitting: Interventional Cardiology

## 2013-08-27 ENCOUNTER — Encounter: Payer: Self-pay | Admitting: Family

## 2013-08-28 ENCOUNTER — Other Ambulatory Visit: Payer: Self-pay

## 2013-08-28 DIAGNOSIS — E782 Mixed hyperlipidemia: Secondary | ICD-10-CM

## 2013-09-20 ENCOUNTER — Telehealth: Payer: Self-pay | Admitting: Oncology

## 2013-09-20 NOTE — Telephone Encounter (Signed)
, °

## 2013-09-25 ENCOUNTER — Other Ambulatory Visit: Payer: BC Managed Care – PPO

## 2013-09-27 ENCOUNTER — Encounter: Payer: Self-pay | Admitting: Family

## 2013-09-27 ENCOUNTER — Other Ambulatory Visit (INDEPENDENT_AMBULATORY_CARE_PROVIDER_SITE_OTHER): Payer: BC Managed Care – PPO

## 2013-09-27 DIAGNOSIS — E782 Mixed hyperlipidemia: Secondary | ICD-10-CM

## 2013-09-27 LAB — LIPID PANEL
Cholesterol: 148 mg/dL (ref 0–200)
HDL: 42.6 mg/dL (ref >39.00)
LDL CALC: 49 mg/dL (ref 0–99)
TRIGLYCERIDES: 280 mg/dL — AB (ref 0.0–149.0)
Total CHOL/HDL Ratio: 3
VLDL: 56 mg/dL — ABNORMAL HIGH (ref 0.0–40.0)

## 2013-09-29 ENCOUNTER — Other Ambulatory Visit: Payer: Self-pay | Admitting: Family

## 2013-10-01 ENCOUNTER — Other Ambulatory Visit: Payer: BC Managed Care – PPO

## 2013-10-14 ENCOUNTER — Other Ambulatory Visit: Payer: Self-pay | Admitting: Family

## 2013-10-15 ENCOUNTER — Other Ambulatory Visit: Payer: BC Managed Care – PPO

## 2013-10-15 ENCOUNTER — Telehealth: Payer: Self-pay | Admitting: Family

## 2013-10-15 ENCOUNTER — Ambulatory Visit: Payer: BC Managed Care – PPO | Admitting: Oncology

## 2013-10-15 MED ORDER — SIMVASTATIN 20 MG PO TABS: 20.0000 mg | ORAL_TABLET | Freq: Every day | ORAL | Status: DC

## 2013-10-15 NOTE — Telephone Encounter (Signed)
Done

## 2013-10-15 NOTE — Telephone Encounter (Signed)
[place], [place] is requesting 90 day re-fill on simvastatin (ZOCOR) 20 MG tablet

## 2013-10-21 ENCOUNTER — Other Ambulatory Visit: Payer: Self-pay | Admitting: *Deleted

## 2013-10-21 DIAGNOSIS — C50911 Malignant neoplasm of unspecified site of right female breast: Secondary | ICD-10-CM

## 2013-10-22 ENCOUNTER — Telehealth: Payer: Self-pay | Admitting: Physician Assistant

## 2013-10-22 ENCOUNTER — Ambulatory Visit (HOSPITAL_BASED_OUTPATIENT_CLINIC_OR_DEPARTMENT_OTHER): Payer: BC Managed Care – PPO | Admitting: Physician Assistant

## 2013-10-22 ENCOUNTER — Encounter: Payer: Self-pay | Admitting: Physician Assistant

## 2013-10-22 ENCOUNTER — Other Ambulatory Visit (HOSPITAL_BASED_OUTPATIENT_CLINIC_OR_DEPARTMENT_OTHER): Payer: BC Managed Care – PPO

## 2013-10-22 VITALS — BP 138/90 | HR 89 | Temp 98.7°F | Resp 18 | Ht 67.0 in | Wt 126.3 lb

## 2013-10-22 DIAGNOSIS — C50911 Malignant neoplasm of unspecified site of right female breast: Secondary | ICD-10-CM

## 2013-10-22 DIAGNOSIS — C50019 Malignant neoplasm of nipple and areola, unspecified female breast: Secondary | ICD-10-CM

## 2013-10-22 DIAGNOSIS — F341 Dysthymic disorder: Secondary | ICD-10-CM

## 2013-10-22 DIAGNOSIS — Z87898 Personal history of other specified conditions: Secondary | ICD-10-CM

## 2013-10-22 DIAGNOSIS — M810 Age-related osteoporosis without current pathological fracture: Secondary | ICD-10-CM

## 2013-10-22 DIAGNOSIS — Z1231 Encounter for screening mammogram for malignant neoplasm of breast: Secondary | ICD-10-CM

## 2013-10-22 DIAGNOSIS — Z170 Estrogen receptor positive status [ER+]: Secondary | ICD-10-CM

## 2013-10-22 LAB — COMPREHENSIVE METABOLIC PANEL (CC13)
ALK PHOS: 45 U/L (ref 40–150)
ALT: 30 U/L (ref 0–55)
AST: 27 U/L (ref 5–34)
Albumin: 3.7 g/dL (ref 3.5–5.0)
Anion Gap: 9 mEq/L (ref 3–11)
BUN: 16.6 mg/dL (ref 7.0–26.0)
CO2: 29 mEq/L (ref 22–29)
Calcium: 9.6 mg/dL (ref 8.4–10.4)
Chloride: 105 mEq/L (ref 98–109)
Creatinine: 0.8 mg/dL (ref 0.6–1.1)
Glucose: 118 mg/dl (ref 70–140)
Potassium: 3.9 mEq/L (ref 3.5–5.1)
SODIUM: 142 meq/L (ref 136–145)
TOTAL PROTEIN: 6.7 g/dL (ref 6.4–8.3)
Total Bilirubin: 0.73 mg/dL (ref 0.20–1.20)

## 2013-10-22 LAB — CBC WITH DIFFERENTIAL/PLATELET
BASO%: 0.4 % (ref 0.0–2.0)
Basophils Absolute: 0 10*3/uL (ref 0.0–0.1)
EOS ABS: 0 10*3/uL (ref 0.0–0.5)
EOS%: 0.3 % (ref 0.0–7.0)
HCT: 40.1 % (ref 34.8–46.6)
HGB: 13.4 g/dL (ref 11.6–15.9)
LYMPH%: 24.9 % (ref 14.0–49.7)
MCH: 31.9 pg (ref 25.1–34.0)
MCHC: 33.3 g/dL (ref 31.5–36.0)
MCV: 95.9 fL (ref 79.5–101.0)
MONO#: 0.3 10*3/uL (ref 0.1–0.9)
MONO%: 7 % (ref 0.0–14.0)
NEUT%: 67.4 % (ref 38.4–76.8)
NEUTROS ABS: 3.2 10*3/uL (ref 1.5–6.5)
PLATELETS: 254 10*3/uL (ref 145–400)
RBC: 4.18 10*6/uL (ref 3.70–5.45)
RDW: 13.2 % (ref 11.2–14.5)
WBC: 4.8 10*3/uL (ref 3.9–10.3)
lymph#: 1.2 10*3/uL (ref 0.9–3.3)

## 2013-10-22 MED ORDER — TAMOXIFEN CITRATE 20 MG PO TABS: 20.0000 mg | ORAL_TABLET | Freq: Every day | ORAL | Status: DC

## 2013-10-22 NOTE — Telephone Encounter (Signed)
per pof to sch appt-mamma-sch mamma and gave pt copy of sch
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
## 2013-11-04 ENCOUNTER — Other Ambulatory Visit: Payer: Self-pay | Admitting: Family

## 2013-11-11 ENCOUNTER — Other Ambulatory Visit: Payer: Self-pay | Admitting: *Deleted

## 2013-11-11 ENCOUNTER — Encounter: Payer: Self-pay | Admitting: Oncology

## 2013-11-11 DIAGNOSIS — C50911 Malignant neoplasm of unspecified site of right female breast: Secondary | ICD-10-CM

## 2013-11-11 MED ORDER — TAMOXIFEN CITRATE 20 MG PO TABS: 20.0000 mg | ORAL_TABLET | Freq: Every day | ORAL | Status: DC

## 2013-11-12 ENCOUNTER — Other Ambulatory Visit: Payer: Self-pay | Admitting: *Deleted

## 2013-11-12 ENCOUNTER — Encounter: Payer: Self-pay | Admitting: *Deleted

## 2013-11-12 DIAGNOSIS — C50911 Malignant neoplasm of unspecified site of right female breast: Secondary | ICD-10-CM

## 2013-11-12 MED ORDER — VENLAFAXINE HCL ER 75 MG PO CP24: ORAL_CAPSULE | ORAL | Status: DC

## 2013-11-27 ENCOUNTER — Encounter: Payer: Self-pay | Admitting: Oncology

## 2013-11-28 ENCOUNTER — Encounter: Payer: Self-pay | Admitting: *Deleted

## 2013-11-28 NOTE — Telephone Encounter (Addendum)
Printed and taken to Dr. Jana Hakim for review.  Next scheduled f/u March 24, 2014. 11-28-2013 1736:  [facility] nurse has sent a patient message.  Awaiting response.  This nurse signing off/completeing this request.

## 2014-01-07 ENCOUNTER — Encounter: Payer: Self-pay | Admitting: Oncology

## 2014-01-07 ENCOUNTER — Other Ambulatory Visit: Payer: Self-pay | Admitting: *Deleted

## 2014-01-07 NOTE — Progress Notes (Signed)
Copy of current Venlafaxine ER order and message to provider for review.

## 2014-01-09 ENCOUNTER — Telehealth: Payer: Self-pay | Admitting: *Deleted

## 2014-01-09 NOTE — Telephone Encounter (Addendum)
Printed and to provider for review on 01-07-2014.  See chart notes.  [facility] has called patient.

## 2014-01-09 NOTE — Telephone Encounter (Signed)
This RN spoke with patient regarding message she wrote on MyChart about increasing Effexor. Patient stated,"I only need to increase the drug for about a month, then go back to normal dose." After speaking with patient this morning, she has decided not to increase the Effexor. I instructed patient that if she feels to increase it or has other concerns to call the office. Patient verbalized understanding.

## 2014-01-15 ENCOUNTER — Other Ambulatory Visit: Payer: Self-pay | Admitting: Family

## 2014-01-27 ENCOUNTER — Other Ambulatory Visit: Payer: Self-pay | Admitting: Gynecology

## 2014-01-28 LAB — CYTOLOGY - PAP

## 2014-03-17 ENCOUNTER — Other Ambulatory Visit (HOSPITAL_BASED_OUTPATIENT_CLINIC_OR_DEPARTMENT_OTHER): Payer: Medicare Other

## 2014-03-17 DIAGNOSIS — C50911 Malignant neoplasm of unspecified site of right female breast: Secondary | ICD-10-CM

## 2014-03-17 DIAGNOSIS — C50011 Malignant neoplasm of nipple and areola, right female breast: Secondary | ICD-10-CM

## 2014-03-17 LAB — COMPREHENSIVE METABOLIC PANEL (CC13)
ALK PHOS: 54 U/L (ref 40–150)
ALT: 29 U/L (ref 0–55)
AST: 27 U/L (ref 5–34)
Albumin: 3.9 g/dL (ref 3.5–5.0)
Anion Gap: 15 mEq/L — ABNORMAL HIGH (ref 3–11)
BILIRUBIN TOTAL: 0.41 mg/dL (ref 0.20–1.20)
BUN: 13.6 mg/dL (ref 7.0–26.0)
CO2: 25 mEq/L (ref 22–29)
Calcium: 8.9 mg/dL (ref 8.4–10.4)
Chloride: 105 mEq/L (ref 98–109)
Creatinine: 0.7 mg/dL (ref 0.6–1.1)
Glucose: 101 mg/dl (ref 70–140)
Potassium: 3.5 mEq/L (ref 3.5–5.1)
SODIUM: 144 meq/L (ref 136–145)
TOTAL PROTEIN: 6.9 g/dL (ref 6.4–8.3)

## 2014-03-17 LAB — CBC WITH DIFFERENTIAL/PLATELET
BASO%: 0.2 % (ref 0.0–2.0)
Basophils Absolute: 0 10*3/uL (ref 0.0–0.1)
EOS%: 0.2 % (ref 0.0–7.0)
Eosinophils Absolute: 0 10*3/uL (ref 0.0–0.5)
HCT: 39.3 % (ref 34.8–46.6)
HGB: 13.3 g/dL (ref 11.6–15.9)
LYMPH%: 22.7 % (ref 14.0–49.7)
MCH: 31.9 pg (ref 25.1–34.0)
MCHC: 33.8 g/dL (ref 31.5–36.0)
MCV: 94.2 fL (ref 79.5–101.0)
MONO#: 0.4 10*3/uL (ref 0.1–0.9)
MONO%: 6.4 % (ref 0.0–14.0)
NEUT%: 70.5 % (ref 38.4–76.8)
NEUTROS ABS: 4.2 10*3/uL (ref 1.5–6.5)
Platelets: 248 10*3/uL (ref 145–400)
RBC: 4.17 10*6/uL (ref 3.70–5.45)
RDW: 13.7 % (ref 11.2–14.5)
WBC: 5.9 10*3/uL (ref 3.9–10.3)
lymph#: 1.3 10*3/uL (ref 0.9–3.3)

## 2014-03-18 ENCOUNTER — Ambulatory Visit
Admission: RE | Admit: 2014-03-18 | Discharge: 2014-03-18 | Disposition: A | Discharge status: Home / Self Care | Payer: Medicare Other | Source: Ambulatory Visit | Attending: Oncology | Admitting: Oncology

## 2014-03-18 DIAGNOSIS — Z1231 Encounter for screening mammogram for malignant neoplasm of breast: Secondary | ICD-10-CM

## 2014-03-24 ENCOUNTER — Telehealth: Payer: Self-pay | Admitting: Oncology

## 2014-03-24 ENCOUNTER — Other Ambulatory Visit: Payer: Self-pay | Admitting: *Deleted

## 2014-03-24 ENCOUNTER — Other Ambulatory Visit: Payer: Self-pay | Admitting: Oncology

## 2014-03-24 ENCOUNTER — Ambulatory Visit (HOSPITAL_BASED_OUTPATIENT_CLINIC_OR_DEPARTMENT_OTHER): Payer: Medicare Other | Admitting: Oncology

## 2014-03-24 VITALS — BP 130/93 | HR 98 | Temp 98.6°F | Resp 18 | Ht 67.0 in | Wt 123.9 lb

## 2014-03-24 DIAGNOSIS — Z170 Estrogen receptor positive status [ER+]: Secondary | ICD-10-CM

## 2014-03-24 DIAGNOSIS — C50911 Malignant neoplasm of unspecified site of right female breast: Secondary | ICD-10-CM

## 2014-03-24 DIAGNOSIS — M810 Age-related osteoporosis without current pathological fracture: Secondary | ICD-10-CM

## 2014-03-24 MED ORDER — TAMOXIFEN CITRATE 20 MG PO TABS: 20.0000 mg | ORAL_TABLET | Freq: Every day | ORAL | Status: DC

## 2014-03-24 MED ORDER — VENLAFAXINE HCL 37.5 MG PO TABS: 37.5000 mg | ORAL_TABLET | Freq: Two times a day (BID) | ORAL | Status: DC
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
## 2014-03-24 NOTE — Addendum Note (Signed)
Addended by: Laureen Abrahams on: 03/24/2014 06:47 PM   Modules accepted: Medications

## 2014-03-24 NOTE — Telephone Encounter (Signed)
, °

## 2014-04-10 ENCOUNTER — Other Ambulatory Visit: Payer: Self-pay | Admitting: Family

## 2014-04-16 ENCOUNTER — Other Ambulatory Visit: Payer: Self-pay | Admitting: Family

## 2014-04-18 ENCOUNTER — Encounter: Payer: Self-pay | Admitting: Oncology

## 2014-04-23 ENCOUNTER — Encounter: Payer: Self-pay | Admitting: *Deleted

## 2014-06-20 NOTE — Telephone Encounter (Signed)
none

## 2014-06-20 NOTE — Telephone Encounter (Signed)
No entry 

## 2014-07-05 ENCOUNTER — Other Ambulatory Visit: Payer: Self-pay | Admitting: Family

## 2014-08-04 ENCOUNTER — Other Ambulatory Visit: Payer: Self-pay | Admitting: Family

## 2014-08-29 ENCOUNTER — Other Ambulatory Visit: Payer: Self-pay | Admitting: *Deleted

## 2014-08-29 DIAGNOSIS — C50911 Malignant neoplasm of unspecified site of right female breast: Secondary | ICD-10-CM

## 2014-09-01 ENCOUNTER — Ambulatory Visit
Admission: RE | Admit: 2014-09-01 | Discharge: 2014-09-01 | Disposition: A | Discharge status: Home / Self Care | Payer: Medicare Other | Source: Ambulatory Visit | Attending: Oncology | Admitting: Oncology

## 2014-09-01 ENCOUNTER — Other Ambulatory Visit: Payer: Self-pay | Admitting: Family

## 2014-09-01 ENCOUNTER — Other Ambulatory Visit (HOSPITAL_BASED_OUTPATIENT_CLINIC_OR_DEPARTMENT_OTHER): Payer: Medicare Other

## 2014-09-01 DIAGNOSIS — C50911 Malignant neoplasm of unspecified site of right female breast: Secondary | ICD-10-CM

## 2014-09-01 DIAGNOSIS — C50011 Malignant neoplasm of nipple and areola, right female breast: Secondary | ICD-10-CM

## 2014-09-01 LAB — CBC WITH DIFFERENTIAL/PLATELET
BASO%: 0.3 % (ref 0.0–2.0)
BASOS ABS: 0 10*3/uL (ref 0.0–0.1)
EOS%: 0.2 % (ref 0.0–7.0)
Eosinophils Absolute: 0 10*3/uL (ref 0.0–0.5)
HCT: 40.5 % (ref 34.8–46.6)
HGB: 13.3 g/dL (ref 11.6–15.9)
LYMPH%: 15.1 % (ref 14.0–49.7)
MCH: 31 pg (ref 25.1–34.0)
MCHC: 32.9 g/dL (ref 31.5–36.0)
MCV: 94.1 fL (ref 79.5–101.0)
MONO#: 0.3 10*3/uL (ref 0.1–0.9)
MONO%: 3.7 % (ref 0.0–14.0)
NEUT#: 5.9 10*3/uL (ref 1.5–6.5)
NEUT%: 80.7 % — ABNORMAL HIGH (ref 38.4–76.8)
PLATELETS: 254 10*3/uL (ref 145–400)
RBC: 4.3 10*6/uL (ref 3.70–5.45)
RDW: 13.2 % (ref 11.2–14.5)
WBC: 7.3 10*3/uL (ref 3.9–10.3)
lymph#: 1.1 10*3/uL (ref 0.9–3.3)

## 2014-09-01 LAB — COMPREHENSIVE METABOLIC PANEL (CC13)
ALK PHOS: 46 U/L (ref 40–150)
ALT: 21 U/L (ref 0–55)
ANION GAP: 8 meq/L (ref 3–11)
AST: 20 U/L (ref 5–34)
Albumin: 3.8 g/dL (ref 3.5–5.0)
BILIRUBIN TOTAL: 1.02 mg/dL (ref 0.20–1.20)
BUN: 22.3 mg/dL (ref 7.0–26.0)
CHLORIDE: 108 meq/L (ref 98–109)
CO2: 26 meq/L (ref 22–29)
Calcium: 9.2 mg/dL (ref 8.4–10.4)
Creatinine: 0.6 mg/dL (ref 0.6–1.1)
EGFR: >90 mL/min/{1.73_m2} (ref >90)
Glucose: 99 mg/dl (ref 70–140)
Potassium: 3.8 mEq/L (ref 3.5–5.1)
Sodium: 142 mEq/L (ref 136–145)
Total Protein: 6.7 g/dL (ref 6.4–8.3)

## 2014-09-08 ENCOUNTER — Telehealth: Payer: Self-pay | Admitting: Oncology

## 2014-09-08 ENCOUNTER — Ambulatory Visit (HOSPITAL_BASED_OUTPATIENT_CLINIC_OR_DEPARTMENT_OTHER): Payer: Medicare Other | Admitting: Oncology

## 2014-09-08 VITALS — BP 134/90 | HR 89 | Temp 98.6°F | Resp 18 | Ht 67.0 in | Wt 127.5 lb

## 2014-09-08 DIAGNOSIS — M810 Age-related osteoporosis without current pathological fracture: Secondary | ICD-10-CM | POA: Diagnosis not present

## 2014-09-08 DIAGNOSIS — C50911 Malignant neoplasm of unspecified site of right female breast: Secondary | ICD-10-CM
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
## 2014-09-08 NOTE — Telephone Encounter (Signed)
Appointments made and avs printed for patient °

## 2014-09-24 ENCOUNTER — Other Ambulatory Visit: Payer: Self-pay | Admitting: Family

## 2014-10-16 ENCOUNTER — Telehealth: Payer: Self-pay | Admitting: Interventional Cardiology

## 2014-10-16 NOTE — Telephone Encounter (Signed)
New Message      Pt wants to have an EKG or Electrocardiogram preformed the same day she can see doctor.  Pt was unsure of the one that she needed. There is no order to make appt from. She would like RN to call her back.

## 2014-10-20 ENCOUNTER — Other Ambulatory Visit: Payer: Self-pay | Admitting: Family

## 2014-10-27 ENCOUNTER — Other Ambulatory Visit: Payer: Self-pay

## 2014-11-03 ENCOUNTER — Encounter: Payer: Self-pay | Admitting: Oncology

## 2014-11-17 ENCOUNTER — Other Ambulatory Visit: Payer: Self-pay | Admitting: Family

## 2014-11-27 NOTE — Telephone Encounter (Signed)
Mailed form for cancer retreat due to failure to process per given fax number.  Sent to Silverio Decamp at [place] [address]

## 2015-01-01 ENCOUNTER — Ambulatory Visit (INDEPENDENT_AMBULATORY_CARE_PROVIDER_SITE_OTHER): Payer: Medicare Other | Admitting: Interventional Cardiology

## 2015-01-01 ENCOUNTER — Encounter: Payer: Self-pay | Admitting: Interventional Cardiology

## 2015-01-01 VITALS — BP 110/82 | HR 85 | Ht 67.0 in | Wt 125.8 lb

## 2015-01-01 DIAGNOSIS — C50911 Malignant neoplasm of unspecified site of right female breast: Secondary | ICD-10-CM | POA: Diagnosis not present

## 2015-01-01 DIAGNOSIS — F419 Anxiety disorder, unspecified: Secondary | ICD-10-CM | POA: Diagnosis not present

## 2015-01-01 DIAGNOSIS — I718 Aortic aneurysm of unspecified site, ruptured: Secondary | ICD-10-CM

## 2015-01-01 DIAGNOSIS — I10 Essential (primary) hypertension: Secondary | ICD-10-CM

## 2015-01-01 LAB — BASIC METABOLIC PANEL
BUN: 20 mg/dL (ref 6–23)
CALCIUM: 9.6 mg/dL (ref 8.4–10.5)
CHLORIDE: 102 meq/L (ref 96–112)
CO2: 27 meq/L (ref 19–32)
Creatinine, Ser: 0.64 mg/dL (ref 0.40–1.20)
GFR: 98.66 mL/min (ref >60.00)
GLUCOSE: 97 mg/dL (ref 70–99)
POTASSIUM: 4.1 meq/L (ref 3.5–5.1)
SODIUM: 140 meq/L (ref 135–145)

## 2015-01-01 NOTE — Patient Instructions (Signed)
Medication Instructions:  Your physician recommends that you continue on your current medications as directed. Please refer to the Current Medication list given to you today.   Labwork: Bmet today  Testing/Procedures: Non-Cardiac CT Angiography (CTA), is a special type of CT scan that uses a computer to produce multi-dimensional views of major blood vessels throughout the body. In CT angiography, a contrast material is injected through an IV to help visualize the blood vessels   Follow-Up: Your physician wants you to follow-up in: 1 year with Dr.Smith You will receive a reminder letter in the mail two months in advance. If you don't receive a letter, please call our office to schedule the follow-up appointment.   Any Other Special Instructions Will Be Listed Below (If Applicable).
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
## 2015-01-02 ENCOUNTER — Telehealth: Payer: Self-pay

## 2015-01-02 ENCOUNTER — Ambulatory Visit (INDEPENDENT_AMBULATORY_CARE_PROVIDER_SITE_OTHER)
Admission: RE | Admit: 2015-01-02 | Discharge: 2015-01-02 | Disposition: A | Discharge status: Home / Self Care | Payer: Medicare Other | Source: Ambulatory Visit | Attending: Interventional Cardiology | Admitting: Interventional Cardiology

## 2015-01-02 DIAGNOSIS — I2510 Atherosclerotic heart disease of native coronary artery without angina pectoris: Secondary | ICD-10-CM

## 2015-01-02 DIAGNOSIS — I2584 Coronary atherosclerosis due to calcified coronary lesion: Principal | ICD-10-CM

## 2015-01-02 DIAGNOSIS — I718 Aortic aneurysm of unspecified site, ruptured: Secondary | ICD-10-CM

## 2015-01-02 MED ORDER — IOHEXOL 350 MG/ML SOLN
100.0000 mL | Freq: Once | INTRAVENOUS | Status: AC | PRN
  Administered 2015-01-02: 80 mL via INTRAVENOUS

## 2015-01-02 NOTE — Telephone Encounter (Signed)
-----   Message from Belva Crome, MD sent at 01/02/2015  9:33 AM EDT ----- The CT scan reveals 2 findings: 1. Stable ascending aortic aneurysm. Current measurement is 4.0 cm. Measurement and 2013 was 3.9 cm. This is not a significant change and reveals no significant progression. 2. Scattered coronary calcification that by definition represents plaque buildup in the arteries on the heart. She needs to have a stress or pharmacologic myocardial perfusion study to make sure no significant obstruction is present.  I need to see her back in one year unless some abnormality is noted on the perfusion study

## 2015-01-02 NOTE — Telephone Encounter (Signed)
Called to give pt CT results and Dr.Smith.lmtcb

## 2015-01-02 NOTE — Telephone Encounter (Signed)
Pt aware of CT results and Dr.Smith recommendations. The CT scan reveals 2 findings: 1. Stable ascending aortic aneurysm. Current measurement is 4.0 cm. Measurement and 2013 was 3.9 cm. This is not a significant change and reveals no significant progression. 2. Scattered coronary calcification that by definition represents plaque buildup in the arteries on the heart. She needs to have a stress or pharmacologic myocardial perfusion study to make sure no significant obstruction is present.  I need to see her back in one year unless some abnormality is noted on the perfusion study    Pt agreeable with plan. She feel she is able to walk on the treadmill. Stress myoview ordered. Pt given verbal pre-test instructions. Pt adv to hold Metoprolol the morning of test. Adv pt a scheduler from our office will call her to scheduled. Pt verbalized understanding.

## 2015-01-14 ENCOUNTER — Other Ambulatory Visit: Payer: Medicare Other

## 2015-01-14 ENCOUNTER — Telehealth (HOSPITAL_COMMUNITY): Payer: Self-pay

## 2015-01-14 NOTE — Telephone Encounter (Signed)
Encounter complete. 

## 2015-01-15 ENCOUNTER — Telehealth: Payer: Self-pay | Admitting: Interventional Cardiology

## 2015-01-15 ENCOUNTER — Telehealth (HOSPITAL_COMMUNITY): Payer: Self-pay

## 2015-01-15 NOTE — Telephone Encounter (Signed)
New Message  Pt calling to speak w/ RN to discuss why Lexiscan was ordered and not regular stress test. Please call back and discuss.

## 2015-01-15 NOTE — Telephone Encounter (Signed)
Awaiting return call

## 2015-01-15 NOTE — Telephone Encounter (Signed)
Returned pt call.pt myoview is scheduled for 9/16. Adv her that her Myoview was ordered with exercise not lexi. Adv pt that will talk with the nuclear dept to make sure that she going to exercise. Pt voiced appreciation and verbalized understanding.  Spoke with [employer]. myoview was orderedas exercise. She has corrected the notes reflecting lexi

## 2015-01-16 ENCOUNTER — Ambulatory Visit (HOSPITAL_COMMUNITY)
Admission: RE | Admit: 2015-01-16 | Discharge: 2015-01-16 | Disposition: A | Discharge status: Home / Self Care | Payer: Medicare Other | Source: Ambulatory Visit | Attending: Cardiovascular Disease | Admitting: Cardiovascular Disease

## 2015-01-16 ENCOUNTER — Encounter (HOSPITAL_COMMUNITY): Payer: Self-pay | Admitting: *Deleted

## 2015-01-16 DIAGNOSIS — I2510 Atherosclerotic heart disease of native coronary artery without angina pectoris: Secondary | ICD-10-CM | POA: Diagnosis not present

## 2015-01-16 DIAGNOSIS — E785 Hyperlipidemia, unspecified: Secondary | ICD-10-CM | POA: Insufficient documentation

## 2015-01-16 DIAGNOSIS — Z8249 Family history of ischemic heart disease and other diseases of the circulatory system: Secondary | ICD-10-CM | POA: Insufficient documentation

## 2015-01-16 DIAGNOSIS — I10 Essential (primary) hypertension: Secondary | ICD-10-CM | POA: Diagnosis not present

## 2015-01-16 DIAGNOSIS — I2584 Coronary atherosclerosis due to calcified coronary lesion: Secondary | ICD-10-CM

## 2015-01-16 MED ORDER — TECHNETIUM TC 99M SESTAMIBI GENERIC - CARDIOLITE
31.8000 | Freq: Once | INTRAVENOUS | Status: AC | PRN
  Administered 2015-01-16: 31.8 via INTRAVENOUS

## 2015-01-16 MED ORDER — TECHNETIUM TC 99M SESTAMIBI GENERIC - CARDIOLITE
10.6000 | Freq: Once | INTRAVENOUS | Status: AC | PRN
  Administered 2015-01-16: 10.6 via INTRAVENOUS

## 2015-01-16 NOTE — Progress Notes (Signed)
Dr Claiborne Billings reviewed Cardiolite study and ok to discharge pt home. Delight Hoh A

## 2015-01-20 ENCOUNTER — Telehealth: Payer: Self-pay

## 2015-01-20 LAB — MYOCARDIAL PERFUSION IMAGING
CHL CUP MPHR: 154 {beats}/min
CHL CUP NUCLEAR SDS: 0
CHL CUP NUCLEAR SRS: 3
CHL CUP NUCLEAR SSS: 3
CHL CUP STRESS STAGE 1 DBP: 91 mmHg
CHL CUP STRESS STAGE 1 SBP: 110 mmHg
CHL CUP STRESS STAGE 1 SPEED: 0 mph
CHL CUP STRESS STAGE 2 HR: 90 {beats}/min
CHL CUP STRESS STAGE 3 HR: 90 {beats}/min
CHL CUP STRESS STAGE 4 HR: 114 {beats}/min
CHL CUP STRESS STAGE 4 SBP: 133 mmHg
CHL CUP STRESS STAGE 4 SPEED: 1.7 mph
CHL CUP STRESS STAGE 5 DBP: 85 mmHg
CHL CUP STRESS STAGE 5 GRADE: 12 %
CHL CUP STRESS STAGE 5 HR: 144 {beats}/min
CHL CUP STRESS STAGE 6 DBP: 85 mmHg
CHL CUP STRESS STAGE 6 HR: 162 {beats}/min
CHL CUP STRESS STAGE 6 SBP: 143 mmHg
CHL CUP STRESS STAGE 7 GRADE: 0 %
CHL CUP STRESS STAGE 7 HR: 142 {beats}/min
CHL CUP STRESS STAGE 7 SBP: 139 mmHg
CHL CUP STRESS STAGE 7 SPEED: 0 mph
CHL CUP STRESS STAGE 8 DBP: 88 mmHg
CHL CUP STRESS STAGE 8 HR: 98 {beats}/min
CHL CUP STRESS STAGE 8 SBP: 128 mmHg
CSEPHR: 105 %
CSEPPBP: 143 mmHg
Estimated workload: 10.1 METS
Exercise duration (min): 8 min
Exercise duration (sec): 40 s
LV dias vol: 39 mL
LVSYSVOL: 8 mL
Peak HR: 162 {beats}/min
Percent of predicted max HR: 105 %
RPE: 16
Rest HR: 64 {beats}/min
Stage 1 Grade: 0 %
Stage 1 HR: 90 {beats}/min
Stage 2 Grade: 0 %
Stage 2 Speed: 1 mph
Stage 3 Grade: 0.1 %
Stage 3 Speed: 1 mph
Stage 4 DBP: 84 mmHg
Stage 4 Grade: 10 %
Stage 5 SBP: 135 mmHg
Stage 5 Speed: 2.5 mph
Stage 6 Grade: 14 %
Stage 6 Speed: 3.4 mph
Stage 7 DBP: 81 mmHg
Stage 8 Grade: 0 %
Stage 8 Speed: 0 mph
TID: 0.86

## 2015-01-20 NOTE — Telephone Encounter (Signed)
-----   Message from Belva Crome, MD sent at 01/20/2015  2:21 PM EDT ----- Normal study. No worries.

## 2015-01-20 NOTE — Telephone Encounter (Signed)
Pt aware of myoview results. Normal study. No worries. Pt verbalized understanding.

## 2015-01-20 NOTE — Telephone Encounter (Signed)
Called to give to give pt myoview results. lmtcb

## 2015-01-20 NOTE — Telephone Encounter (Signed)
Follow up ° ° °Pt returning your call °

## 2015-02-09 ENCOUNTER — Other Ambulatory Visit: Payer: Self-pay

## 2015-02-09 DIAGNOSIS — Z1231 Encounter for screening mammogram for malignant neoplasm of breast: Secondary | ICD-10-CM

## 2015-02-19 ENCOUNTER — Telehealth: Payer: Self-pay | Admitting: Oncology

## 2015-02-19 NOTE — Telephone Encounter (Signed)
Patient called in to reschedule her appointments to after her mammo

## 2015-03-04 ENCOUNTER — Ambulatory Visit: Payer: Medicare Other | Admitting: Nurse Practitioner

## 2015-03-04 ENCOUNTER — Other Ambulatory Visit: Payer: Medicare Other

## 2015-03-07 ENCOUNTER — Other Ambulatory Visit: Payer: Self-pay | Admitting: Oncology

## 2015-03-09 ENCOUNTER — Telehealth: Payer: Self-pay | Admitting: *Deleted

## 2015-03-09 NOTE — Telephone Encounter (Signed)
"  I need to reschedule my F/U with Dr. Jana Hakim.  I need an earlier appointment date."  Call transferred to [facility] scheduling.

## 2015-03-10 ENCOUNTER — Telehealth: Payer: Self-pay | Admitting: Oncology

## 2015-03-10 ENCOUNTER — Other Ambulatory Visit: Payer: Self-pay | Admitting: *Deleted

## 2015-03-10 DIAGNOSIS — C50911 Malignant neoplasm of unspecified site of right female breast: Secondary | ICD-10-CM

## 2015-03-10 MED ORDER — TAMOXIFEN CITRATE 20 MG PO TABS: 20.0000 mg | ORAL_TABLET | Freq: Every day | ORAL | Status: DC

## 2015-03-10 NOTE — Telephone Encounter (Signed)
Returned patient call re r/s 12/9 appointments. Gave patient new appointments for 12/14.

## 2015-03-20 ENCOUNTER — Ambulatory Visit
Admission: RE | Admit: 2015-03-20 | Discharge: 2015-03-20 | Disposition: A | Discharge status: Home / Self Care | Payer: Medicare Other | Source: Ambulatory Visit

## 2015-03-20 ENCOUNTER — Other Ambulatory Visit: Payer: Self-pay

## 2015-03-20 DIAGNOSIS — Z1231 Encounter for screening mammogram for malignant neoplasm of breast: Secondary | ICD-10-CM

## 2015-03-23 ENCOUNTER — Other Ambulatory Visit: Payer: Self-pay | Admitting: Family

## 2015-04-10 ENCOUNTER — Other Ambulatory Visit: Payer: Medicare Other

## 2015-04-10 ENCOUNTER — Ambulatory Visit: Payer: Medicare Other | Admitting: Nurse Practitioner

## 2015-04-15 ENCOUNTER — Encounter: Payer: Self-pay | Admitting: Nurse Practitioner

## 2015-04-15 ENCOUNTER — Other Ambulatory Visit (HOSPITAL_BASED_OUTPATIENT_CLINIC_OR_DEPARTMENT_OTHER): Payer: Medicare Other

## 2015-04-15 ENCOUNTER — Ambulatory Visit (HOSPITAL_BASED_OUTPATIENT_CLINIC_OR_DEPARTMENT_OTHER): Payer: Medicare Other | Admitting: Nurse Practitioner

## 2015-04-15 ENCOUNTER — Telehealth: Payer: Self-pay | Admitting: Oncology

## 2015-04-15 VITALS — BP 115/77 | HR 89 | Temp 98.1°F | Resp 18 | Ht 67.0 in | Wt 122.2 lb

## 2015-04-15 DIAGNOSIS — M810 Age-related osteoporosis without current pathological fracture: Secondary | ICD-10-CM

## 2015-04-15 DIAGNOSIS — C50911 Malignant neoplasm of unspecified site of right female breast: Secondary | ICD-10-CM | POA: Diagnosis not present

## 2015-04-15 DIAGNOSIS — Z170 Estrogen receptor positive status [ER+]: Secondary | ICD-10-CM | POA: Insufficient documentation

## 2015-04-15 DIAGNOSIS — C50211 Malignant neoplasm of upper-inner quadrant of right female breast: Secondary | ICD-10-CM | POA: Insufficient documentation

## 2015-04-15 DIAGNOSIS — Z853 Personal history of malignant neoplasm of breast: Secondary | ICD-10-CM | POA: Diagnosis not present

## 2015-04-15 DIAGNOSIS — C50011 Malignant neoplasm of nipple and areola, right female breast: Secondary | ICD-10-CM

## 2015-04-15 LAB — COMPREHENSIVE METABOLIC PANEL
ALBUMIN: 3.9 g/dL (ref 3.5–5.0)
ALK PHOS: 41 U/L (ref 40–150)
ALT: 21 U/L (ref 0–55)
AST: 20 U/L (ref 5–34)
Anion Gap: 12 mEq/L — ABNORMAL HIGH (ref 3–11)
BUN: 21.2 mg/dL (ref 7.0–26.0)
CHLORIDE: 105 meq/L (ref 98–109)
CO2: 24 meq/L (ref 22–29)
Calcium: 9.5 mg/dL (ref 8.4–10.4)
Creatinine: 0.8 mg/dL (ref 0.6–1.1)
EGFR: 79 mL/min/{1.73_m2} — AB (ref >90)
GLUCOSE: 114 mg/dL (ref 70–140)
POTASSIUM: 3.9 meq/L (ref 3.5–5.1)
SODIUM: 141 meq/L (ref 136–145)
Total Bilirubin: 1.17 mg/dL (ref 0.20–1.20)
Total Protein: 7.1 g/dL (ref 6.4–8.3)

## 2015-04-15 LAB — CBC WITH DIFFERENTIAL/PLATELET
BASO%: 0.2 % (ref 0.0–2.0)
BASOS ABS: 0 10*3/uL (ref 0.0–0.1)
EOS ABS: 0 10*3/uL (ref 0.0–0.5)
EOS%: 0.2 % (ref 0.0–7.0)
HCT: 39.8 % (ref 34.8–46.6)
HGB: 13.4 g/dL (ref 11.6–15.9)
LYMPH%: 23.6 % (ref 14.0–49.7)
MCH: 31.5 pg (ref 25.1–34.0)
MCHC: 33.7 g/dL (ref 31.5–36.0)
MCV: 93.6 fL (ref 79.5–101.0)
MONO#: 0.4 10*3/uL (ref 0.1–0.9)
MONO%: 6 % (ref 0.0–14.0)
NEUT#: 4.1 10*3/uL (ref 1.5–6.5)
NEUT%: 70 % (ref 38.4–76.8)
Platelets: 219 10*3/uL (ref 145–400)
RBC: 4.25 10*6/uL (ref 3.70–5.45)
RDW: 13.2 % (ref 11.2–14.5)
WBC: 5.8 10*3/uL (ref 3.9–10.3)
lymph#: 1.4 10*3/uL (ref 0.9–3.3)

## 2015-04-15 NOTE — Telephone Encounter (Signed)
GAVE PATIENT AVS REPORT AND APPOINTMENTS FOR June.  °
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
## 2015-04-16 LAB — VITAMIN D 25 HYDROXY (VIT D DEFICIENCY, FRACTURES): Vit D, 25-Hydroxy: 50 ng/mL (ref 30–100)

## 2015-07-17 ENCOUNTER — Ambulatory Visit: Payer: Medicare Other | Admitting: Allergy and Immunology

## 2015-09-07 ENCOUNTER — Other Ambulatory Visit: Payer: Medicare Other

## 2015-09-07 ENCOUNTER — Ambulatory Visit: Payer: Medicare Other | Admitting: Oncology

## 2015-10-07 ENCOUNTER — Ambulatory Visit (HOSPITAL_BASED_OUTPATIENT_CLINIC_OR_DEPARTMENT_OTHER): Payer: Medicare Other | Admitting: Oncology

## 2015-10-07 ENCOUNTER — Telehealth: Payer: Self-pay | Admitting: Oncology

## 2015-10-07 ENCOUNTER — Other Ambulatory Visit (HOSPITAL_BASED_OUTPATIENT_CLINIC_OR_DEPARTMENT_OTHER): Payer: Medicare Other

## 2015-10-07 ENCOUNTER — Other Ambulatory Visit: Payer: Self-pay | Admitting: *Deleted

## 2015-10-07 VITALS — BP 118/81 | HR 76 | Temp 97.8°F | Resp 18 | Ht 67.0 in | Wt 124.2 lb

## 2015-10-07 DIAGNOSIS — Z853 Personal history of malignant neoplasm of breast: Secondary | ICD-10-CM

## 2015-10-07 DIAGNOSIS — M810 Age-related osteoporosis without current pathological fracture: Secondary | ICD-10-CM

## 2015-10-07 DIAGNOSIS — C50911 Malignant neoplasm of unspecified site of right female breast: Secondary | ICD-10-CM

## 2015-10-07 DIAGNOSIS — C50211 Malignant neoplasm of upper-inner quadrant of right female breast: Secondary | ICD-10-CM

## 2015-10-07 LAB — CBC WITH DIFFERENTIAL/PLATELET
BASO%: 0.6 % (ref 0.0–2.0)
Basophils Absolute: 0 10*3/uL (ref 0.0–0.1)
EOS%: 0.3 % (ref 0.0–7.0)
Eosinophils Absolute: 0 10*3/uL (ref 0.0–0.5)
HCT: 38.4 % (ref 34.8–46.6)
HGB: 12.9 g/dL (ref 11.6–15.9)
LYMPH%: 21 % (ref 14.0–49.7)
MCH: 31.3 pg (ref 25.1–34.0)
MCHC: 33.5 g/dL (ref 31.5–36.0)
MCV: 93.3 fL (ref 79.5–101.0)
MONO#: 0.4 10*3/uL (ref 0.1–0.9)
MONO%: 6.1 % (ref 0.0–14.0)
NEUT%: 72 % (ref 38.4–76.8)
NEUTROS ABS: 4.2 10*3/uL (ref 1.5–6.5)
Platelets: 235 10*3/uL (ref 145–400)
RBC: 4.12 10*6/uL (ref 3.70–5.45)
RDW: 13.4 % (ref 11.2–14.5)
WBC: 5.9 10*3/uL (ref 3.9–10.3)
lymph#: 1.2 10*3/uL (ref 0.9–3.3)

## 2015-10-07 LAB — COMPREHENSIVE METABOLIC PANEL
ALT: 16 U/L (ref 0–55)
AST: 19 U/L (ref 5–34)
Albumin: 3.6 g/dL (ref 3.5–5.0)
Alkaline Phosphatase: 46 U/L (ref 40–150)
Anion Gap: 9 mEq/L (ref 3–11)
BILIRUBIN TOTAL: 0.56 mg/dL (ref 0.20–1.20)
BUN: 25.8 mg/dL (ref 7.0–26.0)
CO2: 26 meq/L (ref 22–29)
CREATININE: 0.7 mg/dL (ref 0.6–1.1)
Calcium: 9.4 mg/dL (ref 8.4–10.4)
Chloride: 106 mEq/L (ref 98–109)
EGFR: 87 mL/min/{1.73_m2} — AB (ref >90)
GLUCOSE: 86 mg/dL (ref 70–140)
Potassium: 4.4 mEq/L (ref 3.5–5.1)
SODIUM: 141 meq/L (ref 136–145)
TOTAL PROTEIN: 7 g/dL (ref 6.4–8.3)
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
## 2015-10-07 NOTE — Telephone Encounter (Signed)
appt made and avs printed °

## 2016-01-25 ENCOUNTER — Other Ambulatory Visit: Payer: Self-pay | Admitting: Oncology

## 2016-01-25 DIAGNOSIS — Z1231 Encounter for screening mammogram for malignant neoplasm of breast: Secondary | ICD-10-CM

## 2016-03-17 ENCOUNTER — Encounter: Payer: Self-pay | Admitting: *Deleted

## 2016-03-28 ENCOUNTER — Ambulatory Visit
Admission: RE | Admit: 2016-03-28 | Discharge: 2016-03-28 | Disposition: A | Discharge status: Home / Self Care | Payer: Medicare Other | Source: Ambulatory Visit | Attending: Oncology | Admitting: Oncology

## 2016-03-28 ENCOUNTER — Ambulatory Visit (INDEPENDENT_AMBULATORY_CARE_PROVIDER_SITE_OTHER): Payer: Medicare Other | Admitting: Interventional Cardiology

## 2016-03-28 ENCOUNTER — Other Ambulatory Visit: Payer: Self-pay | Admitting: Oncology

## 2016-03-28 ENCOUNTER — Encounter: Payer: Self-pay | Admitting: Interventional Cardiology

## 2016-03-28 VITALS — BP 110/76 | HR 81 | Ht 67.0 in | Wt 123.8 lb

## 2016-03-28 DIAGNOSIS — Z1231 Encounter for screening mammogram for malignant neoplasm of breast: Secondary | ICD-10-CM

## 2016-03-28 DIAGNOSIS — I712 Thoracic aortic aneurysm, without rupture: Secondary | ICD-10-CM | POA: Diagnosis not present

## 2016-03-28 DIAGNOSIS — C50211 Malignant neoplasm of upper-inner quadrant of right female breast: Secondary | ICD-10-CM

## 2016-03-28 DIAGNOSIS — I10 Essential (primary) hypertension: Secondary | ICD-10-CM | POA: Diagnosis not present

## 2016-03-28 DIAGNOSIS — I7121 Aneurysm of the ascending aorta, without rupture: Secondary | ICD-10-CM | POA: Insufficient documentation

## 2016-03-28 DIAGNOSIS — C50911 Malignant neoplasm of unspecified site of right female breast: Secondary | ICD-10-CM

## 2016-03-28 MED ORDER — HYDROCHLOROTHIAZIDE 25 MG PO TABS: 12.5000 mg | ORAL_TABLET | Freq: Every day | ORAL | 3 refills | Status: DC
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
## 2016-03-28 NOTE — Patient Instructions (Signed)

## 2016-03-28 NOTE — Telephone Encounter (Signed)
Chart reviewed.

## 2016-03-29 ENCOUNTER — Other Ambulatory Visit: Payer: Self-pay | Admitting: *Deleted

## 2016-03-29 DIAGNOSIS — C50911 Malignant neoplasm of unspecified site of right female breast: Secondary | ICD-10-CM

## 2016-03-29 DIAGNOSIS — C50211 Malignant neoplasm of upper-inner quadrant of right female breast: Secondary | ICD-10-CM

## 2016-03-30 ENCOUNTER — Other Ambulatory Visit (HOSPITAL_BASED_OUTPATIENT_CLINIC_OR_DEPARTMENT_OTHER): Payer: Medicare Other

## 2016-03-30 DIAGNOSIS — C50911 Malignant neoplasm of unspecified site of right female breast: Secondary | ICD-10-CM | POA: Diagnosis not present

## 2016-03-30 DIAGNOSIS — C50211 Malignant neoplasm of upper-inner quadrant of right female breast: Secondary | ICD-10-CM

## 2016-03-30 LAB — CBC WITH DIFFERENTIAL/PLATELET
BASO%: 0.4 % (ref 0.0–2.0)
Basophils Absolute: 0 10*3/uL (ref 0.0–0.1)
EOS ABS: 0 10*3/uL (ref 0.0–0.5)
EOS%: 0.2 % (ref 0.0–7.0)
HCT: 37.7 % (ref 34.8–46.6)
HEMOGLOBIN: 12.5 g/dL (ref 11.6–15.9)
LYMPH%: 19.1 % (ref 14.0–49.7)
MCH: 31.1 pg (ref 25.1–34.0)
MCHC: 33.2 g/dL (ref 31.5–36.0)
MCV: 93.5 fL (ref 79.5–101.0)
MONO#: 0.4 10*3/uL (ref 0.1–0.9)
MONO%: 5.3 % (ref 0.0–14.0)
NEUT%: 75 % (ref 38.4–76.8)
NEUTROS ABS: 5 10*3/uL (ref 1.5–6.5)
Platelets: 374 10*3/uL (ref 145–400)
RBC: 4.03 10*6/uL (ref 3.70–5.45)
RDW: 13.3 % (ref 11.2–14.5)
WBC: 6.7 10*3/uL (ref 3.9–10.3)
lymph#: 1.3 10*3/uL (ref 0.9–3.3)

## 2016-03-30 LAB — COMPREHENSIVE METABOLIC PANEL
ALBUMIN: 3 g/dL — AB (ref 3.5–5.0)
ALK PHOS: 66 U/L (ref 40–150)
ALT: 20 U/L (ref 0–55)
AST: 18 U/L (ref 5–34)
Anion Gap: 8 mEq/L (ref 3–11)
BILIRUBIN TOTAL: 0.44 mg/dL (ref 0.20–1.20)
BUN: 24.1 mg/dL (ref 7.0–26.0)
CO2: 27 meq/L (ref 22–29)
CREATININE: 0.7 mg/dL (ref 0.6–1.1)
Calcium: 9.6 mg/dL (ref 8.4–10.4)
Chloride: 110 mEq/L — ABNORMAL HIGH (ref 98–109)
EGFR: 89 mL/min/{1.73_m2} — AB (ref >90)
GLUCOSE: 104 mg/dL (ref 70–140)
Potassium: 4.7 mEq/L (ref 3.5–5.1)
SODIUM: 145 meq/L (ref 136–145)
TOTAL PROTEIN: 7.2 g/dL (ref 6.4–8.3)

## 2016-04-06 ENCOUNTER — Ambulatory Visit (HOSPITAL_BASED_OUTPATIENT_CLINIC_OR_DEPARTMENT_OTHER): Payer: Medicare Other | Admitting: Oncology

## 2016-04-06 VITALS — BP 110/70 | HR 79 | Temp 98.6°F | Resp 18 | Ht 67.0 in | Wt 124.0 lb

## 2016-04-06 DIAGNOSIS — Z853 Personal history of malignant neoplasm of breast: Secondary | ICD-10-CM

## 2016-04-06 DIAGNOSIS — C50911 Malignant neoplasm of unspecified site of right female breast: Secondary | ICD-10-CM

## 2016-04-06 DIAGNOSIS — Z170 Estrogen receptor positive status [ER+]: Secondary | ICD-10-CM

## 2016-04-06 DIAGNOSIS — C50211 Malignant neoplasm of upper-inner quadrant of right female breast: Secondary | ICD-10-CM

## 2016-04-06 DIAGNOSIS — M810 Age-related osteoporosis without current pathological fracture: Secondary | ICD-10-CM | POA: Diagnosis not present

## 2016-04-06 DIAGNOSIS — C801 Malignant (primary) neoplasm, unspecified: Secondary | ICD-10-CM
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
## 2016-05-12 ENCOUNTER — Other Ambulatory Visit: Payer: Self-pay | Admitting: Nurse Practitioner

## 2016-10-05 ENCOUNTER — Ambulatory Visit: Payer: Medicare Other | Admitting: Oncology

## 2016-10-05 ENCOUNTER — Other Ambulatory Visit: Payer: Medicare Other

## 2016-10-26 ENCOUNTER — Other Ambulatory Visit: Payer: Self-pay | Admitting: *Deleted

## 2016-10-26 DIAGNOSIS — C50911 Malignant neoplasm of unspecified site of right female breast: Secondary | ICD-10-CM

## 2016-10-26 DIAGNOSIS — G459 Transient cerebral ischemic attack, unspecified: Secondary | ICD-10-CM

## 2016-10-27 ENCOUNTER — Other Ambulatory Visit: Payer: Medicare Other

## 2016-10-27 ENCOUNTER — Ambulatory Visit: Payer: Medicare Other | Admitting: Oncology

## 2016-11-08 ENCOUNTER — Other Ambulatory Visit: Payer: Medicare Other

## 2016-11-08 ENCOUNTER — Ambulatory Visit: Payer: Medicare Other | Admitting: Oncology

## 2016-11-14 ENCOUNTER — Other Ambulatory Visit (HOSPITAL_BASED_OUTPATIENT_CLINIC_OR_DEPARTMENT_OTHER): Payer: Medicare Other

## 2016-11-14 ENCOUNTER — Ambulatory Visit (HOSPITAL_BASED_OUTPATIENT_CLINIC_OR_DEPARTMENT_OTHER): Payer: Medicare Other | Admitting: Oncology

## 2016-11-14 VITALS — BP 121/77 | HR 72 | Temp 98.2°F | Resp 17 | Ht 67.0 in | Wt 124.8 lb

## 2016-11-14 DIAGNOSIS — C50911 Malignant neoplasm of unspecified site of right female breast: Secondary | ICD-10-CM

## 2016-11-14 DIAGNOSIS — M810 Age-related osteoporosis without current pathological fracture: Secondary | ICD-10-CM

## 2016-11-14 DIAGNOSIS — Z170 Estrogen receptor positive status [ER+]: Secondary | ICD-10-CM

## 2016-11-14 DIAGNOSIS — M818 Other osteoporosis without current pathological fracture: Secondary | ICD-10-CM

## 2016-11-14 DIAGNOSIS — Z86000 Personal history of in-situ neoplasm of breast: Secondary | ICD-10-CM

## 2016-11-14 DIAGNOSIS — C50211 Malignant neoplasm of upper-inner quadrant of right female breast: Secondary | ICD-10-CM

## 2016-11-14 DIAGNOSIS — N951 Menopausal and female climacteric states: Secondary | ICD-10-CM

## 2016-11-14 DIAGNOSIS — G459 Transient cerebral ischemic attack, unspecified: Secondary | ICD-10-CM

## 2016-11-14 LAB — CBC WITH DIFFERENTIAL/PLATELET
BASO%: 0.4 % (ref 0.0–2.0)
BASOS ABS: 0 10*3/uL (ref 0.0–0.1)
EOS%: 0.4 % (ref 0.0–7.0)
Eosinophils Absolute: 0 10*3/uL (ref 0.0–0.5)
HEMATOCRIT: 38.5 % (ref 34.8–46.6)
HGB: 13.1 g/dL (ref 11.6–15.9)
LYMPH#: 1.4 10*3/uL (ref 0.9–3.3)
LYMPH%: 24.3 % (ref 14.0–49.7)
MCH: 32.1 pg (ref 25.1–34.0)
MCHC: 34 g/dL (ref 31.5–36.0)
MCV: 94.4 fL (ref 79.5–101.0)
MONO#: 0.3 10*3/uL (ref 0.1–0.9)
MONO%: 5.8 % (ref 0.0–14.0)
NEUT#: 3.8 10*3/uL (ref 1.5–6.5)
NEUT%: 69.1 % (ref 38.4–76.8)
PLATELETS: 202 10*3/uL (ref 145–400)
RBC: 4.08 10*6/uL (ref 3.70–5.45)
RDW: 13.3 % (ref 11.2–14.5)
WBC: 5.6 10*3/uL (ref 3.9–10.3)

## 2016-11-14 LAB — COMPREHENSIVE METABOLIC PANEL
ALT: 31 U/L (ref 0–55)
ANION GAP: 9 meq/L (ref 3–11)
AST: 27 U/L (ref 5–34)
Albumin: 3.7 g/dL (ref 3.5–5.0)
Alkaline Phosphatase: 56 U/L (ref 40–150)
BUN: 19.5 mg/dL (ref 7.0–26.0)
CALCIUM: 9.5 mg/dL (ref 8.4–10.4)
CHLORIDE: 104 meq/L (ref 98–109)
CO2: 26 mEq/L (ref 22–29)
Creatinine: 0.7 mg/dL (ref 0.6–1.1)
EGFR: 89 mL/min/{1.73_m2} — AB (ref >90)
Glucose: 87 mg/dl (ref 70–140)
POTASSIUM: 4.2 meq/L (ref 3.5–5.1)
Sodium: 140 mEq/L (ref 136–145)
Total Bilirubin: 0.82 mg/dL (ref 0.20–1.20)
Total Protein: 6.9 g/dL (ref 6.4–8.3)
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
## 2016-11-21 ENCOUNTER — Ambulatory Visit
Admission: RE | Admit: 2016-11-21 | Discharge: 2016-11-21 | Disposition: A | Discharge status: Home / Self Care | Payer: Medicare Other | Source: Ambulatory Visit | Attending: Oncology | Admitting: Oncology

## 2016-11-21 DIAGNOSIS — C50911 Malignant neoplasm of unspecified site of right female breast: Secondary | ICD-10-CM

## 2016-11-21 DIAGNOSIS — C50211 Malignant neoplasm of upper-inner quadrant of right female breast: Secondary | ICD-10-CM

## 2016-11-21 DIAGNOSIS — C801 Malignant (primary) neoplasm, unspecified: Secondary | ICD-10-CM

## 2016-11-21 DIAGNOSIS — Z170 Estrogen receptor positive status [ER+]: Secondary | ICD-10-CM

## 2016-11-24 ENCOUNTER — Ambulatory Visit (INDEPENDENT_AMBULATORY_CARE_PROVIDER_SITE_OTHER): Payer: Medicare Other | Admitting: Sports Medicine

## 2016-11-24 ENCOUNTER — Ambulatory Visit
Admission: RE | Admit: 2016-11-24 | Discharge: 2016-11-24 | Disposition: A | Discharge status: Home / Self Care | Payer: Medicare Other | Source: Ambulatory Visit | Attending: Sports Medicine | Admitting: Sports Medicine

## 2016-11-24 VITALS — BP 118/78 | Ht 67.0 in | Wt 125.0 lb

## 2016-11-24 DIAGNOSIS — M25511 Pain in right shoulder: Secondary | ICD-10-CM | POA: Diagnosis not present

## 2016-11-25 NOTE — Progress Notes (Signed)
   Subjective:    Patient ID: Christina Daniel, female    DOB: 1950-12-09, 66 y.o.   MRN: [id number]  HPI chief complaint: Right shoulder pain  Very pleasant 66 year old right-hand-dominant female comes in today at the request of Dr.Wong for evaluation of right shoulder pain. Her pain began a few months ago when she was reaching in the backseat of her car. She felt a pull in the anterior lateral shoulder at that time. Since then she has noticed returning discomfort when putting her arm in an abducted and extended position such as reaching into the backseat of her automobile. She has also begun to notice some decreased range of motion. In fact, it is this decreased range of motion that bothers her more than pain. She describes her shoulder discomfort as a feeling of "tightness". She denies any nighttime pain. Denies problems with this shoulder in the past. No numbness or tingling. She's not tried any medications. She's not had any imaging.  Her past medical history is reviewed.  Medications reviewed Allergies reviewed    Review of Systems As above    Objective:   Physical Exam  Well-developed, well-nourished. No acute distress. Awake alert and oriented 3. Vital signs reviewed  Right shoulder: Patient has 160 of active forward flexion. 150 of active abduction. Internal rotation is limited to about 70. External rotation is 80. She has no tenderness to palpation over the acromioclavicular joint nor over the bicipital groove. Rotator cuff strength is 5/5 with resisted supraspinatus but 4+/5 with resisted external rotation. 5/5 with resisted internal rotation. Negative empty can, negative Hawkins. Negative O'Brien's. Neurovascularly intact distally.  X-rays of the right shoulder show no significant degenerative changes. Nothing acute.       Assessment & Plan:   Right shoulder pain secondary to adhesive capsulitis-rule out rotator cuff tear  Patient definitely has findings consistent with  early adhesive capsulitis. She may have a tear of her infraspinatus since she has some weakness with resisted external rotation. We will schedule an ultrasound of her right shoulder to be done next week. In the meantime, she will start some pendulum exercises and some wall walks. We will delineate further workup and treatment based on her ultrasound findings.

## 2016-11-30 ENCOUNTER — Ambulatory Visit (INDEPENDENT_AMBULATORY_CARE_PROVIDER_SITE_OTHER): Payer: Medicare Other | Admitting: Family Medicine

## 2016-11-30 VITALS — BP 114/78 | Ht 67.0 in | Wt 125.0 lb

## 2016-11-30 DIAGNOSIS — M25511 Pain in right shoulder: Secondary | ICD-10-CM

## 2016-11-30 NOTE — Progress Notes (Signed)
Procedure note: Christina Daniel is a 66 year old female, who presents to the sports medicine office today for ultrasound of right shoulder. She was seen in the office back on 11/24/16 by Dr. Micheline Chapman for initial evaluation of right shoulder pain. Please see his office note from 11/24/16 for further history details. She reports today no interval improvement or worsening symptoms. She still points to pain in anterior and lateral aspect of her shoulder, reports of popping and crepitus sensation in the anterior aspect of her right shoulder. She does not report of any numbness, tingling, or weakness in her right arm. She is right hand dominant. She reports that she has been doing home physical therapy exercises, including pendulums. She reports continued decreased range of motion, specifically with internal range of motion, describes difficulty with strapping on her bra. She has not reported any fevers, chills, or night sweats. She was diagnosed with right shoulder pain, secondary to adhesive capsulitis, with questionable rotator cuff pathology and/or labral pathology.  Ultrasound was done in the office today of her right shoulder. Her biceps tendon, acromioclavicular joint, scapular areas, supraspinatus, infraspinatus, and glenohumeral joint were seen and evaluated today under ultrasound. No obvious abnormality was seen, no evidence of partial tearing, no hypoechoic changes seen. No obvious spurring or any other arthritic signs seen along the acromioclavicular joint and glenohumeral joint. Dynamic testing did not show any obvious impingement.  Impression: No significant rotator cuff pathology seen under ultrasound guidance  Plan: Discussed given crepitus, mechanism of injury, and limited range of motion and pain with abduction, internal range of motion, and external range of motion, do have high suspicion for labral pathology, specifically for labral tear. Will order MRI to rule out labral tear. Dr. Micheline Chapman will call patient  after MRI has been done, with further plan of action to be determined after MRI results.  Mort Sawyers, M.D. [facility]

## 2016-12-19 ENCOUNTER — Other Ambulatory Visit: Payer: Medicare Other

## 2017-01-08 ENCOUNTER — Ambulatory Visit
Admission: RE | Admit: 2017-01-08 | Discharge: 2017-01-08 | Disposition: A | Discharge status: Home / Self Care | Payer: Medicare Other | Source: Ambulatory Visit | Attending: Sports Medicine | Admitting: Sports Medicine

## 2017-01-08 DIAGNOSIS — M25511 Pain in right shoulder: Secondary | ICD-10-CM

## 2017-01-09 ENCOUNTER — Telehealth: Payer: Self-pay | Admitting: Sports Medicine

## 2017-01-09 NOTE — Telephone Encounter (Signed)
I spoke with the patient on the phone today after reviewing the MRI of her right shoulder. She has moderate tendinosis of the supraspinatus tendon with a partial thickness bursal surface tear. She has several months of symptoms. I recommend referral to Dr. Mardelle Matte to discuss further treatment. He may very well recommend continued conservative care but based on her length of symptoms and worsening pain, I think a referral to him is warranted. I'll defer further workup and treatment to the discretion of Dr. Mardelle Matte. Patient will follow-up with me as needed.

## 2017-01-10 ENCOUNTER — Other Ambulatory Visit: Payer: Self-pay

## 2017-01-10 ENCOUNTER — Telehealth: Payer: Self-pay

## 2017-01-10 DIAGNOSIS — M75101 Unspecified rotator cuff tear or rupture of right shoulder, not specified as traumatic: Secondary | ICD-10-CM

## 2017-01-10 NOTE — Telephone Encounter (Signed)
See phone note

## 2017-01-12 ENCOUNTER — Telehealth: Payer: Self-pay

## 2017-01-12 NOTE — Telephone Encounter (Signed)
Pt has called back and now decided to see Dr. Mardelle Matte at [facility]. All notes have been faxed over.

## 2017-02-13 ENCOUNTER — Other Ambulatory Visit: Payer: Self-pay | Admitting: Oncology

## 2017-02-13 DIAGNOSIS — Z1231 Encounter for screening mammogram for malignant neoplasm of breast: Secondary | ICD-10-CM

## 2017-03-15 ENCOUNTER — Encounter: Payer: Self-pay | Admitting: *Deleted

## 2017-03-28 ENCOUNTER — Other Ambulatory Visit: Payer: Medicare Other

## 2017-03-28 ENCOUNTER — Ambulatory Visit: Payer: Medicare Other | Admitting: Interventional Cardiology

## 2017-03-28 ENCOUNTER — Encounter: Payer: Self-pay | Admitting: Interventional Cardiology

## 2017-03-28 VITALS — BP 116/82 | HR 83 | Ht 67.0 in | Wt 126.0 lb

## 2017-03-28 DIAGNOSIS — I712 Thoracic aortic aneurysm, without rupture, unspecified: Secondary | ICD-10-CM

## 2017-03-28 DIAGNOSIS — I7121 Aneurysm of the ascending aorta, without rupture: Secondary | ICD-10-CM

## 2017-03-28 DIAGNOSIS — I10 Essential (primary) hypertension: Secondary | ICD-10-CM
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
## 2017-03-28 NOTE — Patient Instructions (Signed)
Medication Instructions:  Your physician recommends that you continue on your current medications as directed. Please refer to the Current Medication list given to you today.  Labwork: BMET prior to your CT scan.  Testing/Procedures: Your physician would like for you to have a Chest CT completed. (CT Angio Chest Aorta)  Follow-Up: Your physician wants you to follow-up in: 1 year with Dr. Tamala Julian.  You will receive a reminder letter in the mail two months in advance. If you don't receive a letter, please call our office to schedule the follow-up appointment.   Any Other Special Instructions Will Be Listed Below (If Applicable).     If you need a refill on your cardiac medications before your next appointment, please call your pharmacy.

## 2017-03-28 NOTE — Addendum Note (Signed)
Addended by: Marciano Sequin on: 03/28/2017 04:05 PM   Modules accepted: Orders

## 2017-03-29 LAB — BASIC METABOLIC PANEL
BUN / CREAT RATIO: 45 — AB (ref 12–28)
BUN: 25 mg/dL (ref 8–27)
CHLORIDE: 106 mmol/L (ref 96–106)
CO2: 22 mmol/L (ref 20–29)
Calcium: 9.4 mg/dL (ref 8.7–10.3)
Creatinine, Ser: 0.55 mg/dL — ABNORMAL LOW (ref 0.57–1.00)
GFR, EST AFRICAN AMERICAN: 111 mL/min/{1.73_m2} (ref >59)
GFR, EST NON AFRICAN AMERICAN: 97 mL/min/{1.73_m2} (ref >59)
Glucose: 90 mg/dL (ref 65–99)
POTASSIUM: 4.2 mmol/L (ref 3.5–5.2)
SODIUM: 145 mmol/L — AB (ref 134–144)

## 2017-03-30 ENCOUNTER — Other Ambulatory Visit: Payer: Self-pay | Admitting: Oncology

## 2017-03-30 ENCOUNTER — Ambulatory Visit
Admission: RE | Admit: 2017-03-30 | Discharge: 2017-03-30 | Disposition: A | Discharge status: Home / Self Care | Payer: Medicare Other | Source: Ambulatory Visit | Attending: Oncology | Admitting: Oncology

## 2017-03-30 DIAGNOSIS — Z1231 Encounter for screening mammogram for malignant neoplasm of breast: Secondary | ICD-10-CM

## 2017-03-30 HISTORY — DX: Personal history of antineoplastic chemotherapy: Z92.21

## 2017-03-30 HISTORY — DX: Personal history of irradiation: Z92.3

## 2017-04-01 ENCOUNTER — Other Ambulatory Visit: Payer: Self-pay | Admitting: Oncology

## 2017-04-01 ENCOUNTER — Encounter: Payer: Self-pay | Admitting: Interventional Cardiology

## 2017-04-01 DIAGNOSIS — C50911 Malignant neoplasm of unspecified site of right female breast: Secondary | ICD-10-CM

## 2017-04-04 NOTE — Telephone Encounter (Signed)
Refills to get to 08/15/17 appt where it will be discussed to continue or dc tamoxifen

## 2017-04-10 ENCOUNTER — Inpatient Hospital Stay: Admission: RE | Admit: 2017-04-10 | Payer: Medicare Other | Source: Ambulatory Visit

## 2017-04-17 ENCOUNTER — Ambulatory Visit (INDEPENDENT_AMBULATORY_CARE_PROVIDER_SITE_OTHER)
Admission: RE | Admit: 2017-04-17 | Discharge: 2017-04-17 | Disposition: A | Discharge status: Home / Self Care | Payer: Medicare Other | Source: Ambulatory Visit | Attending: Interventional Cardiology | Admitting: Interventional Cardiology

## 2017-04-17 DIAGNOSIS — I712 Thoracic aortic aneurysm, without rupture, unspecified: Secondary | ICD-10-CM

## 2017-04-17 MED ORDER — IOPAMIDOL (ISOVUE-370) INJECTION 76%
100.0000 mL | Freq: Once | INTRAVENOUS | Status: AC | PRN
  Administered 2017-04-17: 100 mL via INTRAVENOUS

## 2017-04-18 ENCOUNTER — Telehealth: Payer: Self-pay | Admitting: Interventional Cardiology

## 2017-04-18 NOTE — Telephone Encounter (Signed)
Informed pt of CTA results. Pt verbalized understanding.

## 2017-04-18 NOTE — Telephone Encounter (Signed)
Mrs. Fedorko is returning your Call. Thanks

## 2017-04-19 ENCOUNTER — Other Ambulatory Visit: Payer: Medicare Other

## 2017-07-14 ENCOUNTER — Telehealth: Payer: Self-pay | Admitting: Oncology

## 2017-07-14 NOTE — Telephone Encounter (Signed)
Patient called to reschedule  °

## 2017-08-15 ENCOUNTER — Other Ambulatory Visit: Payer: Medicare Other

## 2017-08-15 ENCOUNTER — Ambulatory Visit: Payer: Medicare Other | Admitting: Oncology
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
## 2017-09-01 ENCOUNTER — Other Ambulatory Visit: Payer: Self-pay | Admitting: *Deleted

## 2017-09-01 DIAGNOSIS — Z170 Estrogen receptor positive status [ER+]: Secondary | ICD-10-CM

## 2017-09-01 DIAGNOSIS — C50211 Malignant neoplasm of upper-inner quadrant of right female breast: Secondary | ICD-10-CM

## 2017-09-01 DIAGNOSIS — C50911 Malignant neoplasm of unspecified site of right female breast: Secondary | ICD-10-CM

## 2017-09-04 ENCOUNTER — Telehealth: Payer: Self-pay | Admitting: Oncology

## 2017-09-04 ENCOUNTER — Inpatient Hospital Stay: Payer: Medicare Other | Attending: Oncology

## 2017-09-04 ENCOUNTER — Inpatient Hospital Stay: Payer: Medicare Other | Admitting: Oncology

## 2017-09-04 VITALS — BP 136/87 | HR 81 | Temp 98.6°F | Resp 18 | Ht 67.0 in | Wt 123.9 lb

## 2017-09-04 DIAGNOSIS — Z79810 Long term (current) use of selective estrogen receptor modulators (SERMs): Secondary | ICD-10-CM | POA: Diagnosis not present

## 2017-09-04 DIAGNOSIS — Z923 Personal history of irradiation: Secondary | ICD-10-CM | POA: Diagnosis not present

## 2017-09-04 DIAGNOSIS — R000 Tachycardia, unspecified: Secondary | ICD-10-CM | POA: Diagnosis not present

## 2017-09-04 DIAGNOSIS — M818 Other osteoporosis without current pathological fracture: Secondary | ICD-10-CM

## 2017-09-04 DIAGNOSIS — Z79899 Other long term (current) drug therapy: Secondary | ICD-10-CM | POA: Diagnosis not present

## 2017-09-04 DIAGNOSIS — C50211 Malignant neoplasm of upper-inner quadrant of right female breast: Secondary | ICD-10-CM | POA: Diagnosis not present

## 2017-09-04 DIAGNOSIS — Z86010 Personal history of colonic polyps: Secondary | ICD-10-CM | POA: Insufficient documentation

## 2017-09-04 DIAGNOSIS — F329 Major depressive disorder, single episode, unspecified: Secondary | ICD-10-CM | POA: Insufficient documentation

## 2017-09-04 DIAGNOSIS — I712 Thoracic aortic aneurysm, without rupture: Secondary | ICD-10-CM | POA: Diagnosis not present

## 2017-09-04 DIAGNOSIS — I10 Essential (primary) hypertension: Secondary | ICD-10-CM | POA: Diagnosis not present

## 2017-09-04 DIAGNOSIS — K760 Fatty (change of) liver, not elsewhere classified: Secondary | ICD-10-CM | POA: Insufficient documentation

## 2017-09-04 DIAGNOSIS — M810 Age-related osteoporosis without current pathological fracture: Secondary | ICD-10-CM | POA: Insufficient documentation

## 2017-09-04 DIAGNOSIS — Z9011 Acquired absence of right breast and nipple: Secondary | ICD-10-CM | POA: Diagnosis not present

## 2017-09-04 DIAGNOSIS — C50011 Malignant neoplasm of nipple and areola, right female breast: Secondary | ICD-10-CM | POA: Insufficient documentation

## 2017-09-04 DIAGNOSIS — E785 Hyperlipidemia, unspecified: Secondary | ICD-10-CM | POA: Diagnosis not present

## 2017-09-04 DIAGNOSIS — I714 Abdominal aortic aneurysm, without rupture: Secondary | ICD-10-CM | POA: Insufficient documentation

## 2017-09-04 DIAGNOSIS — Z7982 Long term (current) use of aspirin: Secondary | ICD-10-CM | POA: Diagnosis not present

## 2017-09-04 DIAGNOSIS — Z170 Estrogen receptor positive status [ER+]: Secondary | ICD-10-CM | POA: Diagnosis not present

## 2017-09-04 DIAGNOSIS — I7121 Aneurysm of the ascending aorta, without rupture: Secondary | ICD-10-CM

## 2017-09-04 DIAGNOSIS — C50911 Malignant neoplasm of unspecified site of right female breast: Secondary | ICD-10-CM

## 2017-09-04 DIAGNOSIS — Z808 Family history of malignant neoplasm of other organs or systems: Secondary | ICD-10-CM | POA: Diagnosis not present

## 2017-09-04 DIAGNOSIS — Z9221 Personal history of antineoplastic chemotherapy: Secondary | ICD-10-CM | POA: Insufficient documentation

## 2017-09-04 LAB — CMP (CANCER CENTER ONLY)
ALBUMIN: 4.1 g/dL (ref 3.5–5.0)
ALT: 26 U/L (ref 0–55)
AST: 27 U/L (ref 5–34)
Alkaline Phosphatase: 51 U/L (ref 40–150)
Anion gap: 7 (ref 3–11)
BUN: 17 mg/dL (ref 7–26)
CHLORIDE: 106 mmol/L (ref 98–109)
CO2: 29 mmol/L (ref 22–29)
CREATININE: 0.71 mg/dL (ref 0.60–1.10)
Calcium: 9.8 mg/dL (ref 8.4–10.4)
GFR, Est AFR Am: >60 mL/min (ref >60)
GFR, Estimated: >60 mL/min (ref >60)
GLUCOSE: 95 mg/dL (ref 70–140)
Potassium: 4.5 mmol/L (ref 3.5–5.1)
SODIUM: 142 mmol/L (ref 136–145)
Total Bilirubin: 0.9 mg/dL (ref 0.2–1.2)
Total Protein: 7.1 g/dL (ref 6.4–8.3)

## 2017-09-04 LAB — CBC WITH DIFFERENTIAL (CANCER CENTER ONLY)
Basophils Absolute: 0 10*3/uL (ref 0.0–0.1)
Basophils Relative: 0 %
Eosinophils Absolute: 0 10*3/uL (ref 0.0–0.5)
Eosinophils Relative: 0 %
HEMATOCRIT: 40 % (ref 34.8–46.6)
HEMOGLOBIN: 13.3 g/dL (ref 11.6–15.9)
LYMPHS ABS: 1.4 10*3/uL (ref 0.9–3.3)
Lymphocytes Relative: 15 %
MCH: 31.7 pg (ref 25.1–34.0)
MCHC: 33.3 g/dL (ref 31.5–36.0)
MCV: 95.5 fL (ref 79.5–101.0)
MONOS PCT: 4 %
Monocytes Absolute: 0.4 10*3/uL (ref 0.1–0.9)
NEUTROS PCT: 81 %
Neutro Abs: 7.1 10*3/uL — ABNORMAL HIGH (ref 1.5–6.5)
Platelet Count: 213 10*3/uL (ref 145–400)
RBC: 4.19 MIL/uL (ref 3.70–5.45)
RDW: 13.5 % (ref 11.2–14.5)
WBC Count: 9 10*3/uL (ref 3.9–10.3)

## 2017-09-04 MED ORDER — TAMOXIFEN CITRATE 20 MG PO TABS: 20.0000 mg | ORAL_TABLET | Freq: Every day | ORAL | 4 refills | Status: DC

## 2017-09-04 NOTE — Telephone Encounter (Signed)
Gave avs and calendar ° °

## 2018-02-02 ENCOUNTER — Other Ambulatory Visit: Payer: Self-pay | Admitting: Oncology

## 2018-02-02 DIAGNOSIS — Z1231 Encounter for screening mammogram for malignant neoplasm of breast: Secondary | ICD-10-CM

## 2018-04-02 ENCOUNTER — Ambulatory Visit
Admission: RE | Admit: 2018-04-02 | Discharge: 2018-04-02 | Disposition: A | Discharge status: Home / Self Care | Payer: Medicare Other | Source: Ambulatory Visit | Attending: Oncology | Admitting: Oncology

## 2018-04-02 DIAGNOSIS — Z1231 Encounter for screening mammogram for malignant neoplasm of breast: Secondary | ICD-10-CM

## 2018-04-11 ENCOUNTER — Encounter: Payer: Self-pay | Admitting: Interventional Cardiology

## 2018-04-16 ENCOUNTER — Ambulatory Visit: Payer: Medicare Other | Admitting: Interventional Cardiology

## 2018-05-03 DIAGNOSIS — I2510 Atherosclerotic heart disease of native coronary artery without angina pectoris: Secondary | ICD-10-CM | POA: Insufficient documentation
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
## 2018-05-04 ENCOUNTER — Ambulatory Visit: Payer: Medicare Other | Admitting: Interventional Cardiology

## 2018-05-04 ENCOUNTER — Encounter: Payer: Self-pay | Admitting: Interventional Cardiology

## 2018-05-04 VITALS — BP 122/84 | HR 74 | Ht 67.0 in | Wt 121.4 lb

## 2018-05-04 DIAGNOSIS — I2510 Atherosclerotic heart disease of native coronary artery without angina pectoris: Secondary | ICD-10-CM | POA: Diagnosis not present

## 2018-05-04 DIAGNOSIS — I10 Essential (primary) hypertension: Secondary | ICD-10-CM | POA: Diagnosis not present

## 2018-05-04 DIAGNOSIS — E781 Pure hyperglyceridemia: Secondary | ICD-10-CM

## 2018-05-04 DIAGNOSIS — I7789 Other specified disorders of arteries and arterioles: Secondary | ICD-10-CM

## 2018-05-04 DIAGNOSIS — I712 Thoracic aortic aneurysm, without rupture: Secondary | ICD-10-CM | POA: Diagnosis not present

## 2018-05-04 DIAGNOSIS — I7121 Aneurysm of the ascending aorta, without rupture: Secondary | ICD-10-CM

## 2018-05-04 NOTE — Patient Instructions (Addendum)
Medication Instructions:  Your physician recommends that you continue on your current medications as directed. Please refer to the Current Medication list given to you today.  If you need a refill on your cardiac medications before your next appointment, please call your pharmacy.   Lab work: You will need a BMET prior to your Coronary CT If you have labs (blood work) drawn today and your tests are completely normal, you will receive your results only by: [name] MyChart Message (if you have MyChart) OR . A paper copy in the mail If you have any lab test that is abnormal or we need to change your treatment, we will call you to review the results.  Testing/Procedures: Your physician recommends that you have a Coronary CT performed.  Follow-Up: At [facility], you and your health needs are our priority.  As part of our continuing mission to provide you with exceptional heart care, we have created designated Provider Care Teams.  These Care Teams include your primary Cardiologist (physician) and Advanced Practice Providers (APPs -  Physician Assistants and Nurse Practitioners) who all work together to provide you with the care you need, when you need it. You will need a follow up appointment in 12 months.  Please call our office 2 months in advance to schedule this appointment.  You may see Sinclair Grooms, MD or one of the following Advanced Practice Providers on your designated Care Team:   Truitt Merle, NP Cecilie Kicks, NP . Kathyrn Drown, NP  Any Other Special Instructions Will Be Listed Below (If Applicable).  Please arrive at the [facility] main entrance of [facility] 30-45 minutes prior to test start time  [facility] [place], [place] [address] [phone]  Proceed to the [facility] Radiology Department (First Floor).  Please follow these instructions carefully (unless otherwise directed):  Hold all erectile dysfunction medications at  least 48 hours prior to test.  On the Night Before the Test: . Be sure to Drink plenty of water. . Do not consume any caffeinated/decaffeinated beverages or chocolate 12 hours prior to your test. . Do not take any antihistamines 12 hours prior to your test. . If you take Metformin do not take 24 hours prior to test.  On the Day of the Test: . Drink plenty of water. Do not drink any water within one hour of the test. . Do not eat any food 4 hours prior to the test. . You may take your regular medications prior to the test.  . Take two of your metoprolol (Lopressor) the day of your test. . HOLD Furosemide/Hydrochlorothiazide morning of the test.       After the Test: . Drink plenty of water. . After receiving IV contrast, you may experience a mild flushed feeling. This is normal. . On occasion, you may experience a mild rash up to 24 hours after the test. This is not dangerous. If this occurs, you can take Benadryl 25 mg and increase your fluid intake. . If you experience trouble breathing, this can be serious. If it is severe call 911 IMMEDIATELY. If it is mild, please call our office. . If you take any of these medications: Glipizide/Metformin, Avandament, Glucavance, please do not take 48 hours after completing test.

## 2018-05-08 ENCOUNTER — Telehealth: Payer: Self-pay | Admitting: Interventional Cardiology

## 2018-05-08 NOTE — Telephone Encounter (Signed)
New message:   Patient calling stating that the dr. Was going to call her about a appt and she states she has not herd from anyone. Please call patient back.

## 2018-05-08 NOTE — Telephone Encounter (Signed)
Spoke with pt and she was asking about getting her Coronary CT scheduled.  Advised the pt we are waiting for approval from her insurance so we can get her scheduled.  Advised once we get authorization the scheduler will contact her with an appt.  Pt verbalized understanding.

## 2018-05-24 ENCOUNTER — Other Ambulatory Visit: Payer: Medicare Other | Admitting: *Deleted

## 2018-05-24 LAB — BASIC METABOLIC PANEL
BUN/Creatinine Ratio: 33 — ABNORMAL HIGH (ref 12–28)
BUN: 16 mg/dL (ref 8–27)
CALCIUM: 9.3 mg/dL (ref 8.7–10.3)
CO2: 24 mmol/L (ref 20–29)
Chloride: 102 mmol/L (ref 96–106)
Creatinine, Ser: 0.48 mg/dL — ABNORMAL LOW (ref 0.57–1.00)
GFR calc Af Amer: 116 mL/min/{1.73_m2} (ref >59)
GFR, EST NON AFRICAN AMERICAN: 100 mL/min/{1.73_m2} (ref >59)
GLUCOSE: 83 mg/dL (ref 65–99)
Potassium: 3.8 mmol/L (ref 3.5–5.2)
SODIUM: 142 mmol/L (ref 134–144)

## 2018-05-28 ENCOUNTER — Telehealth (HOSPITAL_COMMUNITY): Payer: Self-pay | Admitting: Emergency Medicine

## 2018-05-28 NOTE — Telephone Encounter (Signed)
Left message on voicemail with name and callback number Justice Milliron RN Navigator Cardiac Imaging [facility] [phone] Office [phone] Cell  

## 2018-05-29 ENCOUNTER — Ambulatory Visit (HOSPITAL_COMMUNITY): Admission: RE | Admit: 2018-05-29 | Payer: Medicare Other | Source: Ambulatory Visit

## 2018-05-29 ENCOUNTER — Ambulatory Visit (HOSPITAL_COMMUNITY)
Admission: RE | Admit: 2018-05-29 | Discharge: 2018-05-29 | Disposition: A | Discharge status: Home / Self Care | Payer: Medicare Other | Source: Ambulatory Visit | Attending: Interventional Cardiology | Admitting: Interventional Cardiology

## 2018-05-29 DIAGNOSIS — I712 Thoracic aortic aneurysm, without rupture: Secondary | ICD-10-CM | POA: Insufficient documentation

## 2018-05-29 DIAGNOSIS — I2510 Atherosclerotic heart disease of native coronary artery without angina pectoris: Secondary | ICD-10-CM | POA: Diagnosis not present

## 2018-05-29 DIAGNOSIS — I7121 Aneurysm of the ascending aorta, without rupture: Secondary | ICD-10-CM

## 2018-05-29 DIAGNOSIS — I7789 Other specified disorders of arteries and arterioles: Secondary | ICD-10-CM

## 2018-05-29 MED ORDER — IOPAMIDOL (ISOVUE-370) INJECTION 76%
80.0000 mL | Freq: Once | INTRAVENOUS | Status: AC | PRN
  Administered 2018-05-29: 80 mL via INTRAVENOUS

## 2018-05-29 MED ORDER — NITROGLYCERIN 0.4 MG SL SUBL
0.8000 mg | SUBLINGUAL_TABLET | Freq: Once | SUBLINGUAL | Status: AC
  Administered 2018-05-29: 0.8 mg via SUBLINGUAL
  Filled 2018-05-29: qty 25

## 2018-05-29 MED ORDER — METOPROLOL TARTRATE 5 MG/5ML IV SOLN
INTRAVENOUS | Status: AC
  Filled 2018-05-29: qty 5

## 2018-05-29 MED ORDER — NITROGLYCERIN 0.4 MG SL SUBL
SUBLINGUAL_TABLET | SUBLINGUAL | Status: AC
  Filled 2018-05-29: qty 2

## 2018-05-29 MED ORDER — METOPROLOL TARTRATE 5 MG/5ML IV SOLN
5.0000 mg | INTRAVENOUS | Status: DC | PRN
  Administered 2018-05-29: 5 mg via INTRAVENOUS
  Filled 2018-05-29: qty 5

## 2018-05-31 ENCOUNTER — Telehealth: Payer: Self-pay | Admitting: *Deleted

## 2018-05-31 DIAGNOSIS — I2510 Atherosclerotic heart disease of native coronary artery without angina pectoris: Secondary | ICD-10-CM

## 2018-05-31 DIAGNOSIS — E781 Pure hyperglyceridemia: Secondary | ICD-10-CM

## 2018-05-31 NOTE — Telephone Encounter (Signed)
Left message to call back  

## 2018-05-31 NOTE — Telephone Encounter (Signed)
-----   Message from Belva Crome, MD sent at 05/31/2018  5:06 PM EST ----- Let the patient know the calcium score is elevated, there is plaque in all 3 coronaries, I need an up-to-date lipid panel, and if LDL or triglyceride or not optimal adjustments in therapy need to be made.  The aorta is 3.7 cm which is stable compared to prior evaluation.  See if we get a copy of her most recent lipid panel or she will need to come in for a fasting liver and lipid. A copy will be sent to [name], MD

## 2018-06-05 NOTE — Telephone Encounter (Signed)
Spoke with pt and went over results and recommendations per Dr. Tamala Julian.  Pt agreeable to come for labs tomorrow.  Advised once Dr. Tamala Julian reviews those I will call with further recommendations.  Pt verbalized understanding and was in agreement with plan.

## 2018-06-06 ENCOUNTER — Other Ambulatory Visit: Payer: Medicare Other | Admitting: *Deleted

## 2018-06-06 DIAGNOSIS — E781 Pure hyperglyceridemia: Secondary | ICD-10-CM

## 2018-06-06 DIAGNOSIS — I2510 Atherosclerotic heart disease of native coronary artery without angina pectoris: Secondary | ICD-10-CM

## 2018-06-06 LAB — HEPATIC FUNCTION PANEL
ALBUMIN: 4.3 g/dL (ref 3.8–4.8)
ALT: 22 IU/L (ref 0–32)
AST: 24 IU/L (ref 0–40)
Alkaline Phosphatase: 76 IU/L (ref 39–117)
BILIRUBIN TOTAL: 0.6 mg/dL (ref 0.0–1.2)
Bilirubin, Direct: 0.18 mg/dL (ref 0.00–0.40)
TOTAL PROTEIN: 6.5 g/dL (ref 6.0–8.5)

## 2018-06-06 LAB — LIPID PANEL
CHOL/HDL RATIO: 2.9 ratio (ref 0.0–4.4)
Cholesterol, Total: 122 mg/dL (ref 100–199)
HDL: 42 mg/dL (ref >39)
LDL CALC: 44 mg/dL (ref 0–99)
TRIGLYCERIDES: 181 mg/dL — AB (ref 0–149)
VLDL CHOLESTEROL CAL: 36 mg/dL (ref 5–40)

## 2018-09-05 ENCOUNTER — Other Ambulatory Visit: Payer: Medicare Other

## 2018-09-05 ENCOUNTER — Ambulatory Visit: Payer: Medicare Other | Admitting: Oncology

## 2018-09-10 ENCOUNTER — Other Ambulatory Visit: Payer: Self-pay | Admitting: Oncology

## 2018-09-10 DIAGNOSIS — C50911 Malignant neoplasm of unspecified site of right female breast: Secondary | ICD-10-CM

## 2018-09-13 ENCOUNTER — Telehealth: Payer: Self-pay | Admitting: Oncology

## 2018-09-13 NOTE — Telephone Encounter (Signed)
Scheduled yearly f/u per sch msg. Called patient, no answer, left msg.

## 2018-10-05 ENCOUNTER — Telehealth: Payer: Self-pay | Admitting: *Deleted

## 2018-10-05 NOTE — Telephone Encounter (Signed)
This RN spoke with pt per return of her VM inquiring " what is the appointment next week for ".  This RN informed her above is for yearly follow up- with pt verbalizing understanding but also to reschedule to " after the 16th"  Appointment rescheduled per above.

## 2018-10-09 ENCOUNTER — Ambulatory Visit: Payer: Medicare Other | Admitting: Adult Health

## 2018-10-17 ENCOUNTER — Telehealth: Payer: Self-pay | Admitting: Adult Health

## 2018-10-17 NOTE — Telephone Encounter (Signed)
Returned patient's phone call regarding rescheduling an appointment on 06/22, per patient's request appointment has been rescheduled for 06/30.  Message to provider.

## 2018-10-22 ENCOUNTER — Ambulatory Visit: Payer: Medicare Other | Admitting: Adult Health

## 2018-10-29 ENCOUNTER — Telehealth: Payer: Self-pay

## 2018-10-29 NOTE — Telephone Encounter (Signed)
Called and left voicemail regarding pre-screening questions for appt on 6/30 

## 2018-10-30 ENCOUNTER — Other Ambulatory Visit: Payer: Self-pay

## 2018-10-30 ENCOUNTER — Telehealth: Payer: Self-pay | Admitting: Oncology

## 2018-10-30 ENCOUNTER — Inpatient Hospital Stay: Payer: Medicare Other | Attending: Adult Health | Admitting: Adult Health

## 2018-10-30 ENCOUNTER — Encounter: Payer: Self-pay | Admitting: Adult Health

## 2018-10-30 VITALS — BP 122/89 | HR 86 | Temp 98.2°F | Resp 18 | Ht 67.0 in | Wt 123.0 lb

## 2018-10-30 DIAGNOSIS — C50911 Malignant neoplasm of unspecified site of right female breast: Secondary | ICD-10-CM

## 2018-10-30 DIAGNOSIS — I10 Essential (primary) hypertension: Secondary | ICD-10-CM | POA: Insufficient documentation

## 2018-10-30 DIAGNOSIS — R000 Tachycardia, unspecified: Secondary | ICD-10-CM | POA: Diagnosis not present

## 2018-10-30 DIAGNOSIS — Z7982 Long term (current) use of aspirin: Secondary | ICD-10-CM | POA: Diagnosis not present

## 2018-10-30 DIAGNOSIS — C50211 Malignant neoplasm of upper-inner quadrant of right female breast: Secondary | ICD-10-CM | POA: Diagnosis not present

## 2018-10-30 DIAGNOSIS — Z170 Estrogen receptor positive status [ER+]: Secondary | ICD-10-CM

## 2018-10-30 DIAGNOSIS — M810 Age-related osteoporosis without current pathological fracture: Secondary | ICD-10-CM

## 2018-10-30 DIAGNOSIS — F329 Major depressive disorder, single episode, unspecified: Secondary | ICD-10-CM

## 2018-10-30 DIAGNOSIS — Z86010 Personal history of colonic polyps: Secondary | ICD-10-CM | POA: Diagnosis not present

## 2018-10-30 DIAGNOSIS — Z9011 Acquired absence of right breast and nipple: Secondary | ICD-10-CM

## 2018-10-30 DIAGNOSIS — I712 Thoracic aortic aneurysm, without rupture: Secondary | ICD-10-CM

## 2018-10-30 DIAGNOSIS — Z79899 Other long term (current) drug therapy: Secondary | ICD-10-CM | POA: Diagnosis not present

## 2018-10-30 DIAGNOSIS — Z923 Personal history of irradiation: Secondary | ICD-10-CM | POA: Diagnosis not present

## 2018-10-30 DIAGNOSIS — Z9221 Personal history of antineoplastic chemotherapy: Secondary | ICD-10-CM | POA: Diagnosis not present

## 2018-10-30 DIAGNOSIS — Z79810 Long term (current) use of selective estrogen receptor modulators (SERMs): Secondary | ICD-10-CM

## 2018-10-30 MED ORDER — TAMOXIFEN CITRATE 20 MG PO TABS: 20.0000 mg | ORAL_TABLET | Freq: Every day | ORAL | 3 refills | Status: DC

## 2018-10-30 NOTE — Telephone Encounter (Signed)
patient decline print out of schedule

## 2018-10-30 NOTE — Patient Instructions (Signed)
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
## 2018-11-30 ENCOUNTER — Encounter: Payer: Self-pay | Admitting: Adult Health

## 2018-12-06 NOTE — Telephone Encounter (Signed)
Please see response by this RN on 8/3 and pt's response back to this RN with understanding of discussion and how she would like to proceed. Pt sent request 7/31 - response was within 24 hours of business hours.  Thank you Val

## 2019-01-14 ENCOUNTER — Encounter: Payer: Self-pay | Admitting: Adult Health

## 2019-04-04 ENCOUNTER — Other Ambulatory Visit: Payer: Self-pay | Admitting: Adult Health

## 2019-04-04 ENCOUNTER — Ambulatory Visit
Admission: RE | Admit: 2019-04-04 | Discharge: 2019-04-04 | Disposition: A | Discharge status: Home / Self Care | Payer: Medicare Other | Source: Ambulatory Visit | Attending: Adult Health | Admitting: Adult Health

## 2019-04-04 ENCOUNTER — Other Ambulatory Visit: Payer: Self-pay

## 2019-04-04 DIAGNOSIS — Z170 Estrogen receptor positive status [ER+]: Secondary | ICD-10-CM

## 2019-04-04 DIAGNOSIS — C50211 Malignant neoplasm of upper-inner quadrant of right female breast: Secondary | ICD-10-CM

## 2019-04-19 ENCOUNTER — Ambulatory Visit: Payer: Medicare Other | Attending: Internal Medicine

## 2019-04-19 DIAGNOSIS — Z20822 Contact with and (suspected) exposure to covid-19: Secondary | ICD-10-CM

## 2019-04-20 LAB — NOVEL CORONAVIRUS, NAA: SARS-CoV-2, NAA: NOT DETECTED

## 2019-07-03 ENCOUNTER — Ambulatory Visit: Payer: Medicare Other | Admitting: Interventional Cardiology

## 2019-08-01 ENCOUNTER — Encounter: Payer: Self-pay | Admitting: Oncology

## 2019-08-02 ENCOUNTER — Other Ambulatory Visit: Payer: Self-pay | Admitting: *Deleted

## 2019-08-02 DIAGNOSIS — M818 Other osteoporosis without current pathological fracture: Secondary | ICD-10-CM

## 2019-08-02 DIAGNOSIS — Z9221 Personal history of antineoplastic chemotherapy: Secondary | ICD-10-CM
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
## 2019-08-13 ENCOUNTER — Other Ambulatory Visit: Payer: Self-pay

## 2019-08-13 ENCOUNTER — Encounter: Payer: Self-pay | Admitting: Interventional Cardiology

## 2019-08-13 ENCOUNTER — Ambulatory Visit: Payer: Medicare PPO | Admitting: Interventional Cardiology

## 2019-08-13 VITALS — BP 118/84 | HR 77 | Ht 67.0 in | Wt 122.0 lb

## 2019-08-13 DIAGNOSIS — I10 Essential (primary) hypertension: Secondary | ICD-10-CM

## 2019-08-13 DIAGNOSIS — Z7189 Other specified counseling: Secondary | ICD-10-CM | POA: Diagnosis not present

## 2019-08-13 DIAGNOSIS — I712 Thoracic aortic aneurysm, without rupture: Secondary | ICD-10-CM

## 2019-08-13 DIAGNOSIS — I2510 Atherosclerotic heart disease of native coronary artery without angina pectoris: Secondary | ICD-10-CM | POA: Diagnosis not present

## 2019-08-13 DIAGNOSIS — I7121 Aneurysm of the ascending aorta, without rupture: Secondary | ICD-10-CM

## 2019-08-13 NOTE — Patient Instructions (Signed)

## 2019-08-26 ENCOUNTER — Telehealth: Payer: Self-pay | Admitting: Oncology

## 2019-08-26 NOTE — Telephone Encounter (Signed)
Rescheduled appts per provider PAL. Pt confirmed appt date and time.

## 2019-09-02 ENCOUNTER — Ambulatory Visit
Admission: RE | Admit: 2019-09-02 | Discharge: 2019-09-02 | Disposition: A | Discharge status: Home / Self Care | Payer: Medicare Other | Source: Ambulatory Visit | Attending: Oncology | Admitting: Oncology

## 2019-09-02 ENCOUNTER — Other Ambulatory Visit: Payer: Self-pay

## 2019-09-02 DIAGNOSIS — Z9221 Personal history of antineoplastic chemotherapy: Secondary | ICD-10-CM

## 2019-09-02 DIAGNOSIS — M818 Other osteoporosis without current pathological fracture: Secondary | ICD-10-CM

## 2019-10-14 DIAGNOSIS — R509 Fever, unspecified: Secondary | ICD-10-CM | POA: Diagnosis not present

## 2019-10-14 DIAGNOSIS — R0989 Other specified symptoms and signs involving the circulatory and respiratory systems: Secondary | ICD-10-CM | POA: Diagnosis not present

## 2019-10-14 DIAGNOSIS — Z03818 Encounter for observation for suspected exposure to other biological agents ruled out: Secondary | ICD-10-CM | POA: Diagnosis not present

## 2019-10-30 ENCOUNTER — Ambulatory Visit: Payer: Medicare Other | Admitting: Oncology

## 2019-10-30 ENCOUNTER — Other Ambulatory Visit: Payer: Medicare Other
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
## 2019-11-25 ENCOUNTER — Other Ambulatory Visit: Payer: Self-pay

## 2019-11-25 ENCOUNTER — Inpatient Hospital Stay: Payer: Medicare PPO | Admitting: Oncology

## 2019-11-25 ENCOUNTER — Telehealth: Payer: Self-pay | Admitting: Oncology

## 2019-11-25 ENCOUNTER — Inpatient Hospital Stay: Payer: Medicare PPO | Attending: Oncology

## 2019-11-25 VITALS — BP 136/91 | HR 59 | Temp 98.3°F | Resp 18 | Ht 67.0 in | Wt 123.4 lb

## 2019-11-25 DIAGNOSIS — C50911 Malignant neoplasm of unspecified site of right female breast: Secondary | ICD-10-CM | POA: Insufficient documentation

## 2019-11-25 DIAGNOSIS — Z170 Estrogen receptor positive status [ER+]: Secondary | ICD-10-CM | POA: Insufficient documentation

## 2019-11-25 DIAGNOSIS — C50211 Malignant neoplasm of upper-inner quadrant of right female breast: Secondary | ICD-10-CM | POA: Diagnosis not present

## 2019-11-25 DIAGNOSIS — I10 Essential (primary) hypertension: Secondary | ICD-10-CM | POA: Insufficient documentation

## 2019-11-25 DIAGNOSIS — M818 Other osteoporosis without current pathological fracture: Secondary | ICD-10-CM | POA: Diagnosis not present

## 2019-11-25 DIAGNOSIS — Z79899 Other long term (current) drug therapy: Secondary | ICD-10-CM | POA: Insufficient documentation

## 2019-11-25 DIAGNOSIS — Z79810 Long term (current) use of selective estrogen receptor modulators (SERMs): Secondary | ICD-10-CM | POA: Diagnosis not present

## 2019-11-25 LAB — CBC WITH DIFFERENTIAL/PLATELET
Abs Immature Granulocytes: 0.03 10*3/uL (ref 0.00–0.07)
Basophils Absolute: 0 10*3/uL (ref 0.0–0.1)
Basophils Relative: 1 %
Eosinophils Absolute: 0 10*3/uL (ref 0.0–0.5)
Eosinophils Relative: 1 %
HCT: 41.6 % (ref 36.0–46.0)
Hemoglobin: 13.7 g/dL (ref 12.0–15.0)
Immature Granulocytes: 1 %
Lymphocytes Relative: 22 %
Lymphs Abs: 1.2 10*3/uL (ref 0.7–4.0)
MCH: 32.2 pg (ref 26.0–34.0)
MCHC: 32.9 g/dL (ref 30.0–36.0)
MCV: 97.7 fL (ref 80.0–100.0)
Monocytes Absolute: 0.3 10*3/uL (ref 0.1–1.0)
Monocytes Relative: 6 %
Neutro Abs: 3.7 10*3/uL (ref 1.7–7.7)
Neutrophils Relative %: 69 %
Platelets: 231 10*3/uL (ref 150–400)
RBC: 4.26 MIL/uL (ref 3.87–5.11)
RDW: 13.7 % (ref 11.5–15.5)
WBC: 5.3 10*3/uL (ref 4.0–10.5)
nRBC: 0 % (ref 0.0–0.2)

## 2019-11-25 LAB — COMPREHENSIVE METABOLIC PANEL
ALT: 24 U/L (ref 0–44)
AST: 23 U/L (ref 15–41)
Albumin: 3.7 g/dL (ref 3.5–5.0)
Alkaline Phosphatase: 57 U/L (ref 38–126)
Anion gap: 10 (ref 5–15)
BUN: 20 mg/dL (ref 8–23)
CO2: 25 mmol/L (ref 22–32)
Calcium: 9.4 mg/dL (ref 8.9–10.3)
Chloride: 110 mmol/L (ref 98–111)
Creatinine, Ser: 0.7 mg/dL (ref 0.44–1.00)
GFR calc Af Amer: >60 mL/min (ref >60)
GFR calc non Af Amer: >60 mL/min (ref >60)
Glucose, Bld: 102 mg/dL — ABNORMAL HIGH (ref 70–99)
Potassium: 4.7 mmol/L (ref 3.5–5.1)
Sodium: 145 mmol/L (ref 135–145)
Total Bilirubin: 0.6 mg/dL (ref 0.3–1.2)
Total Protein: 7.1 g/dL (ref 6.5–8.1)

## 2019-11-25 NOTE — Telephone Encounter (Signed)
Scheduled appts per 7/26 los. Gave pt a print out of AVS.

## 2019-12-10 ENCOUNTER — Encounter: Payer: Self-pay | Admitting: Oncology

## 2020-02-17 DIAGNOSIS — G44319 Acute post-traumatic headache, not intractable: Secondary | ICD-10-CM | POA: Diagnosis not present

## 2020-02-17 DIAGNOSIS — S199XXA Unspecified injury of neck, initial encounter: Secondary | ICD-10-CM | POA: Diagnosis not present

## 2020-02-17 DIAGNOSIS — M542 Cervicalgia: Secondary | ICD-10-CM | POA: Diagnosis not present

## 2020-02-17 DIAGNOSIS — R0781 Pleurodynia: Secondary | ICD-10-CM | POA: Diagnosis not present

## 2020-02-17 DIAGNOSIS — M25512 Pain in left shoulder: Secondary | ICD-10-CM | POA: Diagnosis not present

## 2020-02-21 ENCOUNTER — Ambulatory Visit
Admission: RE | Admit: 2020-02-21 | Discharge: 2020-02-21 | Disposition: A | Discharge status: Home / Self Care | Payer: Self-pay | Source: Ambulatory Visit | Attending: Family Medicine | Admitting: Family Medicine

## 2020-02-21 ENCOUNTER — Other Ambulatory Visit: Payer: Self-pay | Admitting: Family Medicine

## 2020-02-21 ENCOUNTER — Other Ambulatory Visit: Payer: Self-pay

## 2020-02-21 DIAGNOSIS — Z043 Encounter for examination and observation following other accident: Secondary | ICD-10-CM | POA: Diagnosis not present

## 2020-02-21 DIAGNOSIS — G44319 Acute post-traumatic headache, not intractable: Secondary | ICD-10-CM

## 2020-03-04 ENCOUNTER — Other Ambulatory Visit: Payer: Self-pay | Admitting: Adult Health

## 2020-03-04 DIAGNOSIS — Z1231 Encounter for screening mammogram for malignant neoplasm of breast: Secondary | ICD-10-CM

## 2020-03-05 DIAGNOSIS — N950 Postmenopausal bleeding: Secondary | ICD-10-CM | POA: Diagnosis not present

## 2020-03-10 DIAGNOSIS — C541 Malignant neoplasm of endometrium: Secondary | ICD-10-CM | POA: Diagnosis not present

## 2020-03-10 DIAGNOSIS — R9389 Abnormal findings on diagnostic imaging of other specified body structures: Secondary | ICD-10-CM | POA: Diagnosis not present

## 2020-03-10 DIAGNOSIS — N950 Postmenopausal bleeding: Secondary | ICD-10-CM | POA: Diagnosis not present

## 2020-03-13 ENCOUNTER — Telehealth: Payer: Self-pay | Admitting: *Deleted

## 2020-03-13 NOTE — Telephone Encounter (Signed)
Called the patient and scheduled a new patient appt on 11/19 at 11:15 am with Dr Denman George. Patient given the address and phone number for the clinic. Patient also given the policy for visitors and masks

## 2020-03-19 ENCOUNTER — Telehealth: Payer: Self-pay

## 2020-03-19 NOTE — Telephone Encounter (Signed)
TC to patient to review meaningful use information.

## 2020-03-20 ENCOUNTER — Other Ambulatory Visit: Payer: Self-pay

## 2020-03-20 ENCOUNTER — Inpatient Hospital Stay: Payer: Medicare PPO | Attending: Gynecologic Oncology | Admitting: Gynecologic Oncology

## 2020-03-20 ENCOUNTER — Encounter: Payer: Self-pay | Admitting: Gynecologic Oncology

## 2020-03-20 ENCOUNTER — Other Ambulatory Visit: Payer: Self-pay | Admitting: Gynecologic Oncology

## 2020-03-20 VITALS — BP 137/93 | HR 99 | Temp 97.3°F | Resp 18 | Ht 67.0 in | Wt 122.8 lb

## 2020-03-20 DIAGNOSIS — Z853 Personal history of malignant neoplasm of breast: Secondary | ICD-10-CM | POA: Insufficient documentation

## 2020-03-20 DIAGNOSIS — Z7982 Long term (current) use of aspirin: Secondary | ICD-10-CM | POA: Insufficient documentation

## 2020-03-20 DIAGNOSIS — C541 Malignant neoplasm of endometrium: Secondary | ICD-10-CM | POA: Diagnosis not present

## 2020-03-20 DIAGNOSIS — R000 Tachycardia, unspecified: Secondary | ICD-10-CM | POA: Insufficient documentation

## 2020-03-20 DIAGNOSIS — E785 Hyperlipidemia, unspecified: Secondary | ICD-10-CM | POA: Insufficient documentation

## 2020-03-20 DIAGNOSIS — I10 Essential (primary) hypertension: Secondary | ICD-10-CM | POA: Insufficient documentation

## 2020-03-20 DIAGNOSIS — Z79899 Other long term (current) drug therapy: Secondary | ICD-10-CM | POA: Diagnosis not present

## 2020-03-20 DIAGNOSIS — M810 Age-related osteoporosis without current pathological fracture: Secondary | ICD-10-CM | POA: Diagnosis not present

## 2020-03-20 MED ORDER — OXYCODONE HCL 5 MG PO TABS: 5.0000 mg | ORAL_TABLET | Freq: Four times a day (QID) | ORAL | 0 refills | Status: DC | PRN

## 2020-03-20 MED ORDER — SENNOSIDES-DOCUSATE SODIUM 8.6-50 MG PO TABS: 2.0000 | ORAL_TABLET | Freq: Every day | ORAL | 0 refills | Status: DC

## 2020-03-20 MED ORDER — IBUPROFEN 600 MG PO TABS: 600.0000 mg | ORAL_TABLET | Freq: Four times a day (QID) | ORAL | 0 refills | Status: DC | PRN

## 2020-03-20 NOTE — Patient Instructions (Signed)
Preparing for your Surgery  Plan for surgery on April 01, 2020 with Dr. Everitt Amber at [facility]. You will be scheduled for a robotic assisted total laparoscopic hysterectomy (removal of the uterus and cervix), bilateral salpingo-oophorectomy (removal of both ovaries and fallopian tubes), sentinel lymph node biopsy, possible lymph node dissection.   Pre-operative Testing -You will receive a phone call from presurgical testing at [facility] to arrange for a pre-operative appointment, lab appointment, and COVID test. The COVID test normally happens 3 days prior to the surgery and they ask that you self quarantine after the test up until surgery to decrease chance of exposure.  -Bring your insurance card, copy of an advanced directive if applicable, medication list  -At that visit, you will be asked to sign a consent for a possible blood transfusion in case a transfusion becomes necessary during surgery.  The need for a blood transfusion is rare but having consent is a necessary part of your care.     -STOP YOUR ASPIRIN NOW  -Do not take supplements such as fish oil (omega 3), red yeast rice, turmeric before your surgery. Stop your omega 3 now.  Day Before Surgery at [place] will be asked to take in a light diet the day before surgery. You will be advised you can have clear liquids up until 3 hours before your surgery.    Eat a light diet the day before surgery.  Examples including soups, broths, toast, yogurt, mashed potatoes.  AVOID GAS PRODUCING FOODS. Things to avoid include carbonated beverages (fizzy beverages), raw fruits and raw vegetables, or beans.   If your bowels are filled with gas, your surgeon will have difficulty visualizing your pelvic organs which increases your surgical risks.  Your role in recovery Your role is to become active as soon as directed by your doctor, while still giving yourself time to heal.  Rest when you feel tired. You will  be asked to do the following in order to speed your recovery:  - Cough and breathe deeply. This helps to clear and expand your lungs and can prevent pneumonia after surgery.  - [place]. Do mild physical activity. Walking or moving your legs help your circulation and body functions return to normal. Do not try to get up or walk alone the first time after surgery.   -If you develop swelling on one leg or the other, pain in the back of your leg, redness/warmth in one of your legs, please call the office or go to the Emergency Room to have a doppler to rule out a blood clot. For shortness of breath, chest pain-seek care in the Emergency Room as soon as possible. - Actively manage your pain. Managing your pain lets you move in comfort. We will ask you to rate your pain on a scale of zero to 10. It is your responsibility to tell your doctor or nurse where and how much you hurt so your pain can be treated.  Special Considerations  -Your final pathology results from surgery should be available around one week after surgery and the results will be relayed to you when available.  -FMLA forms can be faxed to [phone] and please allow 5-7 business days for completion.  Pain Management After Surgery -You have been prescribed your pain medication and bowel regimen medications before surgery so that you can have these available when you are discharged from the hospital. The pain medication is for use ONLY  AFTER surgery and a new prescription will not be given.   -Make sure that you have Tylenol and Ibuprofen at home to use on a regular basis after surgery for pain control. We recommend alternating the medications every hour to six hours since they work differently and are processed in the body differently for pain relief.  -Review the attached handout on narcotic use and their risks and side effects.   Bowel Regimen -You have been prescribed Sennakot-S to take nightly to prevent  constipation especially if you are taking the narcotic pain medication intermittently.  It is important to prevent constipation and drink adequate amounts of liquids. You can stop taking this medication when you are not taking pain medication and you are back on your normal bowel routine.  Risks of Surgery Risks of surgery are low but include bleeding, infection, damage to surrounding structures, re-operation, blood clots, and very rarely death.   Blood Transfusion Information (For the consent to be signed before surgery)  We will be checking your blood type before surgery so in case of emergencies, we will know what type of blood you would need.                                            WHAT IS A BLOOD TRANSFUSION?  A transfusion is the replacement of blood or some of its parts. Blood is made up of multiple cells which provide different functions.  Red blood cells carry oxygen and are used for blood loss replacement.  White blood cells fight against infection.  Platelets control bleeding.  Plasma helps clot blood.  Other blood products are available for specialized needs, such as hemophilia or other clotting disorders. BEFORE THE TRANSFUSION  Who gives blood for transfusions?   You may be able to donate blood to be used at a later date on yourself (autologous donation).  Relatives can be asked to donate blood. This is generally not any safer than if you have received blood from a stranger. The same precautions are taken to ensure safety when a relative's blood is donated.  Healthy volunteers who are fully evaluated to make sure their blood is safe. This is blood bank blood. Transfusion therapy is the safest it has ever been in the practice of medicine. Before blood is taken from a donor, a complete history is taken to make sure that person has no history of diseases nor engages in risky social behavior (examples are intravenous drug use or sexual activity with multiple partners). The  donor's travel history is screened to minimize risk of transmitting infections, such as malaria. The donated blood is tested for signs of infectious diseases, such as HIV and hepatitis. The blood is then tested to be sure it is compatible with you in order to minimize the chance of a transfusion reaction. If you or a relative donates blood, this is often done in anticipation of surgery and is not appropriate for emergency situations. It takes many days to process the donated blood. RISKS AND COMPLICATIONS Although transfusion therapy is very safe and saves many lives, the main dangers of transfusion include:   Getting an infectious disease.  Developing a transfusion reaction. This is an allergic reaction to something in the blood you were given. Every precaution is taken to prevent this. The decision to have a blood transfusion has been considered carefully by your caregiver before blood  is given. Blood is not given unless the benefits outweigh the risks.  AFTER SURGERY INSTRUCTIONS  Return to work: 4 weeks if applicable  Activity: 1. Be up and out of the bed during the day.  Take a nap if needed.  You may walk up steps but be careful and use the hand rail.  Stair climbing will tire you more than you think, you may need to stop part way and rest.   2. No lifting or straining for 6 weeks over 10 pounds. No pushing, pulling, straining for 6 weeks.  3. No driving for 1 week(s).  Do not drive if you are taking narcotic pain medicine and make sure that your reaction time has returned.   4. You can shower as soon as the next day after surgery. Shower daily.  Use your regular soap and water (not directly on the incision) and pat your incision(s) dry afterwards; don't rub.  No tub baths or submerging your body in water until cleared by your surgeon. If you have the soap that was given to you by pre-surgical testing that was used before surgery, you do not need to use it afterwards because this can  irritate your incisions.   5. No sexual activity and nothing in the vagina for 8 weeks.  6. You may experience a small amount of clear drainage from your incisions, which is normal.  If the drainage persists, increases, or changes color please call the office.  7. Do not use creams, lotions, or ointments such as neosporin on your incisions after surgery until advised by your surgeon because they can cause removal of the dermabond glue on your incisions.    8. You may experience vaginal spotting after surgery or around the 6-8 week mark from surgery when the stitches at the top of the vagina begin to dissolve.  The spotting is normal but if you experience heavy bleeding, call our office.  9. Take Tylenol or ibuprofen first for pain and only use narcotic pain medication for severe pain not relieved by the Tylenol or Ibuprofen.  Monitor your Tylenol intake to a max of 4,000 mg in a 24 hour period. You can alternate these medications after surgery.  Diet: 1. Low sodium Heart Healthy Diet is recommended but you are cleared to resume your normal (before surgery) diet after your procedure.  2. It is safe to use a laxative, such as Miralax or Colace, if you have difficulty moving your bowels. You have been prescribed Sennakot at bedtime every evening to keep bowel movements regular and to prevent constipation.    Wound Care: 1. Keep clean and dry.  Shower daily.  Reasons to call the Doctor:  Fever - Oral temperature greater than 100.4 degrees Fahrenheit  Foul-smelling vaginal discharge  Difficulty urinating  Nausea and vomiting  Increased pain at the site of the incision that is unrelieved with pain medicine.  Difficulty breathing with or without chest pain  New calf pain especially if only on one side  Sudden, continuing increased vaginal bleeding with or without clots.   Contacts: For questions or concerns you should contact:  Dr. Everitt Amber at [phone]  Joylene John, NP at  [phone]  After Hours: call [phone] and have the GYN Oncologist paged/contacted (after 5 pm or on the weekends)
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
## 2020-03-23 NOTE — Progress Notes (Addendum)
COVID Vaccine Completed:  x3 Date COVID Vaccine completed:  2nd dose May 2021.  Booster November 2021 COVID vaccine manufacturer: [place]   PCP - Yaakov Guthrie, MD Cardiologist - Daneen Schick, MD  Chest x-ray -  EKG - 08-13-19 in Epic Stress Test - 01-16-15 in Epic ECHO - 04-09-13 in Epic Cardiac Cath -  Pacemaker/ICD device last checked: Coronary CT - 05-29-18 in Epic  Sleep Study -  CPAP -   Fasting Blood Sugar -  Checks Blood Sugar _____ times a day  Blood Thinner Instructions: Aspirin Instructions:   Last Dose:  Anesthesia review: Thoracic ascending aortic aneurysm, coronary artery calcification, tachycardia, HTN  Patient denies shortness of breath, fever, cough and chest pain at PAT appointment.  Patient walks 5 miles daily, able to climb stairs.  Able to perform ADL's without assistance.   Patient verbalized understanding of instructions that were given to them at the PAT appointment. Patient was also instructed that they will need to review over the PAT instructions again at home before surgery.

## 2020-03-23 NOTE — Patient Instructions (Addendum)
DUE TO COVID-19 ONLY ONE VISITOR IS ALLOWED IN WAITING ROOM (VISITOR WILL HAVE A TEMPERATURE CHECK ON ARRIVAL AND MUST WEAR A FACE MASK THE ENTIRE TIME.)  ONCE YOU ARE ADMITTED TO YOUR PRIVATE ROOM, THE SAME ONE VISITOR IS ALLOWED TO VISIT DURING VISITING HOURS ONLY.  Your COVID swab testing is scheduled for Saturday, 03-28-20 at 10:10 AM,   You must self quarantine after your testing per handout given to you at the testing site. [address] [place], [place] [address]  (Must self quarantine after testing. Follow instructions on handout.)       Your procedure is scheduled on:  Wednesday, 04-01-20  Report to [place] AT 10:00  A. M.   Call this number if you have problems the morning of [phone].   OUR ADDRESS IS [address].  WE ARE LOCATED IN [facility].                                     REMEMBER:   LIGHT DIET DAY BEFORE SURGERY, AVOID GAS PRODUCING FOODS   DO NOT EAT FOOD AFTER MIDNIGHT .   YOU MAY HAVE CLEAR LIQUIDS FROM MIDNIGHT UNTIL 9:00 AM THE DAY OF SURGERY  CLEAR LIQUID DIET  Foods Allowed                                                                     Foods Excluded  Water, Black Coffee and tea, regular and decaf              liquids that you cannot  Plain Jell-O in any flavor  (No red)                                     see through such as: Fruit ices (not with fruit pulp)                                      milk, soups, orange juice  Iced Popsicles (No red)                                     All solid food                                   Apple juices Sports drinks like Gatorade (No red) Lightly seasoned clear broth or consume(fat free) Sugar, honey syrup  BRUSH YOUR TEETH THE MORNING OF SURGERY.  TAKE THESE MEDICATIONS MORNING OF SURGERY WITH A SIP OF WATER:  Amlodipine, Metoprolol  DO NOT WEAR JEWERLY, MAKE UP, OR NAIL POLISH.  DO NOT WEAR LOTIONS, POWDERS, PERFUMES/COLOGNE OR  DEODORANT.  DO NOT SHAVE FOR 24 HOURS PRIOR TO DAY OF SURGERY.  CONTACTS, GLASSES, OR DENTURES MAY NOT BE WORN TO SURGERY.  [facility] IS NOT RESPONSIBLE  FOR ANY BELONGINGS.                                                                     WHAT IS A BLOOD TRANSFUSION? Blood Transfusion Information  A transfusion is the replacement of blood or some of its parts. Blood is made up of multiple cells which provide different functions.  Red blood cells carry oxygen and are used for blood loss replacement.  White blood cells fight against infection.  Platelets control bleeding.  Plasma helps clot blood.  Other blood products are available for specialized needs, such as hemophilia or other clotting disorders. BEFORE THE TRANSFUSION  Who gives blood for transfusions?   Healthy volunteers who are fully evaluated to make sure their blood is safe. This is blood bank blood. Transfusion therapy is the safest it has ever been in the practice of medicine. Before blood is taken from a donor, a complete history is taken to make sure that person has no history of diseases nor engages in risky social behavior (examples are intravenous drug use or sexual activity with multiple partners). The donor's travel history is screened to minimize risk of transmitting infections, such as malaria. The donated blood is tested for signs of infectious diseases, such as HIV and hepatitis. The blood is then tested to be sure it is compatible with you in order to minimize the chance of a transfusion reaction. If you or a relative donates blood, this is often done in anticipation of surgery and is not appropriate for emergency situations. It takes many days to process the donated blood. RISKS AND COMPLICATIONS Although transfusion therapy is very safe and saves many lives, the main dangers of transfusion include:   Getting an infectious disease.  Developing a transfusion reaction.  This is an allergic reaction to something in the blood you were given. Every precaution is taken to prevent this. The decision to have a blood transfusion has been considered carefully by your caregiver before blood is given. Blood is not given unless the benefits outweigh the risks. AFTER THE TRANSFUSION  Right after receiving a blood transfusion, you will usually feel much better and more energetic. This is especially true if your red blood cells have gotten low (anemic). The transfusion raises the level of the red blood cells which carry oxygen, and this usually causes an energy increase.  The nurse administering the transfusion will monitor you carefully for complications. HOME CARE INSTRUCTIONS  No special instructions are needed after a transfusion. You may find your energy is better. Speak with your caregiver about any limitations on activity for underlying diseases you may have. SEEK MEDICAL CARE IF:   Your condition is not improving after your transfusion.  You develop redness or irritation at the intravenous (IV) site. SEEK IMMEDIATE MEDICAL CARE IF:  Any of the following symptoms occur over the next 12 hours:  Shaking chills.  You have a temperature by mouth above 102 F (38.9 C), not controlled by medicine.  Chest, back, or muscle pain.  People around you feel you are not acting correctly or are confused.  Shortness of breath or difficulty breathing.  Dizziness and fainting.  You get a rash or develop hives.  You have a  decrease in urine output.  Your urine turns a dark color or changes to pink, red, or brown. Any of the following symptoms occur over the next 10 days:  You have a temperature by mouth above 102 F (38.9 C), not controlled by medicine.  Shortness of breath.  Weakness after normal activity.  The white part of the eye turns yellow (jaundice).  You have a decrease in the amount of urine or are urinating less often.  Your urine turns a dark color  or changes to pink, red, or brown. Document Released: 04/15/2000 Document Revised: 07/11/2011 Document Reviewed: 12/03/2007 [facility] Patient Information 2014 [place], [place].  _______________________________________________________________________

## 2020-03-24 ENCOUNTER — Encounter (HOSPITAL_COMMUNITY): Payer: Self-pay

## 2020-03-24 ENCOUNTER — Encounter (HOSPITAL_COMMUNITY)
Admission: RE | Admit: 2020-03-24 | Discharge: 2020-03-24 | Disposition: A | Discharge status: Home / Self Care | Payer: Medicare PPO | Source: Ambulatory Visit | Attending: Gynecologic Oncology | Admitting: Gynecologic Oncology

## 2020-03-24 ENCOUNTER — Telehealth: Payer: Self-pay | Admitting: *Deleted

## 2020-03-24 ENCOUNTER — Other Ambulatory Visit (HOSPITAL_COMMUNITY): Payer: Medicare PPO

## 2020-03-24 ENCOUNTER — Other Ambulatory Visit: Payer: Self-pay

## 2020-03-24 DIAGNOSIS — Z01812 Encounter for preprocedural laboratory examination: Secondary | ICD-10-CM | POA: Diagnosis not present

## 2020-03-24 LAB — URINALYSIS, ROUTINE W REFLEX MICROSCOPIC
Bilirubin Urine: NEGATIVE
Glucose, UA: NEGATIVE mg/dL
Ketones, ur: NEGATIVE mg/dL
Leukocytes,Ua: NEGATIVE
Nitrite: NEGATIVE
Protein, ur: NEGATIVE mg/dL
Specific Gravity, Urine: 1.015 (ref 1.005–1.030)
pH: 5 (ref 5.0–8.0)

## 2020-03-24 LAB — BASIC METABOLIC PANEL
Anion gap: 8 (ref 5–15)
BUN: 18 mg/dL (ref 8–23)
CO2: 27 mmol/L (ref 22–32)
Calcium: 10 mg/dL (ref 8.9–10.3)
Chloride: 104 mmol/L (ref 98–111)
Creatinine, Ser: 0.63 mg/dL (ref 0.44–1.00)
GFR, Estimated: >60 mL/min (ref >60)
Glucose, Bld: 102 mg/dL — ABNORMAL HIGH (ref 70–99)
Potassium: 4.7 mmol/L (ref 3.5–5.1)
Sodium: 139 mmol/L (ref 135–145)

## 2020-03-24 LAB — CBC
HCT: 43.4 % (ref 36.0–46.0)
Hemoglobin: 14.3 g/dL (ref 12.0–15.0)
MCH: 31.8 pg (ref 26.0–34.0)
MCHC: 32.9 g/dL (ref 30.0–36.0)
MCV: 96.4 fL (ref 80.0–100.0)
Platelets: 263 10*3/uL (ref 150–400)
RBC: 4.5 MIL/uL (ref 3.87–5.11)
RDW: 13 % (ref 11.5–15.5)
WBC: 7.8 10*3/uL (ref 4.0–10.5)
nRBC: 0 % (ref 0.0–0.2)

## 2020-03-24 NOTE — Telephone Encounter (Signed)
Patient called and left a message stating "I just left my pre op appt and they talked about my uterus, cervix and ovaries being removed. Nothing was said about the tubes. I wanted to make sure that everything that may cause a problem for me is taking out. Please let Dr Denman George know." Called the patient back and left a message to call the office back.

## 2020-03-25 NOTE — Telephone Encounter (Signed)
Returned patient's call and explained that Dr Denman George will be moving her fallopian tubes

## 2020-03-28 ENCOUNTER — Other Ambulatory Visit (HOSPITAL_COMMUNITY)
Admission: RE | Admit: 2020-03-28 | Discharge: 2020-03-28 | Disposition: A | Discharge status: Home / Self Care | Payer: Medicare PPO | Source: Ambulatory Visit | Attending: Gynecologic Oncology | Admitting: Gynecologic Oncology

## 2020-03-28 DIAGNOSIS — Z20822 Contact with and (suspected) exposure to covid-19: Secondary | ICD-10-CM | POA: Insufficient documentation

## 2020-03-28 DIAGNOSIS — Z01812 Encounter for preprocedural laboratory examination: Secondary | ICD-10-CM | POA: Diagnosis not present

## 2020-03-29 LAB — SARS CORONAVIRUS 2 (TAT 6-24 HRS): SARS Coronavirus 2: NEGATIVE

## 2020-03-31 ENCOUNTER — Telehealth: Payer: Self-pay

## 2020-03-31 NOTE — Telephone Encounter (Signed)
Ms Whisnant states that she understands her written pre-op instructions.  She has not taken her fish oil and ASA  Since 03-20-20.

## 2020-04-01 ENCOUNTER — Encounter (HOSPITAL_BASED_OUTPATIENT_CLINIC_OR_DEPARTMENT_OTHER): Payer: Self-pay | Admitting: Gynecologic Oncology

## 2020-04-01 ENCOUNTER — Encounter (HOSPITAL_BASED_OUTPATIENT_CLINIC_OR_DEPARTMENT_OTHER)
Admission: RE | Disposition: A | Discharge status: Home / Self Care | Payer: Self-pay | Source: Home / Self Care | Attending: Gynecologic Oncology

## 2020-04-01 ENCOUNTER — Ambulatory Visit (HOSPITAL_BASED_OUTPATIENT_CLINIC_OR_DEPARTMENT_OTHER)
Admission: RE | Admit: 2020-04-01 | Discharge: 2020-04-01 | Disposition: A | Discharge status: Home / Self Care | Payer: Medicare PPO | Attending: Gynecologic Oncology | Admitting: Gynecologic Oncology

## 2020-04-01 ENCOUNTER — Ambulatory Visit (HOSPITAL_BASED_OUTPATIENT_CLINIC_OR_DEPARTMENT_OTHER): Payer: Medicare PPO | Admitting: Physician Assistant

## 2020-04-01 ENCOUNTER — Ambulatory Visit (HOSPITAL_BASED_OUTPATIENT_CLINIC_OR_DEPARTMENT_OTHER): Payer: Medicare PPO | Admitting: Anesthesiology

## 2020-04-01 ENCOUNTER — Other Ambulatory Visit: Payer: Self-pay

## 2020-04-01 DIAGNOSIS — Z7982 Long term (current) use of aspirin: Secondary | ICD-10-CM | POA: Insufficient documentation

## 2020-04-01 DIAGNOSIS — Z79810 Long term (current) use of selective estrogen receptor modulators (SERMs): Secondary | ICD-10-CM | POA: Insufficient documentation

## 2020-04-01 DIAGNOSIS — I10 Essential (primary) hypertension: Secondary | ICD-10-CM | POA: Insufficient documentation

## 2020-04-01 DIAGNOSIS — Z86010 Personal history of colonic polyps: Secondary | ICD-10-CM | POA: Diagnosis not present

## 2020-04-01 DIAGNOSIS — Z8719 Personal history of other diseases of the digestive system: Secondary | ICD-10-CM | POA: Insufficient documentation

## 2020-04-01 DIAGNOSIS — D259 Leiomyoma of uterus, unspecified: Secondary | ICD-10-CM | POA: Diagnosis not present

## 2020-04-01 DIAGNOSIS — Z8249 Family history of ischemic heart disease and other diseases of the circulatory system: Secondary | ICD-10-CM | POA: Insufficient documentation

## 2020-04-01 DIAGNOSIS — Z170 Estrogen receptor positive status [ER+]: Secondary | ICD-10-CM | POA: Diagnosis not present

## 2020-04-01 DIAGNOSIS — Z882 Allergy status to sulfonamides status: Secondary | ICD-10-CM | POA: Insufficient documentation

## 2020-04-01 DIAGNOSIS — Z853 Personal history of malignant neoplasm of breast: Secondary | ICD-10-CM | POA: Insufficient documentation

## 2020-04-01 DIAGNOSIS — Z9011 Acquired absence of right breast and nipple: Secondary | ICD-10-CM | POA: Diagnosis not present

## 2020-04-01 DIAGNOSIS — C50211 Malignant neoplasm of upper-inner quadrant of right female breast: Secondary | ICD-10-CM | POA: Diagnosis not present

## 2020-04-01 DIAGNOSIS — C541 Malignant neoplasm of endometrium: Secondary | ICD-10-CM | POA: Diagnosis not present

## 2020-04-01 DIAGNOSIS — Z8049 Family history of malignant neoplasm of other genital organs: Secondary | ICD-10-CM | POA: Diagnosis not present

## 2020-04-01 DIAGNOSIS — Z79899 Other long term (current) drug therapy: Secondary | ICD-10-CM | POA: Insufficient documentation

## 2020-04-01 DIAGNOSIS — Z9221 Personal history of antineoplastic chemotherapy: Secondary | ICD-10-CM | POA: Insufficient documentation

## 2020-04-01 DIAGNOSIS — Z886 Allergy status to analgesic agent status: Secondary | ICD-10-CM | POA: Insufficient documentation

## 2020-04-01 DIAGNOSIS — Z807 Family history of other malignant neoplasms of lymphoid, hematopoietic and related tissues: Secondary | ICD-10-CM | POA: Diagnosis not present

## 2020-04-01 DIAGNOSIS — D251 Intramural leiomyoma of uterus: Secondary | ICD-10-CM | POA: Diagnosis not present

## 2020-04-01 HISTORY — PX: SENTINEL NODE BIOPSY: SHX6608

## 2020-04-01 HISTORY — PX: ROBOTIC ASSISTED TOTAL HYSTERECTOMY WITH BILATERAL SALPINGO OOPHERECTOMY: SHX6086

## 2020-04-01 LAB — TYPE AND SCREEN
ABO/RH(D): O POS
Antibody Screen: NEGATIVE

## 2020-04-01 LAB — ABO/RH: ABO/RH(D): O POS

## 2020-04-01 SURGERY — HYSTERECTOMY, TOTAL, ROBOT-ASSISTED, LAPAROSCOPIC, WITH BILATERAL SALPINGO-OOPHORECTOMY
Anesthesia: General | Site: Abdomen

## 2020-04-01 MED ORDER — CEFAZOLIN SODIUM-DEXTROSE 2-4 GM/100ML-% IV SOLN
INTRAVENOUS | Status: AC
  Filled 2020-04-01: qty 100

## 2020-04-01 MED ORDER — FENTANYL CITRATE (PF) 100 MCG/2ML IJ SOLN
INTRAMUSCULAR | Status: AC
  Filled 2020-04-01: qty 2

## 2020-04-01 MED ORDER — GABAPENTIN 100 MG PO CAPS
200.0000 mg | ORAL_CAPSULE | ORAL | Status: AC
  Administered 2020-04-01: 200 mg via ORAL

## 2020-04-01 MED ORDER — OXYCODONE HCL 5 MG/5ML PO SOLN: 5.0000 mg | Freq: Once | ORAL | Status: DC | PRN

## 2020-04-01 MED ORDER — DEXAMETHASONE SODIUM PHOSPHATE 10 MG/ML IJ SOLN
INTRAMUSCULAR | Status: DC | PRN
  Administered 2020-04-01: 4 mg via INTRAVENOUS

## 2020-04-01 MED ORDER — ENOXAPARIN SODIUM 40 MG/0.4ML ~~LOC~~ SOLN
SUBCUTANEOUS | Status: AC
  Filled 2020-04-01: qty 0.4

## 2020-04-01 MED ORDER — ORAL CARE MOUTH RINSE: 15.0000 mL | Freq: Once | OROMUCOSAL | Status: DC

## 2020-04-01 MED ORDER — DEXMEDETOMIDINE (PRECEDEX) IN NS 20 MCG/5ML (4 MCG/ML) IV SYRINGE
PREFILLED_SYRINGE | INTRAVENOUS | Status: AC
  Filled 2020-04-01: qty 5

## 2020-04-01 MED ORDER — ROCURONIUM BROMIDE 10 MG/ML (PF) SYRINGE
PREFILLED_SYRINGE | INTRAVENOUS | Status: AC
  Filled 2020-04-01: qty 10

## 2020-04-01 MED ORDER — FENTANYL CITRATE (PF) 100 MCG/2ML IJ SOLN
INTRAMUSCULAR | Status: DC | PRN
  Administered 2020-04-01: 50 ug via INTRAVENOUS

## 2020-04-01 MED ORDER — DEXAMETHASONE SODIUM PHOSPHATE 10 MG/ML IJ SOLN: 4.0000 mg | INTRAMUSCULAR | Status: DC

## 2020-04-01 MED ORDER — LIDOCAINE 2% (20 MG/ML) 5 ML SYRINGE
INTRAMUSCULAR | Status: DC | PRN
  Administered 2020-04-01: 60 mg via INTRAVENOUS

## 2020-04-01 MED ORDER — ONDANSETRON HCL 4 MG/2ML IJ SOLN: 4.0000 mg | Freq: Once | INTRAMUSCULAR | Status: DC | PRN

## 2020-04-01 MED ORDER — GABAPENTIN 100 MG PO CAPS
ORAL_CAPSULE | ORAL | Status: AC
  Filled 2020-04-01: qty 2

## 2020-04-01 MED ORDER — PROPOFOL 10 MG/ML IV BOLUS
INTRAVENOUS | Status: DC | PRN
  Administered 2020-04-01: 120 mg via INTRAVENOUS

## 2020-04-01 MED ORDER — FENTANYL CITRATE (PF) 250 MCG/5ML IJ SOLN
INTRAMUSCULAR | Status: AC
  Filled 2020-04-01: qty 5

## 2020-04-01 MED ORDER — ENOXAPARIN SODIUM 40 MG/0.4ML ~~LOC~~ SOLN
40.0000 mg | SUBCUTANEOUS | Status: AC
  Administered 2020-04-01: 40 mg via SUBCUTANEOUS

## 2020-04-01 MED ORDER — ONDANSETRON HCL 4 MG/2ML IJ SOLN
INTRAMUSCULAR | Status: DC | PRN
  Administered 2020-04-01: 4 mg via INTRAVENOUS

## 2020-04-01 MED ORDER — ROCURONIUM BROMIDE 10 MG/ML (PF) SYRINGE
PREFILLED_SYRINGE | INTRAVENOUS | Status: DC | PRN
  Administered 2020-04-01 (×2): 10 mg via INTRAVENOUS
  Administered 2020-04-01: 40 mg via INTRAVENOUS

## 2020-04-01 MED ORDER — ACETAMINOPHEN 500 MG PO TABS
ORAL_TABLET | ORAL | Status: AC
  Filled 2020-04-01: qty 2

## 2020-04-01 MED ORDER — FENTANYL CITRATE (PF) 100 MCG/2ML IJ SOLN
25.0000 ug | INTRAMUSCULAR | Status: DC | PRN
  Administered 2020-04-01 (×2): 25 ug via INTRAVENOUS

## 2020-04-01 MED ORDER — ACETAMINOPHEN 500 MG PO TABS
1000.0000 mg | ORAL_TABLET | ORAL | Status: AC
  Administered 2020-04-01: 1000 mg via ORAL

## 2020-04-01 MED ORDER — ONDANSETRON HCL 4 MG/2ML IJ SOLN
INTRAMUSCULAR | Status: AC
  Filled 2020-04-01: qty 2

## 2020-04-01 MED ORDER — CHLORHEXIDINE GLUCONATE 0.12 % MT SOLN: 15.0000 mL | Freq: Once | OROMUCOSAL | Status: DC

## 2020-04-01 MED ORDER — DEXAMETHASONE SODIUM PHOSPHATE 10 MG/ML IJ SOLN
INTRAMUSCULAR | Status: AC
  Filled 2020-04-01: qty 1

## 2020-04-01 MED ORDER — LACTATED RINGERS IV SOLN: INTRAVENOUS | Status: DC

## 2020-04-01 MED ORDER — BUPIVACAINE HCL 0.25 % IJ SOLN
INTRAMUSCULAR | Status: DC | PRN
  Administered 2020-04-01: 17 mL

## 2020-04-01 MED ORDER — PROPOFOL 10 MG/ML IV BOLUS
INTRAVENOUS | Status: AC
  Filled 2020-04-01: qty 40

## 2020-04-01 MED ORDER — OXYCODONE HCL 5 MG PO TABS: 5.0000 mg | ORAL_TABLET | Freq: Once | ORAL | Status: DC | PRN

## 2020-04-01 MED ORDER — LIDOCAINE HCL (PF) 2 % IJ SOLN
INTRAMUSCULAR | Status: AC
  Filled 2020-04-01: qty 5

## 2020-04-01 MED ORDER — CEFAZOLIN SODIUM-DEXTROSE 2-4 GM/100ML-% IV SOLN
2.0000 g | INTRAVENOUS | Status: AC
  Administered 2020-04-01: 2 g via INTRAVENOUS

## 2020-04-01 MED ORDER — STERILE WATER FOR INJECTION IJ SOLN
INTRAMUSCULAR | Status: DC | PRN
  Administered 2020-04-01: 10 mL

## 2020-04-01 MED ORDER — PHENYLEPHRINE 40 MCG/ML (10ML) SYRINGE FOR IV PUSH (FOR BLOOD PRESSURE SUPPORT)
PREFILLED_SYRINGE | INTRAVENOUS | Status: DC | PRN
  Administered 2020-04-01: 120 ug via INTRAVENOUS

## 2020-04-01 MED ORDER — AMISULPRIDE (ANTIEMETIC) 5 MG/2ML IV SOLN: 10.0000 mg | Freq: Once | INTRAVENOUS | Status: DC | PRN

## 2020-04-01 MED ORDER — DEXMEDETOMIDINE (PRECEDEX) IN NS 20 MCG/5ML (4 MCG/ML) IV SYRINGE
PREFILLED_SYRINGE | INTRAVENOUS | Status: DC | PRN
  Administered 2020-04-01: 8 ug via INTRAVENOUS

## 2020-04-01 MED ORDER — SODIUM CHLORIDE 0.9 % IR SOLN
Status: DC | PRN
  Administered 2020-04-01: 3000 mL

## 2020-04-01 MED ORDER — SUGAMMADEX SODIUM 200 MG/2ML IV SOLN
INTRAVENOUS | Status: DC | PRN
  Administered 2020-04-01: 200 mg via INTRAVENOUS

## 2020-04-01 SURGICAL SUPPLY — 55 items
ADH SKN CLS APL DERMABOND .7 (GAUZE/BANDAGES/DRESSINGS) ×2
BAG SPEC RTRVL LRG 6X4 10 (ENDOMECHANICALS) ×2
COVER BACK TABLE 60X90IN (DRAPES) ×3 IMPLANT
COVER TIP SHEARS 8 DVNC (MISCELLANEOUS) ×2 IMPLANT
COVER TIP SHEARS 8MM DA VINCI (MISCELLANEOUS) ×3
COVER WAND RF STERILE (DRAPES) ×3 IMPLANT
DECANTER SPIKE VIAL GLASS SM (MISCELLANEOUS) ×2 IMPLANT
DERMABOND ADVANCED (GAUZE/BANDAGES/DRESSINGS) ×1
DERMABOND ADVANCED .7 DNX12 (GAUZE/BANDAGES/DRESSINGS) ×2 IMPLANT
DRAPE ARM DVNC X/XI (DISPOSABLE) ×8 IMPLANT
DRAPE COLUMN DVNC XI (DISPOSABLE) ×2 IMPLANT
DRAPE DA VINCI XI ARM (DISPOSABLE) ×12
DRAPE DA VINCI XI COLUMN (DISPOSABLE) ×3
DRAPE SHEET LG 3/4 BI-LAMINATE (DRAPES) ×3 IMPLANT
DRAPE SURG IRRIG POUCH 19X23 (DRAPES) ×3 IMPLANT
ELECT REM PT RETURN 9FT ADLT (ELECTROSURGICAL) ×3
ELECTRODE REM PT RTRN 9FT ADLT (ELECTROSURGICAL) ×2 IMPLANT
GAUZE 4X4 16PLY RFD (DISPOSABLE) ×3 IMPLANT
GLOVE BIO SURGEON STRL SZ 6 (GLOVE) ×12 IMPLANT
GLOVE BIO SURGEON STRL SZ 6.5 (GLOVE) ×6 IMPLANT
GLOVE BIO SURGEON STRL SZ7 (GLOVE) ×2 IMPLANT
GLOVE BIOGEL PI IND STRL 7.5 (GLOVE) IMPLANT
GLOVE BIOGEL PI INDICATOR 7.5 (GLOVE) ×2
GLOVE ECLIPSE 7.5 STRL STRAW (GLOVE) ×2 IMPLANT
GLOVE SURG UNDER POLY LF SZ7 (GLOVE) ×1 IMPLANT
HOLDER FOLEY CATH W/STRAP (MISCELLANEOUS) ×3 IMPLANT
IRRIG SUCT STRYKERFLOW 2 WTIP (MISCELLANEOUS) ×3
IRRIGATION SUCT STRKRFLW 2 WTP (MISCELLANEOUS) ×2 IMPLANT
KIT PROCEDURE DA VINCI SI (MISCELLANEOUS) ×3
KIT PROCEDURE DVNC SI (MISCELLANEOUS) IMPLANT
KIT TURNOVER CYSTO (KITS) ×3 IMPLANT
LEGGING LITHOTOMY PAIR STRL (DRAPES) ×3 IMPLANT
MANIPULATOR UTERINE 4.5 ZUMI (MISCELLANEOUS) ×3 IMPLANT
NDL SPNL 18GX3.5 QUINCKE PK (NEEDLE) IMPLANT
NEEDLE HYPO 22GX1.5 SAFETY (NEEDLE) ×3 IMPLANT
NEEDLE SPNL 18GX3.5 QUINCKE PK (NEEDLE) ×3 IMPLANT
NS IRRIG 500ML POUR BTL (IV SOLUTION) ×1 IMPLANT
OBTURATOR OPTICAL STANDARD 8MM (TROCAR) ×3
OBTURATOR OPTICAL STND 8 DVNC (TROCAR) ×2
OBTURATOR OPTICALSTD 8 DVNC (TROCAR) ×2 IMPLANT
OCCLUDER COLPOPNEUMO (BALLOONS) ×1 IMPLANT
PACK ROBOT GYN CUSTOM WL (TRAY / TRAY PROCEDURE) ×3 IMPLANT
PACK ROBOTIC GOWN (GOWN DISPOSABLE) ×3 IMPLANT
PAD POSITIONING PINK XL (MISCELLANEOUS) ×3 IMPLANT
PORT ACCESS TROCAR AIRSEAL 12 (TROCAR) ×2 IMPLANT
PORT ACCESS TROCAR AIRSEAL 5M (TROCAR) ×1
POUCH SPECIMEN RETRIEVAL 10MM (ENDOMECHANICALS) ×1 IMPLANT
SEAL CANN UNIV 5-8 DVNC XI (MISCELLANEOUS) ×6 IMPLANT
SEAL XI 5MM-8MM UNIVERSAL (MISCELLANEOUS) ×9
SET TRI-LUMEN FLTR TB AIRSEAL (TUBING) ×3 IMPLANT
SUT VIC AB 4-0 PS2 18 (SUTURE) ×6 IMPLANT
TRAY FOL W/BAG SLVR 16FR STRL (SET/KITS/TRAYS/PACK) ×2 IMPLANT
TRAY FOLEY W/BAG SLVR 16FR LF (SET/KITS/TRAYS/PACK) ×3
TUBE CONNECTING 12X1/4 (SUCTIONS) ×3 IMPLANT
UNDERPAD 30X36 HEAVY ABSORB (UNDERPADS AND DIAPERS) ×3 IMPLANT

## 2020-04-01 NOTE — Interval H&P Note (Signed)
History and Physical Interval Note:  04/01/2020 11:06 AM  Christina Daniel  has presented today for surgery, with the diagnosis of ENDOMETRIAL CANCER.  The various methods of treatment have been discussed with the patient and family. After consideration of risks, benefits and other options for treatment, the patient has consented to  Procedure(s): XI ROBOTIC ASSISTED TOTAL HYSTERECTOMY WITH BILATERAL SALPINGO OOPHORECTOMY (Bilateral) SENTINEL NODE BIOPSY (N/A) as a surgical intervention.  The patient's history has been reviewed, patient examined, no change in status, stable for surgery.  I have reviewed the patient's chart and labs.  Questions were answered to the patient's satisfaction.     Thereasa Solo

## 2020-04-01 NOTE — Anesthesia Preprocedure Evaluation (Addendum)
Anesthesia Evaluation  Patient identified by MRN, date of birth, ID band Patient awake    Reviewed: Allergy & Precautions, NPO status , Patient's Chart, lab work & pertinent test results, reviewed documented beta blocker date and time   History of Anesthesia Complications Negative for: history of anesthetic complications  Airway Mallampati: II  TM Distance: >3 FB Neck ROM: Full    Dental  (+) Teeth Intact   Pulmonary neg pulmonary ROS,    Pulmonary exam normal        Cardiovascular hypertension, Pt. on medications and Pt. on home beta blockers Normal cardiovascular exam+ dysrhythmias      Neuro/Psych Anxiety Depression negative neurological ROS     GI/Hepatic negative GI ROS, Neg liver ROS,   Endo/Other  negative endocrine ROS  Renal/GU negative Renal ROS  negative genitourinary   Musculoskeletal negative musculoskeletal ROS (+)   Abdominal   Peds  Hematology H/o breast cancer s/p chemo/XRT   Anesthesia Other Findings   Reproductive/Obstetrics Endometrial cancer                           Anesthesia Physical Anesthesia Plan  ASA: II  Anesthesia Plan: General   Post-op Pain Management:    Induction: Intravenous  PONV Risk Score and Plan: 4 or greater and Ondansetron, Dexamethasone, Treatment may vary due to age or medical condition and Midazolam  Airway Management Planned: Oral ETT  Additional Equipment: None  Intra-op Plan:   Post-operative Plan: Extubation in OR  Informed Consent: I have reviewed the patients History and Physical, chart, labs and discussed the procedure including the risks, benefits and alternatives for the proposed anesthesia with the patient or authorized representative who has indicated his/her understanding and acceptance.     Dental advisory given  Plan Discussed with:   Anesthesia Plan Comments:        Anesthesia Quick Evaluation

## 2020-04-01 NOTE — Transfer of Care (Signed)
Immediate Anesthesia Transfer of Care Note  Patient: AZURE BUDNICK  Procedure(s) Performed: XI ROBOTIC ASSISTED TOTAL HYSTERECTOMY WITH BILATERAL SALPINGO OOPHORECTOMY (Bilateral Abdomen) SENTINEL NODE BIOPSY, LEFT PELVIC LYMPHADENECTOMY (N/A Abdomen)  Patient Location: PACU  Anesthesia Type:General  Level of Consciousness: sedated  Airway & Oxygen Therapy: Patient Spontanous Breathing and Patient connected to nasal cannula oxygen  Post-op Assessment: Report given to RN  Post vital signs: Reviewed and stable  Last Vitals:  Vitals Value Taken Time  BP 112/66 04/01/20 1345  Temp    Pulse 65 04/01/20 1347  Resp 13 04/01/20 1347  SpO2 100 % 04/01/20 1347  Vitals shown include unvalidated device data.  Last Pain:  Vitals:   04/01/20 1028  TempSrc: Oral  PainSc: 0-No pain      Patients Stated Pain Goal: 5 (84/41/71 [id number])  Complications: No complications documented.

## 2020-04-01 NOTE — Discharge Instructions (Addendum)
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  Activity: 1. Be up and out of the bed during the day.  Take a nap if needed.  You may walk up steps but be careful and use the hand rail.  Stair climbing will tire you more than you think, you may need to stop part way and rest.   2. No lifting or straining for 6 weeks.  3. No driving for 1 week(s).  Do not drive if you are taking narcotic pain medicine.  4. Shower daily.  Use soap and water on your incision and pat dry; don't rub.  No tub baths until cleared by your surgeon.   5. No sexual activity and nothing in the vagina for 8 weeks.  6. You may experience a small amount of clear drainage from your incisions, which is normal.  If the drainage persists or increases, please call the office.  7. You may experience vaginal spotting after surgery or around the 6-8 week mark from surgery when the stitches at the top of the vagina begin to dissolve.  The spotting is normal but if you experience heavy bleeding, call our office.  8. Take Tylenol or ibuprofen first for pain and only use narcotic pain medication for severe pain not relieved by the Tylenol or Ibuprofen.  Monitor your Tylenol intake to a max of 4,000 mg.  Diet: 1. Low sodium Heart Healthy Diet is recommended.  2. It is safe to use a laxative, such as Miralax or Colace, if you have difficulty moving your bowels. You can take Sennakot at bedtime every evening to keep bowel movements regular and to prevent constipation.    Wound Care: 1. Keep clean and dry.  Shower daily.  Reasons to call the Doctor:  Fever - Oral temperature greater than 100.4 degrees Fahrenheit  Foul-smelling vaginal discharge  Difficulty urinating  Nausea and vomiting  Increased pain at the site of the incision that is unrelieved with pain medicine.  Difficulty breathing with or without chest pain  New calf pain especially if only on one side  Sudden,  continuing increased vaginal bleeding with or without clots.   Contacts: For questions or concerns you should contact:  Dr. Everitt Amber at [phone]  Joylene John, NP at [phone]  After Hours: call [phone] and have the GYN Oncologist paged/contacted   Post Anesthesia Home Care Instructions  Activity: Get plenty of rest for the remainder of the day. A responsible individual must stay with you for 24 hours following the procedure.  For the next 24 hours, DO NOT: -Drive a car -Paediatric nurse -Drink alcoholic beverages -Take any medication unless instructed by your physician -Make any legal decisions or sign important papers.  Meals: Start with liquid foods such as gelatin or soup. Progress to regular foods as tolerated. Avoid greasy, spicy, heavy foods. If nausea and/or vomiting occur, drink only clear liquids until the nausea and/or vomiting subsides. Call your physician if vomiting continues.  Special Instructions/Symptoms: Your throat may feel dry or sore from the anesthesia or the breathing tube placed in your throat during surgery. If this causes discomfort, gargle with warm salt water. The discomfort should disappear within 24 hours.  May take next dose of Tylenol after 4:30 PM today, December 1st, 2021.

## 2020-04-01 NOTE — Op Note (Addendum)
OPERATIVE NOTE 04/01/20  Surgeon: Donaciano Eva   Assistants: Joylene John NP (a provider assistant was necessary for tissue manipulation, management of robotic instrumentation, retraction and positioning due to the complexity of the case and hospital policies).   Anesthesia: General endotracheal anesthesia  ASA Class: 3   Pre-operative Diagnosis: endometrial cancer grade 1  Post-operative Diagnosis: same,   Operation: Robotic-assisted laparoscopic total hysterectomy with bilateral salpingoophorectomy, SLN biopsy, left pelvic lymphadenectomy   Surgeon: Donaciano Eva  Assistant Surgeon: Joylene John, NP  Anesthesia: GET  Urine Output: 100cc  Operative Findings:  : 10cm uterus, normal appearing ovaries and tube, normal appearing abdominal cavity, no gross extrauterine disease, unilateral SLN mapping to the right  Estimated Blood Loss:  25cc      Total IV Fluids: 800 ml         Specimens: uterus, cervix, bilateral tubes and ovaries, right external iliac SLN, left pelvic lymph nodes.         Complications:  None; patient tolerated the procedure well.         Disposition: PACU - hemodynamically stable.  Procedure Details  The patient was seen in the Holding Room. The risks, benefits, complications, treatment options, and expected outcomes were discussed with the patient.  The patient concurred with the proposed plan, giving informed consent.  The site of surgery properly noted/marked. The patient was identified as Christina Daniel and the procedure verified as a Robotic-assisted hysterectomy with bilateral salpingo oophorectomy with SLN biopsy. A Time Out was held and the above information confirmed.  After induction of anesthesia, the patient was draped and prepped in the usual sterile manner. Pt was placed in supine position after anesthesia and draped and prepped in the usual sterile manner. The abdominal drape was placed after the CholoraPrep had been allowed to  dry for 3 minutes.  Her arms were tucked to her side with all appropriate precautions.  The shoulders were stabilized with padded shoulder blocks applied to the acromium processes.  The patient was placed in the semi-lithotomy position in [place].  The perineum was prepped with Betadine. The patient was then prepped. Foley catheter was placed.  A sterile speculum was placed in the vagina.  The cervix was grasped with a single-tooth tenaculum. 2mg  total of ICG was injected into the cervical stroma at 2 and 9 o'clock with 1cc injected at a 1cm and 88mm depth (concentration 0.5mg /ml) in all locations. The cervix was dilated with Kennon Rounds dilators.  The ZUMI uterine manipulator with a medium colpotomizer ring was placed without difficulty.  A pneum occluder balloon was placed over the manipulator.  OG tube placement was confirmed and to suction.   Next, a 5 mm skin incision was made 1 cm below the subcostal margin in the midclavicular line.  The 5 mm Optiview port and scope was used for direct entry.  Opening pressure was under 10 mm CO2.  The abdomen was insufflated and the findings were noted as above.   At this point and all points during the procedure, the patient's intra-abdominal pressure did not exceed 15 mmHg. Next, a 10 mm skin incision was made in the umbilicus and a right and left port was placed about 10 cm lateral to the robot port on the right and left side. All ports were placed under direct visualization.  The patient was placed in steep Trendelenburg.  Bowel was folded away into the upper abdomen.  The robot was docked in the normal manner.  The right and left  peritoneum were opened parallel to the IP ligament to open the retroperitoneal spaces bilaterally. The SLN mapping was performed in bilateral pelvic basins. The para rectal and paravesical spaces were opened up entirely with careful dissection below the level of the ureters bilaterally and to the depth of the uterine artery origin in order  to skeletonize the uterine "web" and ensure visualization of all parametrial channels. The para-aortic basins were carefully exposed and evaluated for isolated para-aortic SLN's. Lymphatic channels were identified travelling to the following visualized sentinel lymph node's: right external iliac SLN. These SLN's were separated from their surrounding lymphatic tissue, removed and sent for permanent pathology.  The hysterectomy was started after the round ligament on the right side was incised and the retroperitoneum was entered and the pararectal space was developed.  The ureter was noted to be on the medial leaf of the broad ligament.  The peritoneum above the ureter was incised and stretched and the infundibulopelvic ligament was skeletonized, cauterized and cut.  The posterior peritoneum was taken down to the level of the KOH ring.  The anterior peritoneum was also taken down.  The bladder flap was created to the level of the KOH ring.  The uterine artery on the right side was skeletonized, cauterized and cut in the normal manner.  A similar procedure was performed on the left.  The colpotomy was made and the uterus, cervix, bilateral ovaries and tubes were amputated and delivered through the vagina.  Pedicles were inspected and excellent hemostasis was achieved.    The left pelvic lymph node basin was again inspected for SLN's however none were seen (consistent with unilateral mapping). Therefore a decision was made to proceed with left pelvic lymphadenectomy. The paravesical space was developed with monopolar and sharp dissection. It was held open with tension on the median umbilical ligament with the forth arm. The paravesical space was opened with blunt and sharp dissection to mobilize the ureter off of the medial surface of the internal iliac artery. The medial leaf of the broad ligament containing the ureter was held medially (opening the pararectal space) by the assistant's grasper. The left pelvic  lymphadenectomy was performed by skeletonizing the internal iliac artery at the bifurcation with the external iliac artery. The obturator nerve was identified in the base of lateral paravesical space. The ureter was mobilized medially off of the dissection by developing the pararectal space. The genitofemoral nerve was identified, skeletonized and mobilized laterally off of the external iliac artery. An enbloc resection of lymph nodes was performed within the following boundaries: the mid portion of the common iliac proximally, the circumflex iliac vein distally, the obturator nerve posteriorally, the genitofemoral nerve laterally. The nodal basin (including obturator space) were confirmed to be empty of nodes and hemostatic. The nodes were placed in an endocatch bag and retrieved vaginally.  The colpotomy at the vaginal cuff was closed with Vicryl on a CT1 needle in a running manner.  Irrigation was used and excellent hemostasis was achieved.  At this point in the procedure was completed.  Robotic instruments were removed under direct visulaization.  The robot was undocked. The 10 mm ports were closed with Vicryl on a UR-5 needle and the fascia was closed with 0 Vicryl on a UR-5 needle.  The skin was closed with 4-0 Vicryl in a subcuticular manner.  Dermabond was applied.  Sponge, lap and needle counts correct x 2.  The patient was taken to the recovery room in stable condition.  The vagina was swabbed  with  minimal bleeding noted.   All instrument and needle counts were correct x  3.   The patient was transferred to the recovery room in a stable condition.  Donaciano Eva, MD

## 2020-04-01 NOTE — Anesthesia Procedure Notes (Signed)
Procedure Name: Intubation Date/Time: 04/01/2020 11:18 AM Performed by: Bonney Aid, CRNA Pre-anesthesia Checklist: Patient identified, Emergency Drugs available, Suction available and Patient being monitored Patient Re-evaluated:Patient Re-evaluated prior to induction Oxygen Delivery Method: Circle system utilized Preoxygenation: Pre-oxygenation with 100% oxygen Induction Type: IV induction Ventilation: Mask ventilation without difficulty Laryngoscope Size: Mac and 3 Grade View: Grade I Tube type: Oral Tube size: 7.0 mm Number of attempts: 1 Airway Equipment and Method: Stylet Placement Confirmation: ETT inserted through vocal cords under direct vision,  positive ETCO2 and breath sounds checked- equal and bilateral Secured at: 20 cm Tube secured with: Tape Dental Injury: Teeth and Oropharynx as per pre-operative assessment

## 2020-04-02 ENCOUNTER — Encounter (HOSPITAL_BASED_OUTPATIENT_CLINIC_OR_DEPARTMENT_OTHER): Payer: Self-pay | Admitting: Gynecologic Oncology

## 2020-04-02 ENCOUNTER — Telehealth: Payer: Self-pay

## 2020-04-02 NOTE — Anesthesia Postprocedure Evaluation (Signed)
Anesthesia Post Note  Patient: Christina Daniel  Procedure(s) Performed: XI ROBOTIC ASSISTED TOTAL HYSTERECTOMY WITH BILATERAL SALPINGO OOPHORECTOMY (Bilateral Abdomen) SENTINEL NODE BIOPSY, LEFT PELVIC LYMPHADENECTOMY (N/A Abdomen)     Patient location during evaluation: PACU Anesthesia Type: General Level of consciousness: awake and alert Pain management: pain level controlled Vital Signs Assessment: post-procedure vital signs reviewed and stable Respiratory status: spontaneous breathing, nonlabored ventilation and respiratory function stable Cardiovascular status: blood pressure returned to baseline and stable Postop Assessment: no apparent nausea or vomiting Anesthetic complications: no   No complications documented.  Last Vitals:  Vitals:   04/01/20 1500 04/01/20 1600  BP: 99/68 122/76  Pulse: 65 67  Resp: 12 12  Temp:  (!) 36.4 C  SpO2: 95% 96%    Last Pain:  Vitals:   04/01/20 1600  TempSrc:   PainSc: 0-No pain                 Lidia Collum

## 2020-04-02 NOTE — Telephone Encounter (Signed)
Ms Stoecker states that she is eating,drinking, and urinating well. Not passing gas. No N/V. Told her to increase the senokot-s to 2 tabs bid with 8 oz of water.  If no BM by Saturday am, she can add Miralax 1 capful bid. Pt actually out at grocery store this am. Afebrile. Incisions D&I Pt aware of post op appointments as well as the office number [phone] and after hours number [phone] to call if she has any questions or concerns

## 2020-04-03 ENCOUNTER — Other Ambulatory Visit: Payer: Self-pay

## 2020-04-06 ENCOUNTER — Telehealth: Payer: Self-pay | Admitting: *Deleted

## 2020-04-06 LAB — SURGICAL PATHOLOGY

## 2020-04-06 NOTE — Telephone Encounter (Signed)
Returned the patient's call and explained that it is a phone tomorrow at 3:15 pm

## 2020-04-07 ENCOUNTER — Telehealth: Payer: Self-pay | Admitting: Oncology

## 2020-04-07 ENCOUNTER — Encounter: Payer: Self-pay | Admitting: Gynecologic Oncology

## 2020-04-07 ENCOUNTER — Inpatient Hospital Stay: Payer: Medicare PPO | Attending: Gynecologic Oncology | Admitting: Gynecologic Oncology

## 2020-04-07 DIAGNOSIS — Z7189 Other specified counseling: Secondary | ICD-10-CM

## 2020-04-07 DIAGNOSIS — C541 Malignant neoplasm of endometrium: Secondary | ICD-10-CM

## 2020-04-07 DIAGNOSIS — Z90710 Acquired absence of both cervix and uterus: Secondary | ICD-10-CM

## 2020-04-07 DIAGNOSIS — Z90722 Acquired absence of ovaries, bilateral: Secondary | ICD-10-CM
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
## 2020-04-07 NOTE — Telephone Encounter (Signed)
Called [facility] and scheduled appointment for genetic counseling on 05/05/19 at 1:00.

## 2020-04-08 ENCOUNTER — Telehealth: Payer: Self-pay | Admitting: *Deleted

## 2020-04-08 NOTE — Telephone Encounter (Signed)
Patient called and moved her genetics appt from 1/3 to 1/6

## 2020-04-10 ENCOUNTER — Ambulatory Visit
Admission: RE | Admit: 2020-04-10 | Discharge: 2020-04-10 | Disposition: A | Discharge status: Home / Self Care | Payer: Medicare PPO | Source: Ambulatory Visit | Attending: Adult Health | Admitting: Adult Health

## 2020-04-10 ENCOUNTER — Other Ambulatory Visit: Payer: Self-pay

## 2020-04-10 DIAGNOSIS — Z1231 Encounter for screening mammogram for malignant neoplasm of breast: Secondary | ICD-10-CM

## 2020-04-14 ENCOUNTER — Telehealth: Payer: Self-pay | Admitting: Interventional Cardiology

## 2020-04-14 NOTE — Telephone Encounter (Signed)
    Pt returning call, she said Dr. Jacelyn Grip recommend for her to stop taking aspirin and get an alternative meds for her to avoid heart attack. She said per Dr. Harless Litten it is proven working for men but not for women. She said she will still follow what Dr. Thompson Caul recommendation

## 2020-04-14 NOTE — Telephone Encounter (Signed)
Pt c/o medication issue:  1. Name of Medication: aspirin  2. How are you currently taking this medication (dosage and times per day)? 1 tablet daily  3. Are you having a reaction (difficulty breathing--STAT)? no  4. What is your medication issue? Christina Daniel from Dr. Jodi Mourning office states the patient was asking if she should stop taking aspirin. Please advise.

## 2020-04-14 NOTE — Telephone Encounter (Signed)
Will route to Dr. Tamala Julian to see if pt should stay on ASA 81mg  QD.  I do not see this medication on pt's list currently but I do see it listed in medications that need to be reconciled.  Called pt and left message to call back to clarify that she has been taking this medication.

## 2020-04-15 NOTE — Telephone Encounter (Signed)
Coronary calcium score is greater than 200.  There is nonobstructive coronary artery disease.  Recommend continuing low-dose aspirin 81 mg.  Would be reasonable to decrease dose to 81 mg Monday, Wednesday, and Friday.

## 2020-04-15 NOTE — Telephone Encounter (Signed)
Spoke with pt and made her aware of information from Dr. Tamala Julian.  Pt states she would prefer to stay on ASA daily so she doesn't forget to take it.  Pt appreciative for call.

## 2020-04-17 DIAGNOSIS — Z0000 Encounter for general adult medical examination without abnormal findings: Secondary | ICD-10-CM | POA: Diagnosis not present

## 2020-04-17 DIAGNOSIS — Z1389 Encounter for screening for other disorder: Secondary | ICD-10-CM | POA: Diagnosis not present
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
## 2020-04-30 ENCOUNTER — Inpatient Hospital Stay: Payer: Medicare PPO | Admitting: Gynecologic Oncology

## 2020-04-30 ENCOUNTER — Encounter: Payer: Self-pay | Admitting: Gynecologic Oncology

## 2020-04-30 DIAGNOSIS — Z20822 Contact with and (suspected) exposure to covid-19: Secondary | ICD-10-CM | POA: Diagnosis not present

## 2020-05-02 ENCOUNTER — Encounter: Payer: Self-pay | Admitting: Gynecologic Oncology

## 2020-05-04 ENCOUNTER — Encounter: Payer: Medicare PPO | Admitting: Genetic Counselor

## 2020-05-04 ENCOUNTER — Other Ambulatory Visit: Payer: Medicare PPO

## 2020-05-05 ENCOUNTER — Telehealth: Payer: Self-pay | Admitting: Genetic Counselor

## 2020-05-05 NOTE — Telephone Encounter (Signed)
Christina Daniel called to ask about the purpose of her genetic counseling appointment on Thursday (1/6) since she has had genetic testing in the past. We briefly reviewed the family history and genetic testing from 2014, which was fairly comprehensive. We discussed that there is additional testing that can be done (namely RNA testing) that was not included on her previous test. This additional testing may detect a previously undetected mutation in a very small percentage of cases. Christina Daniel will consider this information and will let us know if she would like to cancel her genetic counseling appointment.

## 2020-05-06 ENCOUNTER — Telehealth: Payer: Self-pay
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
## 2020-05-06 NOTE — Telephone Encounter (Signed)
Christina  Daniel stated that she discussed the genetics appointment as to it's additional benefits from previous testing with Massie Maroon.  She thought it over and wishes to cancel appointment tomorrow with Luanna Cole.  Contacted Irving Burton with information.  She will cancel the appointment.

## 2020-05-07 ENCOUNTER — Telehealth: Payer: Medicare PPO | Admitting: Licensed Clinical Social Worker

## 2020-05-11 ENCOUNTER — Telehealth: Payer: Self-pay | Admitting: *Deleted

## 2020-05-11 DIAGNOSIS — Z6820 Body mass index (BMI) 20.0-20.9, adult: Secondary | ICD-10-CM | POA: Diagnosis not present

## 2020-05-11 DIAGNOSIS — Z01419 Encounter for gynecological examination (general) (routine) without abnormal findings: Secondary | ICD-10-CM | POA: Diagnosis not present

## 2020-05-11 NOTE — Telephone Encounter (Signed)
Patient called and rescheduled her genetics appt

## 2020-05-12 ENCOUNTER — Encounter: Payer: Self-pay | Admitting: Gynecologic Oncology

## 2020-05-13 ENCOUNTER — Encounter: Payer: Self-pay | Admitting: Gynecologic Oncology

## 2020-05-13 ENCOUNTER — Inpatient Hospital Stay: Payer: Medicare PPO | Attending: Gynecologic Oncology | Admitting: Gynecologic Oncology

## 2020-05-13 ENCOUNTER — Other Ambulatory Visit: Payer: Self-pay

## 2020-05-13 VITALS — BP 120/73 | HR 95 | Temp 98.3°F | Resp 18 | Wt 125.8 lb

## 2020-05-13 DIAGNOSIS — Z90710 Acquired absence of both cervix and uterus: Secondary | ICD-10-CM

## 2020-05-13 DIAGNOSIS — C541 Malignant neoplasm of endometrium: Secondary | ICD-10-CM

## 2020-05-13 DIAGNOSIS — Z90722 Acquired absence of ovaries, bilateral: Secondary | ICD-10-CM

## 2020-05-13 DIAGNOSIS — Z7189 Other specified counseling: Secondary | ICD-10-CM

## 2020-05-13 NOTE — Patient Instructions (Signed)
Please avoid intercourse (sex) for another 6 weeks AND until there is no more spotting or bleeding from the vagina.  It is safe to lift any size weight at this time. You can return to normal activities. You can swim and bathe.  Please notify Dr Denman George at phone number [phone] if you notice vaginal bleeding, new pelvic or abdominal pains, bloating, feeling full easy, or a change in bladder or bowel function.   Please contact Dr Serita Grit office (at [phone]) in April, 2022 to request an appointment with her for July, 2022.

## 2020-05-26 ENCOUNTER — Telehealth: Payer: Self-pay | Admitting: *Deleted

## 2020-05-26 NOTE — Telephone Encounter (Signed)
Patient called and cancelled her genetics appts. Patient stated "I'm just not ready and this can wait awhile. I'll call back once the weather is better." Message given to [facility] APP

## 2020-05-27 ENCOUNTER — Inpatient Hospital Stay: Payer: Medicare PPO | Admitting: Genetic Counselor

## 2020-05-27 ENCOUNTER — Inpatient Hospital Stay: Payer: Medicare PPO

## 2020-06-06 ENCOUNTER — Encounter: Payer: Self-pay | Admitting: Gynecologic Oncology

## 2020-06-09 NOTE — Telephone Encounter (Signed)
Spoke with Ms Tourangeau and told her that Dr. Denman George said that no further treatment was needed as her cancer was a Stage IA, confined to the uterus which was removed. There is a very low chance the cancer will recur(less than 5%). She needs to come to visits as scheduled and report any new symptoms.  Pt verbalized understanding.

## 2020-07-15 DIAGNOSIS — M21621 Bunionette of right foot: Secondary | ICD-10-CM | POA: Diagnosis not present

## 2020-07-15 DIAGNOSIS — M21622 Bunionette of left foot: Secondary | ICD-10-CM | POA: Diagnosis not present

## 2020-07-15 DIAGNOSIS — M21611 Bunion of right foot: Secondary | ICD-10-CM | POA: Diagnosis not present

## 2020-07-15 DIAGNOSIS — L603 Nail dystrophy: Secondary | ICD-10-CM | POA: Diagnosis not present

## 2020-07-15 DIAGNOSIS — M21612 Bunion of left foot: Secondary | ICD-10-CM | POA: Diagnosis not present

## 2020-07-22 DIAGNOSIS — L603 Nail dystrophy: Secondary | ICD-10-CM | POA: Diagnosis not present

## 2020-08-05 DIAGNOSIS — M21611 Bunion of right foot: Secondary | ICD-10-CM | POA: Diagnosis not present

## 2020-08-05 DIAGNOSIS — L602 Onychogryphosis: Secondary | ICD-10-CM | POA: Diagnosis not present

## 2020-08-05 DIAGNOSIS — M21621 Bunionette of right foot: Secondary | ICD-10-CM | POA: Diagnosis not present

## 2020-08-05 DIAGNOSIS — M21622 Bunionette of left foot: Secondary | ICD-10-CM | POA: Diagnosis not present

## 2020-10-21 DIAGNOSIS — I712 Thoracic aortic aneurysm, without rupture: Secondary | ICD-10-CM | POA: Diagnosis not present

## 2020-10-21 DIAGNOSIS — I10 Essential (primary) hypertension: Secondary | ICD-10-CM | POA: Diagnosis not present

## 2020-10-21 DIAGNOSIS — Z79899 Other long term (current) drug therapy: Secondary | ICD-10-CM | POA: Diagnosis not present

## 2020-10-21 DIAGNOSIS — E7841 Elevated Lipoprotein(a): Secondary | ICD-10-CM | POA: Diagnosis not present

## 2020-10-21 DIAGNOSIS — R809 Proteinuria, unspecified: Secondary | ICD-10-CM | POA: Diagnosis not present

## 2020-10-21 DIAGNOSIS — M810 Age-related osteoporosis without current pathological fracture: Secondary | ICD-10-CM | POA: Diagnosis not present

## 2020-10-21 DIAGNOSIS — R739 Hyperglycemia, unspecified: Secondary | ICD-10-CM | POA: Diagnosis not present

## 2020-10-29 ENCOUNTER — Telehealth: Payer: Self-pay | Admitting: Oncology

## 2020-10-29 NOTE — Telephone Encounter (Signed)
R/s appts per 6/30 sch msg. Pt aware.
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
## 2020-11-04 ENCOUNTER — Encounter: Payer: Self-pay | Admitting: Interventional Cardiology

## 2020-11-04 ENCOUNTER — Ambulatory Visit: Payer: Medicare PPO | Admitting: Interventional Cardiology

## 2020-11-04 ENCOUNTER — Other Ambulatory Visit: Payer: Self-pay

## 2020-11-04 VITALS — BP 112/86 | HR 74 | Ht 66.0 in | Wt 126.5 lb

## 2020-11-04 DIAGNOSIS — I712 Thoracic aortic aneurysm, without rupture: Secondary | ICD-10-CM

## 2020-11-04 DIAGNOSIS — R9431 Abnormal electrocardiogram [ECG] [EKG]: Secondary | ICD-10-CM

## 2020-11-04 DIAGNOSIS — I10 Essential (primary) hypertension: Secondary | ICD-10-CM

## 2020-11-04 DIAGNOSIS — I2510 Atherosclerotic heart disease of native coronary artery without angina pectoris: Secondary | ICD-10-CM

## 2020-11-04 DIAGNOSIS — Z853 Personal history of malignant neoplasm of breast: Secondary | ICD-10-CM | POA: Diagnosis not present

## 2020-11-04 DIAGNOSIS — I7121 Aneurysm of the ascending aorta, without rupture: Secondary | ICD-10-CM

## 2020-11-04 NOTE — Patient Instructions (Signed)
Medication Instructions:  *If you need a refill on your cardiac medications before your next appointment, please call your pharmacy*  Lab Work: If you have labs (blood work) drawn today and your tests are completely normal, you will receive your results only by: [place] (if you have MyChart) OR A paper copy in the mail If you have any lab test that is abnormal or we need to change your treatment, we will call you to review the results.  Follow-Up: At [facility], you and your health needs are our priority.  As part of our continuing mission to provide you with exceptional heart care, we have created designated Provider Care Teams.  These Care Teams include your primary Cardiologist (physician) and Advanced Practice Providers (APPs -  Physician Assistants and Nurse Practitioners) who all work together to provide you with the care you need, when you need it.  We recommend signing up for the patient portal called "MyChart".  Sign up information is provided on this After Visit Summary.  MyChart is used to connect with patients for Virtual Visits (Telemedicine).  Patients are able to view lab/test results, encounter notes, upcoming appointments, etc.  Non-urgent messages can be sent to your provider as well.   To learn more about what you can do with MyChart, go to [email or web address].    Your next appointment:   1 year(s)  The format for your next appointment:   In Person  Provider:   You may see Sinclair Grooms, MD or one of the following Advanced Practice Providers on your designated Care Team:   Kathyrn Drown, NP

## 2020-11-09 DIAGNOSIS — M810 Age-related osteoporosis without current pathological fracture: Secondary | ICD-10-CM | POA: Diagnosis not present

## 2020-11-09 DIAGNOSIS — R809 Proteinuria, unspecified: Secondary | ICD-10-CM | POA: Diagnosis not present

## 2020-11-09 DIAGNOSIS — I2510 Atherosclerotic heart disease of native coronary artery without angina pectoris: Secondary | ICD-10-CM | POA: Diagnosis not present

## 2020-11-09 DIAGNOSIS — E7841 Elevated Lipoprotein(a): Secondary | ICD-10-CM | POA: Diagnosis not present

## 2020-11-09 DIAGNOSIS — Z79899 Other long term (current) drug therapy: Secondary | ICD-10-CM | POA: Diagnosis not present

## 2020-11-09 DIAGNOSIS — I10 Essential (primary) hypertension: Secondary | ICD-10-CM | POA: Diagnosis not present

## 2020-11-09 DIAGNOSIS — I712 Thoracic aortic aneurysm, without rupture: Secondary | ICD-10-CM | POA: Diagnosis not present

## 2020-11-09 DIAGNOSIS — R739 Hyperglycemia, unspecified: Secondary | ICD-10-CM | POA: Diagnosis not present

## 2020-11-24 ENCOUNTER — Other Ambulatory Visit: Payer: Medicare PPO

## 2020-11-24 ENCOUNTER — Ambulatory Visit: Payer: Medicare PPO | Admitting: Oncology

## 2020-12-25 DIAGNOSIS — U071 COVID-19: Secondary | ICD-10-CM | POA: Diagnosis not present

## 2020-12-25 DIAGNOSIS — Z681 Body mass index (BMI) 19 or less, adult: Secondary | ICD-10-CM | POA: Diagnosis not present

## 2021-01-11 ENCOUNTER — Other Ambulatory Visit: Payer: Self-pay | Admitting: *Deleted

## 2021-01-11 DIAGNOSIS — C50211 Malignant neoplasm of upper-inner quadrant of right female breast: Secondary | ICD-10-CM

## 2021-01-11 DIAGNOSIS — Z170 Estrogen receptor positive status [ER+]: Secondary | ICD-10-CM

## 2021-01-12 ENCOUNTER — Other Ambulatory Visit: Payer: Self-pay

## 2021-01-12 ENCOUNTER — Inpatient Hospital Stay: Payer: Medicare PPO

## 2021-01-12 ENCOUNTER — Inpatient Hospital Stay: Payer: Medicare PPO | Attending: Oncology | Admitting: Oncology

## 2021-01-12 VITALS — BP 119/80 | HR 99 | Temp 98.1°F | Resp 18 | Ht 66.0 in | Wt 125.3 lb

## 2021-01-12 DIAGNOSIS — Z79899 Other long term (current) drug therapy: Secondary | ICD-10-CM | POA: Diagnosis not present

## 2021-01-12 DIAGNOSIS — C541 Malignant neoplasm of endometrium: Secondary | ICD-10-CM

## 2021-01-12 DIAGNOSIS — I10 Essential (primary) hypertension: Secondary | ICD-10-CM | POA: Diagnosis not present

## 2021-01-12 DIAGNOSIS — Z923 Personal history of irradiation: Secondary | ICD-10-CM | POA: Diagnosis not present

## 2021-01-12 DIAGNOSIS — Z90722 Acquired absence of ovaries, bilateral: Secondary | ICD-10-CM | POA: Insufficient documentation

## 2021-01-12 DIAGNOSIS — Z8542 Personal history of malignant neoplasm of other parts of uterus: Secondary | ICD-10-CM | POA: Insufficient documentation

## 2021-01-12 DIAGNOSIS — E785 Hyperlipidemia, unspecified: Secondary | ICD-10-CM | POA: Diagnosis not present

## 2021-01-12 DIAGNOSIS — Z7982 Long term (current) use of aspirin: Secondary | ICD-10-CM | POA: Diagnosis not present

## 2021-01-12 DIAGNOSIS — C50211 Malignant neoplasm of upper-inner quadrant of right female breast: Secondary | ICD-10-CM | POA: Diagnosis not present

## 2021-01-12 DIAGNOSIS — Z79810 Long term (current) use of selective estrogen receptor modulators (SERMs): Secondary | ICD-10-CM | POA: Insufficient documentation

## 2021-01-12 DIAGNOSIS — Z9221 Personal history of antineoplastic chemotherapy: Secondary | ICD-10-CM | POA: Insufficient documentation

## 2021-01-12 DIAGNOSIS — Z90710 Acquired absence of both cervix and uterus: Secondary | ICD-10-CM | POA: Diagnosis not present

## 2021-01-12 DIAGNOSIS — Z170 Estrogen receptor positive status [ER+]: Secondary | ICD-10-CM | POA: Insufficient documentation

## 2021-01-12 DIAGNOSIS — Z9079 Acquired absence of other genital organ(s): Secondary | ICD-10-CM | POA: Diagnosis not present

## 2021-01-12 DIAGNOSIS — C50011 Malignant neoplasm of nipple and areola, right female breast: Secondary | ICD-10-CM | POA: Diagnosis not present

## 2021-01-12 LAB — CBC WITH DIFFERENTIAL (CANCER CENTER ONLY)
Abs Immature Granulocytes: 0.03 10*3/uL (ref 0.00–0.07)
Basophils Absolute: 0 10*3/uL (ref 0.0–0.1)
Basophils Relative: 0 %
Eosinophils Absolute: 0 10*3/uL (ref 0.0–0.5)
Eosinophils Relative: 0 %
HCT: 40.9 % (ref 36.0–46.0)
Hemoglobin: 13.9 g/dL (ref 12.0–15.0)
Immature Granulocytes: 0 %
Lymphocytes Relative: 14 %
Lymphs Abs: 1 10*3/uL (ref 0.7–4.0)
MCH: 31.5 pg (ref 26.0–34.0)
MCHC: 34 g/dL (ref 30.0–36.0)
MCV: 92.7 fL (ref 80.0–100.0)
Monocytes Absolute: 0.5 10*3/uL (ref 0.1–1.0)
Monocytes Relative: 7 %
Neutro Abs: 5.7 10*3/uL (ref 1.7–7.7)
Neutrophils Relative %: 79 %
Platelet Count: 239 10*3/uL (ref 150–400)
RBC: 4.41 MIL/uL (ref 3.87–5.11)
RDW: 12.9 % (ref 11.5–15.5)
WBC Count: 7.3 10*3/uL (ref 4.0–10.5)
nRBC: 0 % (ref 0.0–0.2)

## 2021-01-12 LAB — CMP (CANCER CENTER ONLY)
ALT: 19 U/L (ref 0–44)
AST: 17 U/L (ref 15–41)
Albumin: 4.1 g/dL (ref 3.5–5.0)
Alkaline Phosphatase: 87 U/L (ref 38–126)
Anion gap: 11 (ref 5–15)
BUN: 22 mg/dL (ref 8–23)
CO2: 29 mmol/L (ref 22–32)
Calcium: 9.7 mg/dL (ref 8.9–10.3)
Chloride: 103 mmol/L (ref 98–111)
Creatinine: 0.76 mg/dL (ref 0.44–1.00)
GFR, Estimated: >60 mL/min (ref >60)
Glucose, Bld: 98 mg/dL (ref 70–99)
Potassium: 4.1 mmol/L (ref 3.5–5.1)
Sodium: 143 mmol/L (ref 135–145)
Total Bilirubin: 0.8 mg/dL (ref 0.3–1.2)
Total Protein: 7.5 g/dL (ref 6.5–8.1)
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
## 2021-02-04 ENCOUNTER — Telehealth: Payer: Self-pay | Admitting: *Deleted

## 2021-02-04 NOTE — Telephone Encounter (Signed)
Called and moved the patient's appt time from 11:30 am to 12:30 pm on 10/12
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
## 2021-02-09 NOTE — Assessment & Plan Note (Addendum)
70 yo with a h/o stage IA grade 1 endometrial cancer (MMRI normal/preserved). S/P staging on 04/01/20 Negative symptom review, normal exam.  No evidence of recurrence  >follow-up every 6 months for 5 years in accordance with NCCN guidelines.

## 2021-02-10 ENCOUNTER — Encounter: Payer: Self-pay | Admitting: Obstetrics & Gynecology

## 2021-02-10 ENCOUNTER — Inpatient Hospital Stay: Payer: Medicare PPO | Attending: Oncology | Admitting: Obstetrics & Gynecology

## 2021-02-10 ENCOUNTER — Other Ambulatory Visit: Payer: Self-pay

## 2021-02-10 DIAGNOSIS — Z8542 Personal history of malignant neoplasm of other parts of uterus: Secondary | ICD-10-CM | POA: Insufficient documentation

## 2021-02-10 DIAGNOSIS — Z90722 Acquired absence of ovaries, bilateral: Secondary | ICD-10-CM | POA: Diagnosis not present

## 2021-02-10 DIAGNOSIS — C541 Malignant neoplasm of endometrium: Secondary | ICD-10-CM

## 2021-02-10 DIAGNOSIS — Z90710 Acquired absence of both cervix and uterus: Secondary | ICD-10-CM | POA: Insufficient documentation

## 2021-02-10 NOTE — Patient Instructions (Signed)
Return in 1 yr 

## 2021-02-25 ENCOUNTER — Other Ambulatory Visit: Payer: Self-pay | Admitting: Adult Health

## 2021-02-25 DIAGNOSIS — Z1231 Encounter for screening mammogram for malignant neoplasm of breast: Secondary | ICD-10-CM

## 2021-04-12 ENCOUNTER — Ambulatory Visit
Admission: RE | Admit: 2021-04-12 | Discharge: 2021-04-12 | Disposition: A | Discharge status: Home / Self Care | Payer: Medicare PPO | Source: Ambulatory Visit | Attending: Adult Health | Admitting: Adult Health

## 2021-04-12 ENCOUNTER — Other Ambulatory Visit: Payer: Self-pay

## 2021-04-12 DIAGNOSIS — Z1231 Encounter for screening mammogram for malignant neoplasm of breast: Secondary | ICD-10-CM | POA: Diagnosis not present

## 2021-05-13 DIAGNOSIS — Z779 Other contact with and (suspected) exposures hazardous to health: Secondary | ICD-10-CM | POA: Diagnosis not present

## 2021-05-13 DIAGNOSIS — Z681 Body mass index (BMI) 19 or less, adult: Secondary | ICD-10-CM | POA: Diagnosis not present

## 2021-05-13 DIAGNOSIS — Z124 Encounter for screening for malignant neoplasm of cervix: Secondary | ICD-10-CM | POA: Diagnosis not present

## 2021-05-13 DIAGNOSIS — R87620 Atypical squamous cells of undetermined significance on cytologic smear of vagina (ASC-US): Secondary | ICD-10-CM | POA: Diagnosis not present

## 2021-05-20 DIAGNOSIS — R102 Pelvic and perineal pain: Secondary | ICD-10-CM | POA: Diagnosis not present

## 2021-05-20 DIAGNOSIS — K625 Hemorrhage of anus and rectum: Secondary | ICD-10-CM | POA: Diagnosis not present

## 2021-05-21 DIAGNOSIS — M810 Age-related osteoporosis without current pathological fracture: Secondary | ICD-10-CM | POA: Diagnosis not present

## 2021-05-21 DIAGNOSIS — R739 Hyperglycemia, unspecified: Secondary | ICD-10-CM | POA: Diagnosis not present

## 2021-05-21 DIAGNOSIS — I2510 Atherosclerotic heart disease of native coronary artery without angina pectoris: Secondary | ICD-10-CM | POA: Diagnosis not present

## 2021-05-21 DIAGNOSIS — R809 Proteinuria, unspecified: Secondary | ICD-10-CM | POA: Diagnosis not present

## 2021-05-21 DIAGNOSIS — Z79899 Other long term (current) drug therapy: Secondary | ICD-10-CM | POA: Diagnosis not present

## 2021-05-24 DIAGNOSIS — R739 Hyperglycemia, unspecified: Secondary | ICD-10-CM | POA: Diagnosis not present

## 2021-05-24 DIAGNOSIS — I2510 Atherosclerotic heart disease of native coronary artery without angina pectoris: Secondary | ICD-10-CM | POA: Diagnosis not present

## 2021-05-24 DIAGNOSIS — Z79899 Other long term (current) drug therapy: Secondary | ICD-10-CM | POA: Diagnosis not present

## 2021-05-24 DIAGNOSIS — K5900 Constipation, unspecified: Secondary | ICD-10-CM | POA: Diagnosis not present

## 2021-05-24 DIAGNOSIS — M810 Age-related osteoporosis without current pathological fracture: Secondary | ICD-10-CM | POA: Diagnosis not present

## 2021-05-24 DIAGNOSIS — R809 Proteinuria, unspecified: Secondary | ICD-10-CM | POA: Diagnosis not present

## 2021-05-24 DIAGNOSIS — R1032 Left lower quadrant pain: Secondary | ICD-10-CM | POA: Diagnosis not present

## 2021-05-24 DIAGNOSIS — K5792 Diverticulitis of intestine, part unspecified, without perforation or abscess without bleeding: Secondary | ICD-10-CM | POA: Diagnosis not present

## 2021-05-24 DIAGNOSIS — K625 Hemorrhage of anus and rectum: Secondary | ICD-10-CM | POA: Diagnosis not present

## 2021-05-31 DIAGNOSIS — D1801 Hemangioma of skin and subcutaneous tissue: Secondary | ICD-10-CM | POA: Diagnosis not present

## 2021-05-31 DIAGNOSIS — L821 Other seborrheic keratosis: Secondary | ICD-10-CM | POA: Diagnosis not present

## 2021-05-31 DIAGNOSIS — L720 Epidermal cyst: Secondary | ICD-10-CM | POA: Diagnosis not present

## 2021-05-31 DIAGNOSIS — L814 Other melanin hyperpigmentation: Secondary | ICD-10-CM | POA: Diagnosis not present

## 2021-05-31 DIAGNOSIS — D229 Melanocytic nevi, unspecified: Secondary | ICD-10-CM | POA: Diagnosis not present

## 2021-05-31 DIAGNOSIS — L578 Other skin changes due to chronic exposure to nonionizing radiation: Secondary | ICD-10-CM | POA: Diagnosis not present

## 2021-07-12 DIAGNOSIS — I10 Essential (primary) hypertension: Secondary | ICD-10-CM | POA: Diagnosis not present

## 2021-07-12 DIAGNOSIS — N958 Other specified menopausal and perimenopausal disorders: Secondary | ICD-10-CM | POA: Diagnosis not present

## 2021-07-12 DIAGNOSIS — Z8262 Family history of osteoporosis: Secondary | ICD-10-CM | POA: Diagnosis not present

## 2021-07-12 DIAGNOSIS — M816 Localized osteoporosis [Lequesne]: Secondary | ICD-10-CM | POA: Diagnosis not present

## 2021-07-27 DIAGNOSIS — K625 Hemorrhage of anus and rectum: Secondary | ICD-10-CM | POA: Diagnosis not present

## 2021-07-27 DIAGNOSIS — K648 Other hemorrhoids: Secondary | ICD-10-CM | POA: Diagnosis not present

## 2021-07-27 DIAGNOSIS — K5730 Diverticulosis of large intestine without perforation or abscess without bleeding: Secondary | ICD-10-CM | POA: Diagnosis not present

## 2021-08-11 DIAGNOSIS — H9201 Otalgia, right ear: Secondary | ICD-10-CM | POA: Diagnosis not present

## 2021-08-11 DIAGNOSIS — R519 Headache, unspecified: Secondary | ICD-10-CM | POA: Diagnosis not present

## 2021-09-16 ENCOUNTER — Telehealth: Payer: Self-pay | Admitting: Hematology and Oncology

## 2021-09-16 NOTE — Telephone Encounter (Signed)
Rescheduled appointment per provider PAL. Left message. 

## 2021-09-20 DIAGNOSIS — L728 Other follicular cysts of the skin and subcutaneous tissue: Secondary | ICD-10-CM | POA: Diagnosis not present

## 2021-09-20 DIAGNOSIS — L720 Epidermal cyst: Secondary | ICD-10-CM | POA: Diagnosis not present

## 2021-10-12 ENCOUNTER — Ambulatory Visit: Payer: Medicare PPO | Admitting: Hematology and Oncology

## 2021-10-12 ENCOUNTER — Other Ambulatory Visit: Payer: Medicare PPO

## 2021-11-08 ENCOUNTER — Ambulatory Visit: Payer: Medicare PPO | Admitting: Hematology and Oncology

## 2021-11-08 ENCOUNTER — Other Ambulatory Visit: Payer: Medicare PPO

## 2021-11-12 ENCOUNTER — Ambulatory Visit: Payer: Medicare PPO | Admitting: Hematology and Oncology

## 2021-11-12 ENCOUNTER — Other Ambulatory Visit: Payer: Medicare PPO

## 2021-11-26 NOTE — Progress Notes (Signed)
Patient Care Team: [name], MD (Inactive) as PCP - General (Family Medicine) Belva Crome, MD as PCP - Cardiology (Cardiology) Kathyrn Lass, MD as Consulting Physician (Family Medicine) Magrinat, Virgie Dad, MD (Inactive) as Consulting Physician (Oncology) Arvella Nigh, MD as Consulting Physician (Obstetrics and Gynecology) Belva Crome, MD as Consulting Physician (Cardiology)  DIAGNOSIS:  Encounter Diagnosis  Name Primary?   Malignant neoplasm of upper-inner quadrant of right breast in female, estrogen receptor positive ([place])     SUMMARY OF ONCOLOGIC HISTORY: Oncology History  Endometrial cancer ([place])  04/01/2000 Surgery   Robotic assisted total hysterectomy, BSO, SLN biopsy (right) and left pelvic lymphadenectomy for unilateral mapping Intraoperative findings were significant for a 10cm normal appearing uterus, normal tubes and ovaries, no gross extrauterine disease, unilateral mapping to the right. Surgery was uncomplicated.    03/20/2020 Initial Diagnosis   Endometrial cancer ([place]) EMB: FIGO Gr 1 endometrioid EC   04/01/2020 Pathologic Stage   FIGO grade 1, stage IA endometrioid endometrial adenocarcinoma. There was 1 of 24m myometrial invasion (inner half) with no LVSI. There was negative nodes, cervix and adnexa. MMR normal/preserved.     CHIEF COMPLIANT: Hx Right Breast Cancer (s/p mastectomy) surveillance establish oncology care with Dr. GLindi Adie   INTERVAL HISTORY: Christina WILDESis a 71y.o with the above mentioned. She presents to the clinic today for a follow-up to establish oncology care with Dr. GLindi Adie She states that she is doing fine. Denies any side effects or symptoms. Every once in a while she feels a little sharp pain in breast. She also had a sharp discomfort in the posterior chest wall area intermittently.   ALLERGIES:  is allergic to ezetimibe-simvastatin, naprosyn [naproxen], niacin, sulfa antibiotics, and tramadol.  MEDICATIONS:  Current  Outpatient Medications  Medication Sig Dispense Refill   cholecalciferol (VITAMIN D3) 25 MCG (1000 UNIT) tablet Take 1 tablet (1,000 Units total) by mouth daily.     amLODipine (NORVASC) 2.5 MG tablet Take 2.5 mg by mouth daily.     aspirin 81 MG EC tablet Take 81 mg by mouth daily.     Calcium Carbonate-Vitamin D (CALCIUM-VITAMIN D3 PO) Take 600 mg by mouth 2 (two) times daily.     hydrochlorothiazide (HYDRODIURIL) 12.5 MG tablet Take 1 tablet by mouth daily.     metoprolol succinate (TOPROL-XL) 25 MG 24 hr tablet TAKE ONE TABLET BY MOUTH ONCE DAILY 90 tablet 0   Multiple Vitamins-Minerals (CENTRUM SILVER PO) Take 1 tablet by mouth daily.     Multiple Vitamins-Minerals (LUTEIN-ZEAXANTHIN PO) Take 1 capsule by mouth daily.     omega-3 acid ethyl esters (LOVAZA) 1 G capsule Take 1 g by mouth 3 (three) times daily.     simvastatin (ZOCOR) 20 MG tablet Take 20 mg by mouth at bedtime.     No current facility-administered medications for this visit.    PHYSICAL EXAMINATION: ECOG PERFORMANCE STATUS: 1 - Symptomatic but completely ambulatory  Vitals:   12/03/21 1114  BP: 127/83  Pulse: 61  Resp: 18  Temp: (!) 97.4 F (36.3 C)  SpO2: 100%   Filed Weights   12/03/21 1114  Weight: 126 lb 4.8 oz (57.3 kg)    BREAST: No palpable masses or nodules in either right or left breasts. No palpable axillary supraclavicular or infraclavicular adenopathy no breast tenderness or nipple discharge. (exam performed in the presence of a chaperone)  LABORATORY DATA:  I have reviewed the data as listed    Latest Ref Rng &  Units 01/12/2021    2:10 PM 03/24/2020    8:52 AM 11/25/2019    8:36 AM  CMP  Glucose 70 - 99 mg/dL 98  102  102   BUN 8 - 23 mg/dL '22  18  20   ' Creatinine 0.44 - 1.00 mg/dL 0.76  0.63  0.70   Sodium 135 - 145 mmol/L 143  139  145   Potassium 3.5 - 5.1 mmol/L 4.1  4.7  4.7   Chloride 98 - 111 mmol/L 103  104  110   CO2 22 - 32 mmol/L '29  27  25   ' Calcium 8.9 - 10.3 mg/dL 9.7   10.0  9.4   Total Protein 6.5 - 8.1 g/dL 7.5   7.1   Total Bilirubin 0.3 - 1.2 mg/dL 0.8   0.6   Alkaline Phos 38 - 126 U/L 87   57   AST 15 - 41 U/L 17   23   ALT 0 - 44 U/L 19   24     Lab Results  Component Value Date   WBC 4.8 12/03/2021   HGB 14.3 12/03/2021   HCT 41.1 12/03/2021   MCV 92.4 12/03/2021   PLT 237 12/03/2021   NEUTROABS 3.1 12/03/2021    ASSESSMENT & PLAN:  Malignant neoplasm of upper-inner quadrant of right breast in female, estrogen receptor positive ([place]) 1993: DCIS right breast UOQ 02/22/2012: Right mastectomy with reconstruction with latissimus muscle T1CN1 stage IIa grade 2 IDC ER/PR positive HER2 positive ratio 3.2 through 04/2012- 04/2013: Postmastectomy radiation 04/11/2012: Abraxane [name] carboplatin followed by [name] maintenance 08/2012-01/2020: Adjuvant tamoxifen x7 years 04/01/20: Hysterectomy with bilateral salpingo-oophorectomy for endometrioid carcinoma T1 a N0 grade 1 with normal mismatch repair proteins  Breast cancer surveillance: 1.  Breast examination: Benign 2. mammogram left breast 04/02/2021: Benign breast density category B  Return to clinic in 1 year for follow-up    No orders of the defined types were placed in this encounter.  The patient has a good understanding of the overall plan. she agrees with it. she will call with any problems that may develop before the next visit here. Total time spent: 30 mins including face to face time and time spent for planning, charting and co-ordination of care   Harriette Ohara, MD 12/03/21    I Gardiner Coins am scribing for Dr. Lindi Adie  I have reviewed the above documentation for accuracy and completeness, and I agree with the above.

## 2021-12-01 ENCOUNTER — Other Ambulatory Visit: Payer: Self-pay | Admitting: *Deleted

## 2021-12-01 DIAGNOSIS — C50211 Malignant neoplasm of upper-inner quadrant of right female breast: Secondary | ICD-10-CM

## 2021-12-03 ENCOUNTER — Inpatient Hospital Stay: Payer: Medicare PPO | Admitting: Hematology and Oncology

## 2021-12-03 ENCOUNTER — Other Ambulatory Visit: Payer: Self-pay

## 2021-12-03 ENCOUNTER — Inpatient Hospital Stay: Payer: Medicare PPO | Attending: Hematology and Oncology

## 2021-12-03 DIAGNOSIS — Z170 Estrogen receptor positive status [ER+]: Secondary | ICD-10-CM | POA: Diagnosis not present

## 2021-12-03 DIAGNOSIS — C50211 Malignant neoplasm of upper-inner quadrant of right female breast: Secondary | ICD-10-CM | POA: Insufficient documentation

## 2021-12-03 DIAGNOSIS — Z79899 Other long term (current) drug therapy: Secondary | ICD-10-CM | POA: Diagnosis not present

## 2021-12-03 DIAGNOSIS — Z9011 Acquired absence of right breast and nipple: Secondary | ICD-10-CM | POA: Diagnosis not present

## 2021-12-03 DIAGNOSIS — Z90722 Acquired absence of ovaries, bilateral: Secondary | ICD-10-CM | POA: Insufficient documentation

## 2021-12-03 DIAGNOSIS — Z90710 Acquired absence of both cervix and uterus: Secondary | ICD-10-CM | POA: Diagnosis not present

## 2021-12-03 DIAGNOSIS — Z7982 Long term (current) use of aspirin: Secondary | ICD-10-CM | POA: Diagnosis not present

## 2021-12-03 LAB — CBC WITH DIFFERENTIAL (CANCER CENTER ONLY)
Abs Immature Granulocytes: 0.01 10*3/uL (ref 0.00–0.07)
Basophils Absolute: 0 10*3/uL (ref 0.0–0.1)
Basophils Relative: 0 %
Eosinophils Absolute: 0 10*3/uL (ref 0.0–0.5)
Eosinophils Relative: 1 %
HCT: 41.1 % (ref 36.0–46.0)
Hemoglobin: 14.3 g/dL (ref 12.0–15.0)
Immature Granulocytes: 0 %
Lymphocytes Relative: 27 %
Lymphs Abs: 1.3 10*3/uL (ref 0.7–4.0)
MCH: 32.1 pg (ref 26.0–34.0)
MCHC: 34.8 g/dL (ref 30.0–36.0)
MCV: 92.4 fL (ref 80.0–100.0)
Monocytes Absolute: 0.3 10*3/uL (ref 0.1–1.0)
Monocytes Relative: 7 %
Neutro Abs: 3.1 10*3/uL (ref 1.7–7.7)
Neutrophils Relative %: 65 %
Platelet Count: 237 10*3/uL (ref 150–400)
RBC: 4.45 MIL/uL (ref 3.87–5.11)
RDW: 12.9 % (ref 11.5–15.5)
WBC Count: 4.8 10*3/uL (ref 4.0–10.5)
nRBC: 0 % (ref 0.0–0.2)

## 2021-12-03 LAB — CMP (CANCER CENTER ONLY)
ALT: 18 U/L (ref 0–44)
AST: 19 U/L (ref 15–41)
Albumin: 4.5 g/dL (ref 3.5–5.0)
Alkaline Phosphatase: 66 U/L (ref 38–126)
Anion gap: 5 (ref 5–15)
BUN: 21 mg/dL (ref 8–23)
CO2: 32 mmol/L (ref 22–32)
Calcium: 9.7 mg/dL (ref 8.9–10.3)
Chloride: 103 mmol/L (ref 98–111)
Creatinine: 0.65 mg/dL (ref 0.44–1.00)
GFR, Estimated: >60 mL/min (ref >60)
Glucose, Bld: 97 mg/dL (ref 70–99)
Potassium: 3.8 mmol/L (ref 3.5–5.1)
Sodium: 140 mmol/L (ref 135–145)
Total Bilirubin: 1.3 mg/dL — ABNORMAL HIGH (ref 0.3–1.2)
Total Protein: 7.5 g/dL (ref 6.5–8.1)

## 2021-12-03 MED ORDER — VITAMIN D 25 MCG (1000 UNIT) PO TABS: 1000.0000 [IU] | ORAL_TABLET | Freq: Every day | ORAL | Status: AC

## 2021-12-03 NOTE — Assessment & Plan Note (Signed)
1993: DCIS right breast UOQ 02/22/2012: Right mastectomy with reconstruction with latissimus muscle T1CN1 stage IIa grade 2 IDC ER/PR positive HER2 positive ratio 3.2 through 04/2012- 04/2013: Postmastectomy radiation 04/11/2012: Abraxane [name] carboplatin followed by [name] maintenance 08/2012-01/2020: Adjuvant tamoxifen x7 years 04/01/20: Hysterectomy with bilateral salpingo-oophorectomy for endometrioid carcinoma T1 a N0 grade 1 with normal mismatch repair proteins  Breast cancer surveillance: 1.  Breast examination: Benign 2. mammogram left breast 04/02/2021: Benign breast density category B  Return to clinic in 1 year for follow-up with long-term survivorship

## 2021-12-08 ENCOUNTER — Ambulatory Visit: Payer: Medicare PPO | Admitting: Interventional Cardiology
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
## 2021-12-30 ENCOUNTER — Ambulatory Visit: Payer: Medicare PPO | Attending: Interventional Cardiology | Admitting: Interventional Cardiology

## 2021-12-30 ENCOUNTER — Encounter: Payer: Self-pay | Admitting: Interventional Cardiology

## 2021-12-30 VITALS — BP 126/88 | HR 75 | Ht 66.75 in | Wt 125.0 lb

## 2021-12-30 DIAGNOSIS — I10 Essential (primary) hypertension: Secondary | ICD-10-CM | POA: Diagnosis not present

## 2021-12-30 DIAGNOSIS — R9431 Abnormal electrocardiogram [ECG] [EKG]: Secondary | ICD-10-CM

## 2021-12-30 DIAGNOSIS — I2510 Atherosclerotic heart disease of native coronary artery without angina pectoris: Secondary | ICD-10-CM | POA: Diagnosis not present

## 2021-12-30 DIAGNOSIS — I7121 Aneurysm of the ascending aorta, without rupture: Secondary | ICD-10-CM | POA: Diagnosis not present

## 2021-12-30 DIAGNOSIS — I422 Other hypertrophic cardiomyopathy: Secondary | ICD-10-CM

## 2021-12-30 LAB — HEMOGLOBIN AND HEMATOCRIT, BLOOD
Hematocrit: 42.1 % (ref 34.0–46.6)
Hemoglobin: 14.3 g/dL (ref 11.1–15.9)

## 2021-12-30 NOTE — Patient Instructions (Addendum)
Medication Instructions:  Your physician recommends that you continue on your current medications as directed. Please refer to the Current Medication list given to you today.  *If you need a refill on your cardiac medications before your next appointment, please call your pharmacy*   Lab Work: TODAY:  HGB & HEMOTACRIT  If you have labs (blood work) drawn today and your tests are completely normal, you will receive your results only by: [place] (if you have MyChart) OR A paper copy in the mail If you have any lab test that is abnormal or we need to change your treatment, we will call you to review the results.   Testing/Procedures: Your physician has requested that you have a cardiac MRI. Cardiac MRI uses a computer to create images of your heart as its beating, producing both still and moving pictures of your heart and major blood vessels. For further information please visit [email or web address]     Follow-Up: At [facility], you and your health needs are our priority.  As part of our continuing mission to provide you with exceptional heart care, we have created designated Provider Care Teams.  These Care Teams include your primary Cardiologist (physician) and Advanced Practice Providers (APPs -  Physician Assistants and Nurse Practitioners) who all work together to provide you with the care you need, when you need it.  We recommend signing up for the patient portal called "MyChart".  Sign up information is provided on this After Visit Summary.  MyChart is used to connect with patients for Virtual Visits (Telemedicine).  Patients are able to view lab/test results, encounter notes, upcoming appointments, etc.  Non-urgent messages can be sent to your provider as well.   To learn more about what you can do with MyChart, go to [email or web address].    Your next appointment:   12 month(s)  The format for your next appointment:   In Person  Provider:   Dr.  Gasper Sells (transitioning from Dr. Tamala Julian)     Other Instructions   Important Information About Sugar

## 2022-01-01 ENCOUNTER — Encounter: Payer: Self-pay | Admitting: Interventional Cardiology

## 2022-01-10 DIAGNOSIS — Z7982 Long term (current) use of aspirin: Secondary | ICD-10-CM | POA: Diagnosis not present

## 2022-01-10 DIAGNOSIS — Z8249 Family history of ischemic heart disease and other diseases of the circulatory system: Secondary | ICD-10-CM | POA: Diagnosis not present

## 2022-01-10 DIAGNOSIS — I10 Essential (primary) hypertension: Secondary | ICD-10-CM | POA: Diagnosis not present

## 2022-01-10 DIAGNOSIS — Z882 Allergy status to sulfonamides status: Secondary | ICD-10-CM | POA: Diagnosis not present

## 2022-01-10 DIAGNOSIS — Z8542 Personal history of malignant neoplasm of other parts of uterus: Secondary | ICD-10-CM | POA: Diagnosis not present

## 2022-01-10 DIAGNOSIS — Z809 Family history of malignant neoplasm, unspecified: Secondary | ICD-10-CM | POA: Diagnosis not present

## 2022-01-10 DIAGNOSIS — Z833 Family history of diabetes mellitus: Secondary | ICD-10-CM | POA: Diagnosis not present

## 2022-01-10 DIAGNOSIS — E785 Hyperlipidemia, unspecified: Secondary | ICD-10-CM | POA: Diagnosis not present

## 2022-01-13 ENCOUNTER — Encounter: Payer: Self-pay | Admitting: Interventional Cardiology

## 2022-01-18 DIAGNOSIS — B078 Other viral warts: Secondary | ICD-10-CM | POA: Diagnosis not present

## 2022-01-19 DIAGNOSIS — F4320 Adjustment disorder, unspecified: Secondary | ICD-10-CM | POA: Diagnosis not present

## 2022-01-25 DIAGNOSIS — F4320 Adjustment disorder, unspecified: Secondary | ICD-10-CM | POA: Diagnosis not present

## 2022-02-16 ENCOUNTER — Telehealth: Payer: Self-pay | Admitting: *Deleted

## 2022-02-16 NOTE — Telephone Encounter (Signed)
Called and left the patient a message to call the office back regarding her appt with Dr Delsa Sale on 11/8. Appt needs to be moved to either later in the day or to 10/25.

## 2022-02-17 DIAGNOSIS — I10 Essential (primary) hypertension: Secondary | ICD-10-CM | POA: Diagnosis not present

## 2022-02-17 DIAGNOSIS — I2510 Atherosclerotic heart disease of native coronary artery without angina pectoris: Secondary | ICD-10-CM | POA: Diagnosis not present

## 2022-02-17 DIAGNOSIS — M810 Age-related osteoporosis without current pathological fracture: Secondary | ICD-10-CM | POA: Diagnosis not present

## 2022-02-17 DIAGNOSIS — Z681 Body mass index (BMI) 19 or less, adult: Secondary | ICD-10-CM | POA: Diagnosis not present

## 2022-02-17 DIAGNOSIS — Z79899 Other long term (current) drug therapy: Secondary | ICD-10-CM | POA: Diagnosis not present

## 2022-02-17 NOTE — Telephone Encounter (Signed)
Called and left message for patient to call back to have appointment rescheduled from 11/8 to October 25 or move to later in the day on 11/8.Christina Daniel

## 2022-02-21 ENCOUNTER — Encounter: Payer: Self-pay | Admitting: Obstetrics & Gynecology

## 2022-02-21 NOTE — Telephone Encounter (Signed)
Pt returned call from [place]. She has moved her appointment to 10/25 @ 3:15.

## 2022-02-23 ENCOUNTER — Encounter: Payer: Self-pay | Admitting: Obstetrics & Gynecology

## 2022-02-23 ENCOUNTER — Other Ambulatory Visit: Payer: Self-pay

## 2022-02-23 ENCOUNTER — Inpatient Hospital Stay: Payer: Medicare PPO | Attending: Hematology and Oncology | Admitting: Obstetrics & Gynecology

## 2022-02-23 VITALS — BP 120/80 | HR 80 | Temp 98.5°F | Resp 16 | Ht 66.0 in | Wt 122.4 lb

## 2022-02-23 DIAGNOSIS — Z90710 Acquired absence of both cervix and uterus: Secondary | ICD-10-CM | POA: Diagnosis not present

## 2022-02-23 DIAGNOSIS — C541 Malignant neoplasm of endometrium: Secondary | ICD-10-CM

## 2022-02-23 DIAGNOSIS — Z90722 Acquired absence of ovaries, bilateral: Secondary | ICD-10-CM | POA: Diagnosis not present

## 2022-02-23 DIAGNOSIS — Z8542 Personal history of malignant neoplasm of other parts of uterus: Secondary | ICD-10-CM | POA: Diagnosis not present

## 2022-02-23 NOTE — Assessment & Plan Note (Signed)
71 year old with a history of stage Ia grade 1 endometrial cancer (MMRI normal/preserved).  Status post staging inDecember 2021 Negative symptom review, normal exam.  No evidence of recurrence  > Continue follow-up every 6 months for 5 years in accordance with NCCN guidelines
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
## 2022-02-23 NOTE — Patient Instructions (Signed)
Return in 1 year ?

## 2022-02-28 ENCOUNTER — Other Ambulatory Visit: Payer: Self-pay | Admitting: Adult Health

## 2022-02-28 DIAGNOSIS — Z1231 Encounter for screening mammogram for malignant neoplasm of breast: Secondary | ICD-10-CM

## 2022-03-09 ENCOUNTER — Inpatient Hospital Stay: Payer: Medicare PPO | Admitting: Obstetrics & Gynecology

## 2022-03-22 ENCOUNTER — Telehealth (HOSPITAL_COMMUNITY): Payer: Self-pay | Admitting: Emergency Medicine

## 2022-03-22 NOTE — Telephone Encounter (Signed)
Attempted to call patient regarding upcoming cardiac MR appointment. Left message on voicemail with name and callback number Aman Batley RN Navigator Cardiac Imaging [facility] [phone] Office [phone] Cell  

## 2022-03-23 ENCOUNTER — Ambulatory Visit (HOSPITAL_COMMUNITY)
Admission: RE | Admit: 2022-03-23 | Discharge: 2022-03-23 | Disposition: A | Discharge status: Home / Self Care | Payer: Medicare PPO | Source: Ambulatory Visit | Attending: Interventional Cardiology | Admitting: Interventional Cardiology

## 2022-03-23 ENCOUNTER — Other Ambulatory Visit: Payer: Self-pay | Admitting: Interventional Cardiology

## 2022-03-23 DIAGNOSIS — R9431 Abnormal electrocardiogram [ECG] [EKG]: Secondary | ICD-10-CM

## 2022-03-23 DIAGNOSIS — I7121 Aneurysm of the ascending aorta, without rupture: Secondary | ICD-10-CM | POA: Insufficient documentation

## 2022-03-23 DIAGNOSIS — I422 Other hypertrophic cardiomyopathy: Secondary | ICD-10-CM | POA: Diagnosis not present

## 2022-03-23 DIAGNOSIS — I2510 Atherosclerotic heart disease of native coronary artery without angina pectoris: Secondary | ICD-10-CM

## 2022-03-23 DIAGNOSIS — I10 Essential (primary) hypertension: Secondary | ICD-10-CM | POA: Diagnosis not present

## 2022-03-23 MED ORDER — GADOBUTROL 1 MMOL/ML IV SOLN
10.0000 mL | Freq: Once | INTRAVENOUS | Status: AC | PRN
  Administered 2022-03-23: 10 mL via INTRAVENOUS

## 2022-03-25 ENCOUNTER — Encounter: Payer: Self-pay | Admitting: Interventional Cardiology

## 2022-03-25 DIAGNOSIS — I422 Other hypertrophic cardiomyopathy: Secondary | ICD-10-CM | POA: Insufficient documentation

## 2022-03-30 ENCOUNTER — Telehealth: Payer: Self-pay | Admitting: Interventional Cardiology

## 2022-03-30 NOTE — Telephone Encounter (Signed)
Spoke with patient and discussed results of cardiac MRI.  Per Dr. Tamala Julian: Let the patient know that the patient and her primary care physician know that she has an abnormal EKG that led to a cardiac MRI which demonstrated apical hypertrophic cardiomyopathy without high risk features. Any children should have 2D Doppler echocardiogram performed and an EKG to look for any evidence of hypertrophic cardiomyopathy. No change in medication. Continue circumflex current therapy.   Patient verbalized understanding. She referred to a MyChart message she sent previously (on 01/01/2022) and would like for Dr. Tamala Julian to revisit that since the MRI did confirm she has hypertrophy. She states she had seen a YouTube video from Dr. Lyndel Safe regarding medications that could reverse this condition.  Will forward to Dr. Tamala Julian to review and advise.

## 2022-03-30 NOTE — Telephone Encounter (Signed)
-----   Message from Belva Crome, MD sent at 03/25/2022  1:23 PM EST ----- Let the patient know that the patient and her primary care physician know that she has an abnormal EKG that led to a cardiac MRI which demonstrated apical hypertrophic cardiomyopathy without high risk features.  Any children should have 2D Doppler echocardiogram performed and an EKG to look for any evidence of hypertrophic cardiomyopathy.  No change in medication.  Continue circumflex current therapy. A copy will be sent to Stacie Glaze, DO

## 2022-04-02 NOTE — Telephone Encounter (Signed)
She should be set to see Dr, Gwendolyn Fill for long term f/u. Let her know he is our [place] expert, is young, and should be her Cardiologist going forward.

## 2022-04-04 NOTE — Telephone Encounter (Signed)
Sent patient a message via MyChart with Dr. Thompson Caul response.

## 2022-04-21 ENCOUNTER — Ambulatory Visit
Admission: RE | Admit: 2022-04-21 | Discharge: 2022-04-21 | Disposition: A | Discharge status: Home / Self Care | Payer: Medicare PPO | Source: Ambulatory Visit | Attending: Adult Health | Admitting: Adult Health

## 2022-04-21 DIAGNOSIS — Z1231 Encounter for screening mammogram for malignant neoplasm of breast: Secondary | ICD-10-CM

## 2022-06-09 DIAGNOSIS — U071 COVID-19: Secondary | ICD-10-CM | POA: Diagnosis not present

## 2022-06-23 IMAGING — MG DIGITAL SCREENING UNILAT LEFT IMPLANT  W/ TOMO W/ CAD
6 series · 6 of 14 positions shown · non-contrast
Comparison: Previous exam(s).

CLINICAL DATA: Screening.

EXAM:
DIGITAL SCREENING UNILATERAL LEFT MAMMOGRAM WITH IMPLANTS, CAD AND
TOMOSYNTHESIS
TECHNIQUE: Left screening digital craniocaudal and mediolateral oblique
mammograms were obtained. Left screening digital breast
tomosynthesis was performed. The images were evaluated with
computer-aided detection. Standard and/or implant displaced views
were performed.

[L CC]
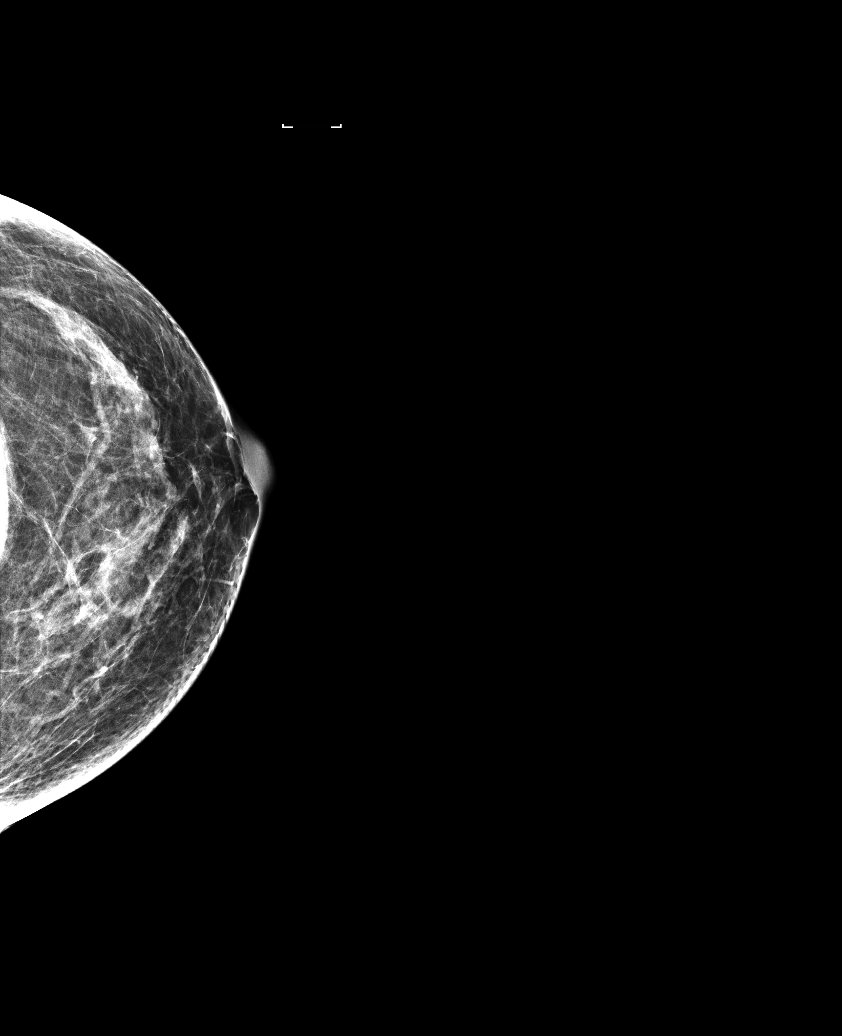

[L MLO]
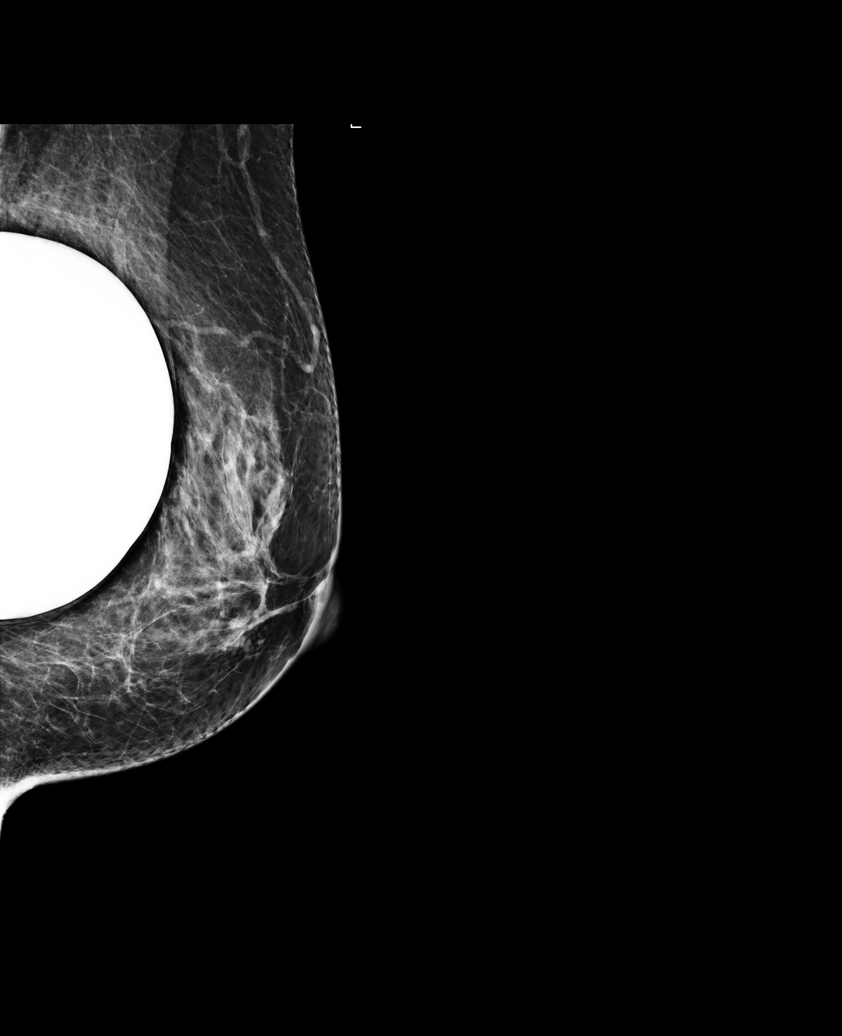

[L CC synth-2D]
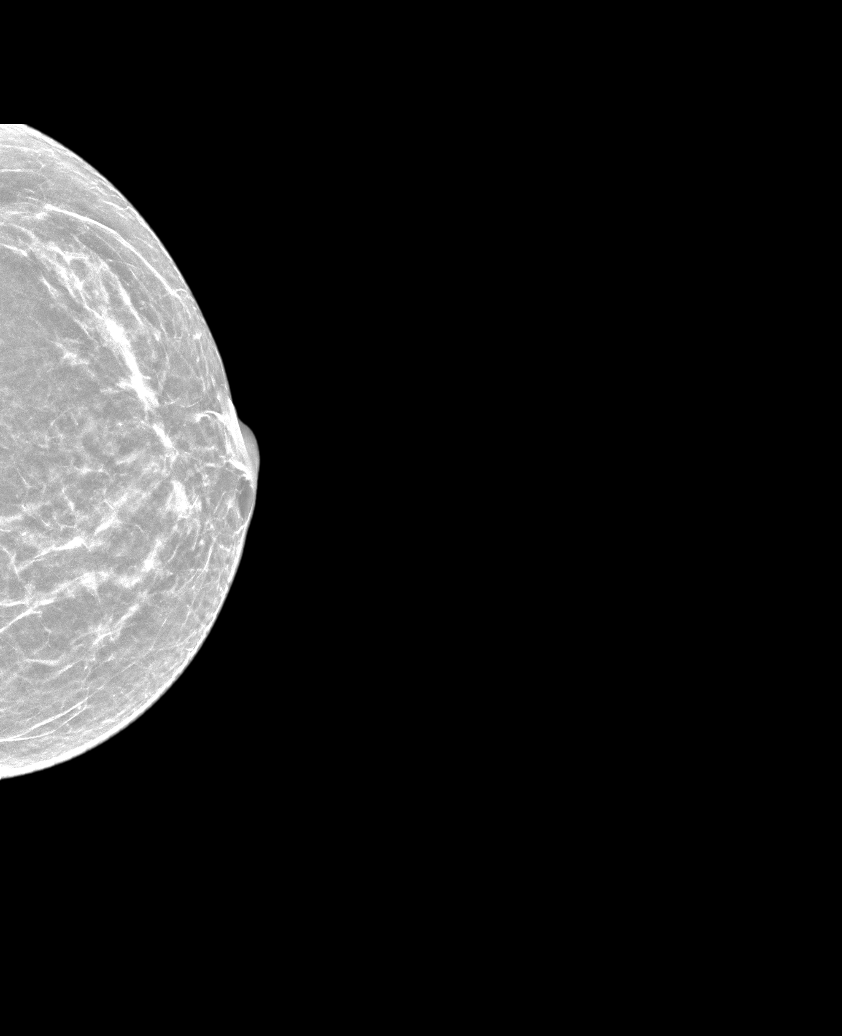

[L MLO synth-2D]
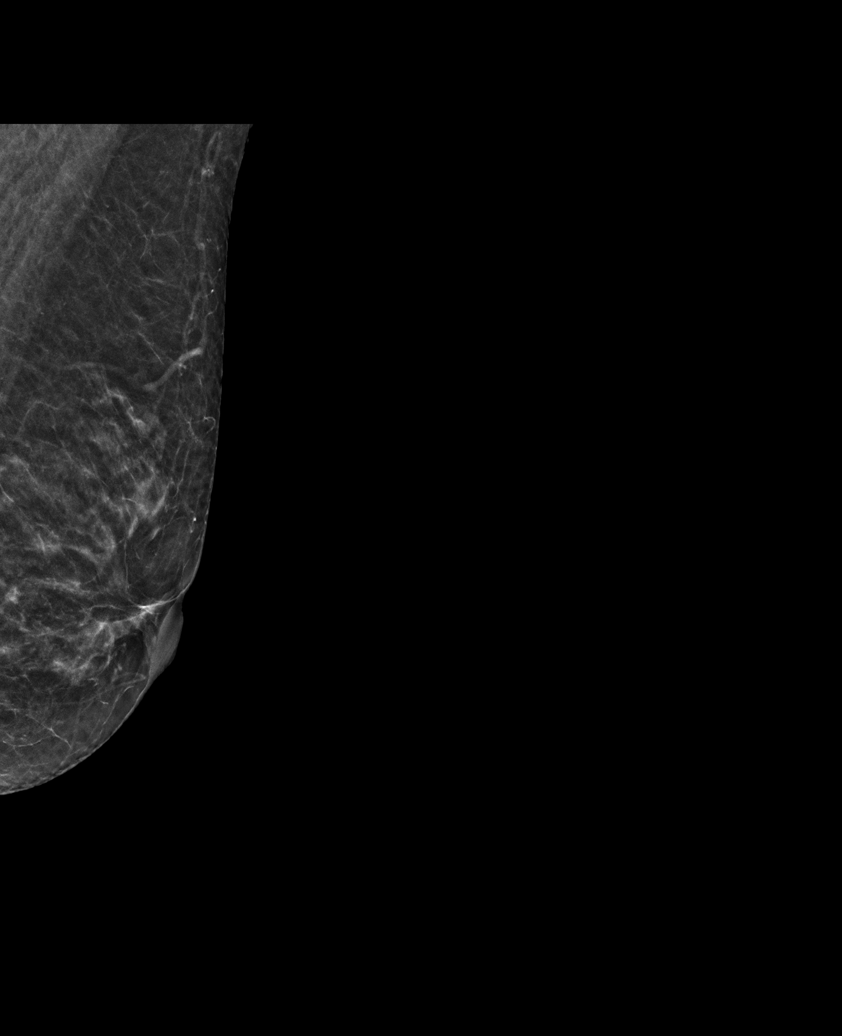

[L MLOID BREAST TOMOSYNTHESIS IMAGE tomo · tomo slice 24/47.0]
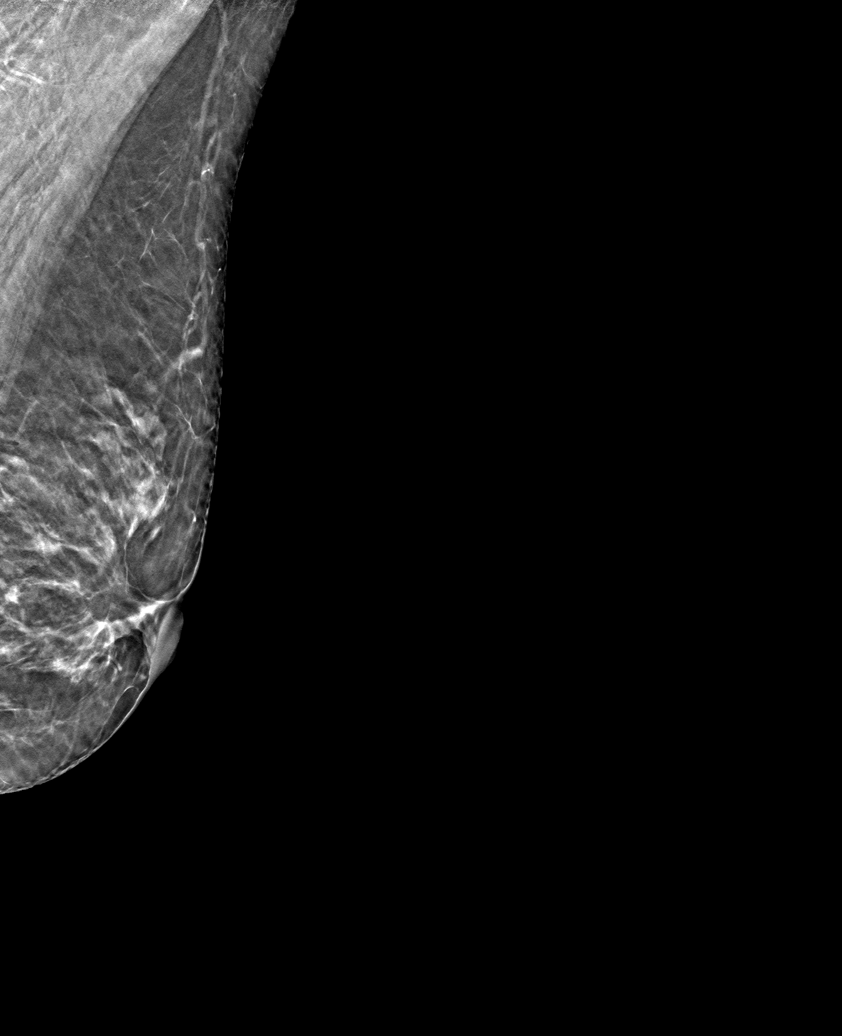

[L CCID BREAST TOMOSYNTHESIS IMAGE tomo · tomo slice 21/42.0]
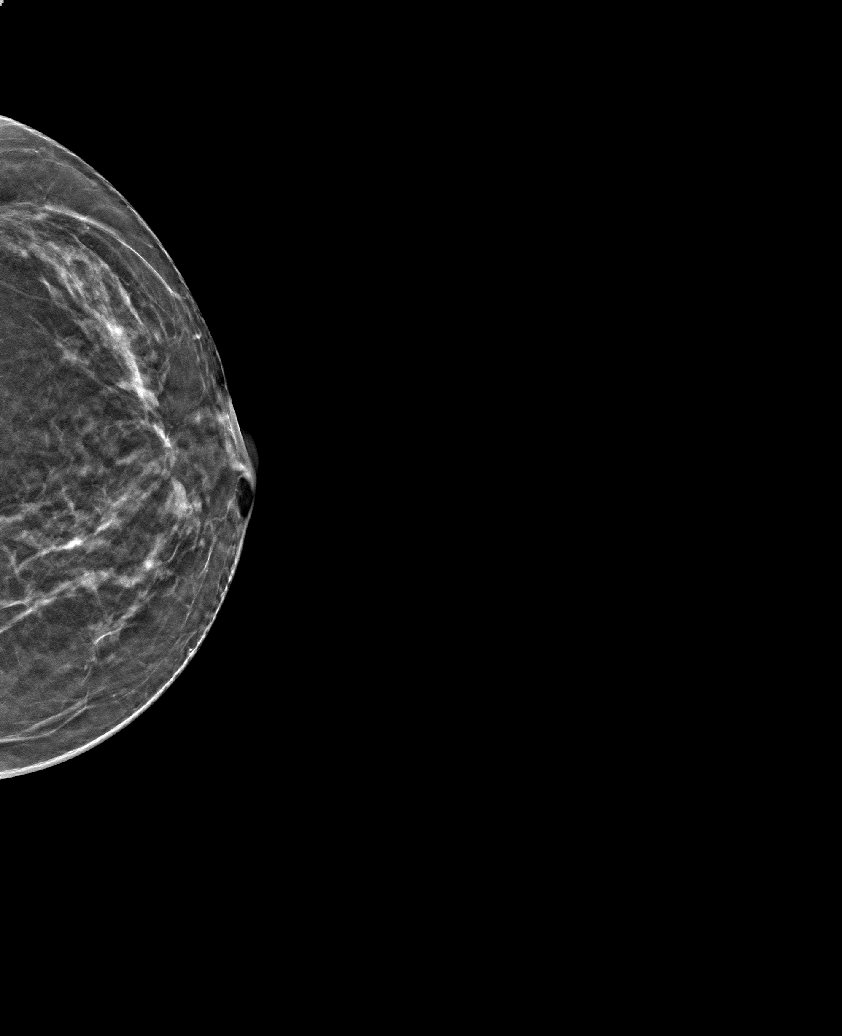

[6 of 14 positions shown; findings below may reference images not displayed]

ACR Breast Density Category b: There are scattered areas of
fibroglandular density.
FINDINGS: The patient has retropectoral implants. Patient has had a right
mastectomy. There are no findings suspicious for malignancy in the
left breast.
IMPRESSION: No mammographic evidence of malignancy. A result letter of this
screening mammogram will be mailed directly to the patient.

RECOMMENDATION:
Screening mammogram in one year. (Code:UI-[id number])

BI-RADS CATEGORY  1:  Negative.
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
## 2022-07-15 ENCOUNTER — Ambulatory Visit: Payer: Medicare PPO | Attending: Internal Medicine | Admitting: Internal Medicine

## 2022-07-15 ENCOUNTER — Encounter: Payer: Self-pay | Admitting: Internal Medicine

## 2022-07-15 ENCOUNTER — Ambulatory Visit (INDEPENDENT_AMBULATORY_CARE_PROVIDER_SITE_OTHER): Payer: Medicare PPO

## 2022-07-15 VITALS — BP 130/78 | HR 70 | Ht 67.0 in | Wt 124.0 lb

## 2022-07-15 DIAGNOSIS — I422 Other hypertrophic cardiomyopathy: Secondary | ICD-10-CM

## 2022-07-15 DIAGNOSIS — R002 Palpitations: Secondary | ICD-10-CM | POA: Diagnosis not present

## 2022-07-15 DIAGNOSIS — C50211 Malignant neoplasm of upper-inner quadrant of right female breast: Secondary | ICD-10-CM | POA: Diagnosis not present

## 2022-07-15 DIAGNOSIS — Z170 Estrogen receptor positive status [ER+]: Secondary | ICD-10-CM | POA: Diagnosis not present

## 2022-07-15 NOTE — Progress Notes (Unsigned)
Enrolled patient for a 7 day Zio XT monitor to be mailed to patients home.  

## 2022-07-15 NOTE — Patient Instructions (Addendum)
Medication Instructions:  Your physician recommends that you continue on your current medications as directed. Please refer to the Current Medication list given to you today.  *If you need a refill on your cardiac medications before your next appointment, please call your pharmacy*   Lab Work: NONE If you have labs (blood work) drawn today and your tests are completely normal, you will receive your results only by: [place] (if you have MyChart) OR A paper copy in the mail If you have any lab test that is abnormal or we need to change your treatment, we will call you to review the results.   Testing/Procedures: Your physician has requested that you have an exercise tolerance test. For further information please visit [email or web address]. Please also follow instruction sheet, as given.  Your physician has requested that you wear a 7 day heart monitor.  Your physician has referred you to our Dietitian.    Follow-Up: At [facility], you and your health needs are our priority.  As part of our continuing mission to provide you with exceptional heart care, we have created designated Provider Care Teams.  These Care Teams include your primary Cardiologist (physician) and Advanced Practice Providers (APPs -  Physician Assistants and Nurse Practitioners) who all work together to provide you with the care you need, when you need it.   Your next appointment:   6 month(s)  Provider:   Werner Lean, MD     Other Instructions Bryn Gulling- Long Term Monitor Instructions  Your physician has requested you wear a ZIO patch monitor for 7 days.  This is a single patch monitor. [employer] supplies one patch monitor per enrollment. Additional stickers are not available. Please do not apply patch if you will be having a Nuclear Stress Test,  Echocardiogram, Cardiac CT, MRI, or Chest Xray during the period you would be wearing the  monitor. The patch cannot be worn during  these tests. You cannot remove and re-apply the  ZIO XT patch monitor.  Your ZIO patch monitor will be mailed 3 day USPS to your address on file. It may take 3-5 days  to receive your monitor after you have been enrolled.  Once you have received your monitor, please review the enclosed instructions. Your monitor  has already been registered assigning a specific monitor serial # to you.  Billing and Patient Assistance Program Information  We have supplied [employer] with any of your insurance information on file for billing purposes. [employer] offers a sliding scale Patient Assistance Program for patients that do not have  insurance, or whose insurance does not completely cover the cost of the ZIO monitor.  You must apply for the Patient Assistance Program to qualify for this discounted rate.  To apply, please call [employer] at [phone], select option 4, select option 2, ask to apply for  Patient Assistance Program. Theodore Demark will ask your household income, and how many people  are in your household. They will quote your out-of-pocket cost based on that information.  [employer] will also be able to set up a 40-month, interest-free payment plan if needed.  Applying the monitor   Shave hair from upper left chest.  Hold abrader disc by orange tab. Rub abrader in 40 strokes over the upper left chest as  indicated in your monitor instructions.  Clean area with 4 enclosed alcohol pads. Let dry.  Apply patch as indicated in monitor instructions. Patch will be placed under collarbone on left  side of chest with  arrow pointing upward.  Rub patch adhesive wings for 2 minutes. Remove white label marked "1". Remove the white  label marked "2". Rub patch adhesive wings for 2 additional minutes.  While looking in a mirror, press and release button in center of patch. A small green light will  flash 3-4 times. This will be your only indicator that the monitor has been turned on.  Do not shower for the first 24  hours. You may shower after the first 24 hours.  Press the button if you feel a symptom. You will hear a small click. Record Date, Time and  Symptom in the Patient Logbook.  When you are ready to remove the patch, follow instructions on the last 2 pages of Patient  Logbook. Stick patch monitor onto the last page of Patient Logbook.  Place Patient Logbook in the blue and white box. Use locking tab on box and tape box closed  securely. The blue and white box has prepaid postage on it. Please place it in the mailbox as  soon as possible. Your physician should have your test results approximately 7 days after the  monitor has been mailed back to [facility].  Call [place] at [phone] if you have questions regarding  your ZIO XT patch monitor. Call them immediately if you see an orange light blinking on your  monitor.  If your monitor falls off in less than 4 days, contact our Monitor department at [phone].  If your monitor becomes loose or falls off after 4 days call [employer] at [phone] for  suggestions on securing your monitor

## 2022-07-20 DIAGNOSIS — R002 Palpitations: Secondary | ICD-10-CM | POA: Diagnosis not present

## 2022-07-20 DIAGNOSIS — I422 Other hypertrophic cardiomyopathy: Secondary | ICD-10-CM | POA: Diagnosis not present

## 2022-07-26 ENCOUNTER — Ambulatory Visit: Payer: Medicare PPO | Attending: Internal Medicine

## 2022-07-26 ENCOUNTER — Encounter: Payer: Self-pay | Admitting: Internal Medicine

## 2022-07-26 DIAGNOSIS — R002 Palpitations: Secondary | ICD-10-CM | POA: Diagnosis not present

## 2022-07-26 DIAGNOSIS — I422 Other hypertrophic cardiomyopathy: Secondary | ICD-10-CM | POA: Diagnosis not present

## 2022-07-26 LAB — EXERCISE TOLERANCE TEST
Angina Index: 0
Duke Treadmill Score: 7
Estimated workload: 7.7
Exercise duration (min): 6 min
Exercise duration (sec): 31 s
MPHR: 147 {beats}/min
Peak HR: 146 {beats}/min
Percent HR: 99 %
RPE: 15
Rest HR: 80 {beats}/min
ST Depression (mm): 0 mm

## 2022-08-05 ENCOUNTER — Encounter: Payer: Self-pay | Admitting: Internal Medicine

## 2022-08-05 DIAGNOSIS — I422 Other hypertrophic cardiomyopathy: Secondary | ICD-10-CM | POA: Diagnosis not present

## 2022-08-05 DIAGNOSIS — R002 Palpitations: Secondary | ICD-10-CM | POA: Diagnosis not present

## 2022-08-08 DIAGNOSIS — Z01419 Encounter for gynecological examination (general) (routine) without abnormal findings: Secondary | ICD-10-CM | POA: Diagnosis not present

## 2022-08-08 DIAGNOSIS — Z6820 Body mass index (BMI) 20.0-20.9, adult: Secondary | ICD-10-CM | POA: Diagnosis not present

## 2022-08-24 DIAGNOSIS — R7303 Prediabetes: Secondary | ICD-10-CM | POA: Diagnosis not present

## 2022-08-24 DIAGNOSIS — M810 Age-related osteoporosis without current pathological fracture: Secondary | ICD-10-CM | POA: Diagnosis not present

## 2022-08-24 DIAGNOSIS — I10 Essential (primary) hypertension: Secondary | ICD-10-CM | POA: Diagnosis not present

## 2022-08-24 DIAGNOSIS — Z0000 Encounter for general adult medical examination without abnormal findings: Secondary | ICD-10-CM | POA: Diagnosis not present

## 2022-08-24 DIAGNOSIS — I712 Thoracic aortic aneurysm, without rupture, unspecified: Secondary | ICD-10-CM | POA: Diagnosis not present

## 2022-08-24 DIAGNOSIS — R7309 Other abnormal glucose: Secondary | ICD-10-CM | POA: Diagnosis not present

## 2022-08-24 DIAGNOSIS — I720 Aneurysm of carotid artery: Secondary | ICD-10-CM | POA: Diagnosis not present

## 2022-08-24 DIAGNOSIS — E782 Mixed hyperlipidemia: Secondary | ICD-10-CM | POA: Diagnosis not present

## 2022-08-24 DIAGNOSIS — I2510 Atherosclerotic heart disease of native coronary artery without angina pectoris: Secondary | ICD-10-CM | POA: Diagnosis not present

## 2022-08-24 DIAGNOSIS — I422 Other hypertrophic cardiomyopathy: Secondary | ICD-10-CM | POA: Diagnosis not present

## 2022-11-04 ENCOUNTER — Encounter: Payer: Self-pay | Admitting: Genetic Counselor

## 2022-11-08 ENCOUNTER — Ambulatory Visit: Payer: Medicare PPO | Attending: Genetic Counselor | Admitting: Genetic Counselor

## 2022-11-08 ENCOUNTER — Encounter: Payer: Medicare PPO | Admitting: Genetic Counselor

## 2022-11-09 NOTE — Progress Notes (Signed)
Referring Provider: Riley Lam, MD  Pre Test Genetic Consult  Referral Reason  Christina Christina Daniel is referred for genetic consult and testing of hypertrophic cardiomyopathy.  Personal Medical Information Christina Christina Daniel (III.1 on pedigree) is a very pleasant 72 year old Asian lady, originally from [place], born in [place] and raised in [place]. She reports being seen by Dr. Katrinka Blazing for an aneurysm of 3.8cm in her ascending aorta since 2013 that has remained stable over the years. When Dr. Katrinka Blazing was retiring in 2023, he informed her of having an abnormal EKG and recommended regular cardiac follow-up with Dr. Izora Ribas for HCM.  Subsequent cardiac MRI detected hypertrophy of the apex with LVEF of 56%, no aneurysm and mild scar burden. She denies having symptoms of dyspnea, fatigue, chest discomfort, heart palpitations, dizziness, or syncope. She leads an active life and regularly walks 5 miles a day.  Traditional Risk Factors Christina Christina Daniel was diagnosed with hypertension at age 22 and reports that it is well-controlled with medication.  Family history  Relation to Proband Pedigree # Current age Heart condition/age of onset Notes  Son IV.1 36 None Echo/EKG to be done  Daughter IV.2 36 None Echo/EKG to be done  Grandkids, 3 V.1-V.3 8,7, 4 None         Brother III.2 Deceased None Died @ 43 from Hodgkin's lymphoma  Sisters, 2 III.3-III.4 65, 59 Unaware Estranged from her; both live in [place], [place]  Niece, nephews IV.3-IV.8 IV.7-deceased 39-34 None IV.7- Died @ 35 in sleep. Not aware of details related to death        Father II.1 Deceased M.I. and 3-vessel CABG @ 81s Died of old age @ 76   Paternal uncles/aunts Unknown Deceased  unaware  All lived in [place]- not in touch   Paternal grandfather I.1 Deceased Unaware Died @ 70s - old age  Paternal grandmother I.2 Deceased unaware Died @ 2s - old age        Mother II.2 Deceased Heart palpitations, pacemaker @ 85s Died @ 46 from sepsis due to  infected gall bladder  Maternal uncles, 3 II.3-II.5 90s None   Maternal grandfather I.3 Deceased Unaware Died @ 66 - old age  Maternal grandmother I.4 Deceased Unaware Died @ 72 - old age    Genetics Christina Daniel was counseled on the genetics of hypertrophic cardiomyopathy (HCM). I explained to the patient that this is an autosomal dominant condition with incomplete penetrance i.e. not all individuals harboring the HCM mutation will present clinically with HCM, and age-related penetrance where clinical presentation of HCM increases with advanced age. Variability in clinical expression is also seen in families with HCM with affected family members presenting clinically at different ages and with symptoms ranging from mild to severe.  Since HCM is an autosomal dominant condition, first degree-relatives should seek regular surveillance for HCM.  First-degree relatives, include her son, daughter, and the two estranged sisters. Clinical screening of first-degree relatives involves echocardiogram and EKG at regular intervals, frequency is typically determined by age, with those in their teens undergoing screening every year until the age of 46 and those over the age of 66 getting screened every 3-5 years. Patient verbalized understanding of this.  I informed the patient that about 8-10% of HCM patients can have compound and digenic mutations for HCM. Also briefly discussed the inheritance pattern and treatment /management plans for the infiltrative cardiomyopathies that present as HCM phenocopies.   Genetic testing is a probabilistic test dependent upon age and severity of presentation, presence of risk factors for  HCM and importantly family history of HCM or sudden death in first-degree relatives.   If a mutation is not identified, then all first-degree relatives should undergo regular screening for HCM. I emphasized that even if the genetic test is negative, it does not mean that he does not have HCM. A negative  test result can be due to limitations of the genetic test.   There is also the likelihood of identifying a "Variant of unknown significance." This result means that the variant has not been detected in a statistically significant number of HCM patients and/or functional studies have not been performed to verify its pathogenicity. This VUS can be tested in the family to see if it segregates with disease. If a VUS is found, first-degree relatives should undergo regular clinical screening for HCM.  If a pathogenic variant is reported, then first-degree family members can get tested for this variant. If they test positive, it is likely they will develop HCM. In light of variable expression and incomplete penetrance associated with HCM, it is not possible to predict when they will manifest clinically with HCM. It is recommended that family members that test positive for the familial pathogenic variant pursue clinical screening for HCM. Family members that test negative for the familial mutation need not pursue periodic screening for HCM, but seek care if symptoms develop.   Impression  Christina Daniel was found to have apical cardiac wall thickness suggestive of HCM at age 30 in the absence of other loading conditions that can cause cardiac hypertrophy. She is asymptomatic and does not report HCM in her first-degree relatives. Does mention that her mother was symptomatic and had a pacemaker. Also reports sudden death in her nephew at 46 but does not have details regarding the circumstances leading to his death or autopsy report. It is likely that she has a de novo mutation for HCM or may have inherited this condition from her mother.  Genetic testing for HCM is recommended. This test should include the sarcomeric genes, namely MYBPC3, MYH7, TNNI3, TNNT2, TPM1, ACTC1, MYL2, MYL3 implicated in HCM as well as the genes for the HCM phenocopies, such as Fabry disease (GLA), Danon disease (LAMP2), WPW syndrome (PRKAG2) and  familial transthyretin amyloidosis (TTR) and phospholamban (PLN)  In addition, we discussed the protections afforded by the Genetic Information Non-Discrimination Act (GINA). I explained to the patient that GINA protects them from losing their employment or health insurance based on their genotype. However, these protections do not cover life insurance and disability. Patient verbalized understanding of this and states that her children do not have life insurance coverage.  Please note that the patient has not been counseled in this visit on other personal, cultural or ethical issues that they may face due to their heart condition.   Plan After a thorough discussion of the risk and benefits of genetic testing for HCM, Refugio declines HCM genetic testing due to insurance non-coverage. She will discuss HCM screening guidelines with her children and her sisters.    Sidney Ace, Ph.D, [facility] Clinical Molecular Geneticist

## 2022-12-05 ENCOUNTER — Inpatient Hospital Stay: Payer: Medicare PPO

## 2022-12-05 ENCOUNTER — Inpatient Hospital Stay: Payer: Medicare PPO | Admitting: Hematology and Oncology

## 2022-12-27 ENCOUNTER — Other Ambulatory Visit: Payer: Self-pay

## 2022-12-27 DIAGNOSIS — Z170 Estrogen receptor positive status [ER+]: Secondary | ICD-10-CM

## 2022-12-28 ENCOUNTER — Inpatient Hospital Stay: Payer: Medicare PPO | Attending: Hematology and Oncology

## 2022-12-28 ENCOUNTER — Inpatient Hospital Stay: Payer: Medicare PPO | Admitting: Hematology and Oncology

## 2022-12-28 ENCOUNTER — Encounter: Payer: Self-pay | Admitting: Hematology and Oncology

## 2022-12-28 VITALS — BP 136/85 | HR 71 | Temp 97.5°F | Resp 18 | Ht 67.0 in | Wt 126.4 lb

## 2022-12-28 DIAGNOSIS — Z90722 Acquired absence of ovaries, bilateral: Secondary | ICD-10-CM | POA: Diagnosis not present

## 2022-12-28 DIAGNOSIS — C50211 Malignant neoplasm of upper-inner quadrant of right female breast: Secondary | ICD-10-CM

## 2022-12-28 DIAGNOSIS — Z170 Estrogen receptor positive status [ER+]: Secondary | ICD-10-CM

## 2022-12-28 DIAGNOSIS — Z90710 Acquired absence of both cervix and uterus: Secondary | ICD-10-CM | POA: Diagnosis not present

## 2022-12-28 DIAGNOSIS — Z9011 Acquired absence of right breast and nipple: Secondary | ICD-10-CM | POA: Insufficient documentation

## 2022-12-28 DIAGNOSIS — Z8542 Personal history of malignant neoplasm of other parts of uterus: Secondary | ICD-10-CM | POA: Insufficient documentation

## 2022-12-28 DIAGNOSIS — Z853 Personal history of malignant neoplasm of breast: Secondary | ICD-10-CM | POA: Insufficient documentation

## 2022-12-28 LAB — CBC WITH DIFFERENTIAL (CANCER CENTER ONLY)
Abs Immature Granulocytes: 0.01 10*3/uL (ref 0.00–0.07)
Basophils Absolute: 0 10*3/uL (ref 0.0–0.1)
Basophils Relative: 0 %
Eosinophils Absolute: 0 10*3/uL (ref 0.0–0.5)
Eosinophils Relative: 0 %
HCT: 42.2 % (ref 36.0–46.0)
Hemoglobin: 14.7 g/dL (ref 12.0–15.0)
Immature Granulocytes: 0 %
Lymphocytes Relative: 21 %
Lymphs Abs: 1.1 10*3/uL (ref 0.7–4.0)
MCH: 32 pg (ref 26.0–34.0)
MCHC: 34.8 g/dL (ref 30.0–36.0)
MCV: 91.7 fL (ref 80.0–100.0)
Monocytes Absolute: 0.4 10*3/uL (ref 0.1–1.0)
Monocytes Relative: 7 %
Neutro Abs: 3.8 10*3/uL (ref 1.7–7.7)
Neutrophils Relative %: 72 %
Platelet Count: 243 10*3/uL (ref 150–400)
RBC: 4.6 MIL/uL (ref 3.87–5.11)
RDW: 13.2 % (ref 11.5–15.5)
WBC Count: 5.3 10*3/uL (ref 4.0–10.5)
nRBC: 0 % (ref 0.0–0.2)

## 2022-12-28 LAB — CMP (CANCER CENTER ONLY)
ALT: 19 U/L (ref 0–44)
AST: 19 U/L (ref 15–41)
Albumin: 4.6 g/dL (ref 3.5–5.0)
Alkaline Phosphatase: 64 U/L (ref 38–126)
Anion gap: 5 (ref 5–15)
BUN: 24 mg/dL — ABNORMAL HIGH (ref 8–23)
CO2: 33 mmol/L — ABNORMAL HIGH (ref 22–32)
Calcium: 10 mg/dL (ref 8.9–10.3)
Chloride: 102 mmol/L (ref 98–111)
Creatinine: 0.62 mg/dL (ref 0.44–1.00)
GFR, Estimated: >60 mL/min (ref >60)
Glucose, Bld: 89 mg/dL (ref 70–99)
Potassium: 3.9 mmol/L (ref 3.5–5.1)
Sodium: 140 mmol/L (ref 135–145)
Total Bilirubin: 1.3 mg/dL — ABNORMAL HIGH (ref 0.3–1.2)
Total Protein: 7.6 g/dL (ref 6.5–8.1)

## 2022-12-28 NOTE — Progress Notes (Signed)
Patient Care Team: Ileana Ladd, MD (Inactive) as PCP - General (Family Medicine) [name], MD as PCP - Cardiology (Cardiology) Sigmund Hazel, MD as Consulting Physician (Family Medicine) Richardean Chimera, MD as Consulting Physician (Obstetrics and Gynecology) Lyn Records, MD (Inactive) as Consulting Physician (Cardiology) Serena Croissant, MD as Consulting Physician (Hematology and Oncology)  DIAGNOSIS:  Encounter Diagnosis  Name Primary?   Malignant neoplasm of upper-inner quadrant of right breast in female, estrogen receptor positive (HCC) Yes    SUMMARY OF ONCOLOGIC HISTORY: Oncology History  Endometrial cancer (HCC)  04/01/2000 Surgery   Robotic assisted total hysterectomy, BSO, SLN biopsy (right) and left pelvic lymphadenectomy for unilateral mapping Intraoperative findings were significant for a 10cm normal appearing uterus, normal tubes and ovaries, no gross extrauterine disease, unilateral mapping to the right. Surgery was uncomplicated.    03/20/2020 Initial Diagnosis   Endometrial cancer (HCC) EMB: FIGO Gr 1 endometrioid EC   04/01/2020 Pathologic Stage   FIGO grade 1, stage IA endometrioid endometrial adenocarcinoma. There was 1 of 16mm myometrial invasion (inner half) with no LVSI. There was negative nodes, cervix and adnexa. MMR normal/preserved.     CHIEF COMPLIANT: Breast cancer surveillance  INTERVAL HISTORY: Christina Daniel is a 72 y.o with the above mentioned. She presents to the clinic today for a follow-up. Patient reports that she has been doing very good. She has no new concerns or symptoms to report to the clinic. She has been doing swimming for exercise. She states that it benefits her more than walking. She denies any pain or discomfort in breast.   ALLERGIES:  is allergic to sulfamethoxazole-trimethoprim, ezetimibe-simvastatin, naprosyn [naproxen], niacin, sulfa antibiotics, and tramadol.  MEDICATIONS:  Current Outpatient Medications   Medication Sig Dispense Refill   amLODipine (NORVASC) 2.5 MG tablet Take 2.5 mg by mouth daily.     aspirin 81 MG EC tablet Take 81 mg by mouth daily.     Calcium Carbonate-Vitamin D (CALCIUM-VITAMIN D3 PO) Take 600 mg by mouth 2 (two) times daily.     cholecalciferol (VITAMIN D3) 25 MCG (1000 UNIT) tablet Take 1 tablet (1,000 Units total) by mouth daily.     hydrochlorothiazide (HYDRODIURIL) 12.5 MG tablet Take 1 tablet by mouth daily.     metoprolol succinate (TOPROL-XL) 25 MG 24 hr tablet TAKE ONE TABLET BY MOUTH ONCE DAILY 90 tablet 0   Multiple Vitamins-Minerals (CENTRUM SILVER PO) Take 1 tablet by mouth daily.     omega-3 acid ethyl esters (LOVAZA) 1 G capsule Take 1 g by mouth 3 (three) times daily.     simvastatin (ZOCOR) 20 MG tablet Take 20 mg by mouth at bedtime.     No current facility-administered medications for this visit.    PHYSICAL EXAMINATION: ECOG PERFORMANCE STATUS: 1 - Symptomatic but completely ambulatory  Vitals:   12/28/22 0956  BP: 136/85  Pulse: 71  Resp: 18  Temp: (!) 97.5 F (36.4 C)  SpO2: 100%   Filed Weights   12/28/22 0956  Weight: 126 lb 6.4 oz (57.3 kg)    BREAST: No palpable masses or nodules in either right or left breasts. No palpable axillary supraclavicular or infraclavicular adenopathy no breast tenderness or nipple discharge. (exam performed in the presence of a chaperone)  LABORATORY DATA:  I have reviewed the data as listed    Latest Ref Rng & Units 12/28/2022    9:32 AM 12/03/2021   10:59 AM 01/12/2021    2:10 PM  CMP  Glucose 70 -  99 mg/dL 89  97  98   BUN 8 - 23 mg/dL 24  21  22    Creatinine 0.44 - 1.00 mg/dL 1.30  8.65  7.84   Sodium 135 - 145 mmol/L 140  140  143   Potassium 3.5 - 5.1 mmol/L 3.9  3.8  4.1   Chloride 98 - 111 mmol/L 102  103  103   CO2 22 - 32 mmol/L 33  32  29   Calcium 8.9 - 10.3 mg/dL 69.6  9.7  9.7   Total Protein 6.5 - 8.1 g/dL 7.6  7.5  7.5   Total Bilirubin 0.3 - 1.2 mg/dL 1.3  1.3  0.8   Alkaline  Phos 38 - 126 U/L 64  66  87   AST 15 - 41 U/L 19  19  17    ALT 0 - 44 U/L 19  18  19      Lab Results  Component Value Date   WBC 5.3 12/28/2022   HGB 14.7 12/28/2022   HCT 42.2 12/28/2022   MCV 91.7 12/28/2022   PLT 243 12/28/2022   NEUTROABS 3.8 12/28/2022    ASSESSMENT & PLAN:  Malignant neoplasm of upper-inner quadrant of right breast in female, estrogen receptor positive (HCC) 1993: DCIS right breast UOQ 02/22/2012: Right mastectomy with reconstruction with latissimus muscle T1CN1 stage IIa grade 2 IDC ER/PR positive HER2 positive ratio 3.2 through 04/2012- 04/2013: Postmastectomy radiation 04/11/2012: Abraxane [name] carboplatin followed by [name] maintenance 08/2012-01/2020: Adjuvant tamoxifen x7 years 04/01/20: Hysterectomy with bilateral salpingo-oophorectomy for endometrioid carcinoma T1 a N0 grade 1 with normal mismatch repair proteins   Breast cancer surveillance: 1.  Breast examination: 12/28/2022: Benign 2. mammogram left breast 04/22/2022: Benign breast density category B   Return to clinic in 1 year for follow-up      No orders of the defined types were placed in this encounter.  The patient has a good understanding of the overall plan. she agrees with it. she will call with any problems that may develop before the next visit here. Total time spent: 30 mins including face to face time and time spent for planning, charting and co-ordination of care   Tamsen Meek, MD 12/28/22    I Janan Ridge am acting as a Neurosurgeon for [employer]  I have reviewed the above documentation for accuracy and completeness, and I agree with the above.

## 2022-12-28 NOTE — Assessment & Plan Note (Signed)
1993: DCIS right breast UOQ 02/22/2012: Right mastectomy with reconstruction with latissimus muscle T1CN1 stage IIa grade 2 IDC ER/PR positive HER2 positive ratio 3.2 through 04/2012- 04/2013: Postmastectomy radiation 04/11/2012: Abraxane [name] carboplatin followed by [name] maintenance 08/2012-01/2020: Adjuvant tamoxifen x7 years 04/01/20: Hysterectomy with bilateral salpingo-oophorectomy for endometrioid carcinoma T1 a N0 grade 1 with normal mismatch repair proteins   Breast cancer surveillance: 1.  Breast examination: 12/28/2022: Benign 2. mammogram left breast 04/22/2022: Benign breast density category B   Return to clinic in 1 year for follow-up

## 2023-01-10 ENCOUNTER — Encounter: Payer: Medicare PPO | Admitting: Genetic Counselor

## 2023-01-17 DIAGNOSIS — H40033 Anatomical narrow angle, bilateral: Secondary | ICD-10-CM | POA: Diagnosis not present

## 2023-01-17 DIAGNOSIS — H25813 Combined forms of age-related cataract, bilateral: Secondary | ICD-10-CM | POA: Diagnosis not present

## 2023-01-18 ENCOUNTER — Encounter: Payer: Self-pay | Admitting: Internal Medicine

## 2023-01-18 ENCOUNTER — Ambulatory Visit: Payer: Medicare PPO | Attending: Internal Medicine | Admitting: Internal Medicine

## 2023-01-18 VITALS — BP 122/84 | HR 90 | Ht 66.0 in | Wt 124.0 lb

## 2023-01-18 DIAGNOSIS — I10 Essential (primary) hypertension: Secondary | ICD-10-CM | POA: Diagnosis not present

## 2023-01-18 DIAGNOSIS — I2510 Atherosclerotic heart disease of native coronary artery without angina pectoris: Secondary | ICD-10-CM

## 2023-01-18 DIAGNOSIS — I422 Other hypertrophic cardiomyopathy: Secondary | ICD-10-CM | POA: Diagnosis not present

## 2023-01-18 DIAGNOSIS — R002 Palpitations: Secondary | ICD-10-CM | POA: Diagnosis not present

## 2023-01-18 NOTE — Patient Instructions (Signed)
Medication Instructions:  Your physician has recommended you make the following change in your medication:  STOP: hydrochlorothiazide   *If you need a refill on your cardiac medications before your next appointment, please call your pharmacy*   Lab Work: IN 1-2 WEEKS: BMP If you have labs (blood work) drawn today and your tests are completely normal, you will receive your results only by: MyChart Message (if you have MyChart) OR A paper copy in the mail If you have any lab test that is abnormal or we need to change your treatment, we will call you to review the results.   Testing/Procedures: NONE   Follow-Up: At [facility], you and your health needs are our priority.  As part of our continuing mission to provide you with exceptional heart care, we have created designated Provider Care Teams.  These Care Teams include your primary Cardiologist (physician) and Advanced Practice Providers (APPs -  Physician Assistants and Nurse Practitioners) who all work together to provide you with the care you need, when you need it.    Your next appointment:   1 year(s)  Provider:   [name], MD
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
## 2023-01-26 ENCOUNTER — Encounter: Payer: Self-pay | Admitting: Internal Medicine

## 2023-01-26 DIAGNOSIS — I10 Essential (primary) hypertension: Secondary | ICD-10-CM

## 2023-01-27 ENCOUNTER — Ambulatory Visit: Payer: Medicare PPO | Attending: Internal Medicine

## 2023-01-27 DIAGNOSIS — I422 Other hypertrophic cardiomyopathy: Secondary | ICD-10-CM

## 2023-01-27 LAB — BASIC METABOLIC PANEL
BUN/Creatinine Ratio: 23 (ref 12–28)
BUN: 15 mg/dL (ref 8–27)
CO2: 25 mmol/L (ref 20–29)
Calcium: 9.4 mg/dL (ref 8.7–10.3)
Chloride: 103 mmol/L (ref 96–106)
Creatinine, Ser: 0.65 mg/dL (ref 0.57–1.00)
Glucose: 99 mg/dL (ref 70–99)
Potassium: 4.1 mmol/L (ref 3.5–5.2)
Sodium: 143 mmol/L (ref 134–144)
eGFR: 92 mL/min/{1.73_m2} (ref >59)

## 2023-01-31 ENCOUNTER — Ambulatory Visit: Payer: Medicare PPO

## 2023-02-22 MED ORDER — HYDROCHLOROTHIAZIDE 12.5 MG PO CAPS: 12.5000 mg | ORAL_CAPSULE | Freq: Every day | ORAL | 3 refills | Status: AC

## 2023-02-22 NOTE — Addendum Note (Signed)
Addended by: Macie Burows on: 02/22/2023 05:37 PM   Modules accepted: Orders

## 2023-03-08 ENCOUNTER — Other Ambulatory Visit: Payer: Self-pay | Admitting: Family Medicine

## 2023-03-08 DIAGNOSIS — Z1231 Encounter for screening mammogram for malignant neoplasm of breast: Secondary | ICD-10-CM

## 2023-03-14 ENCOUNTER — Encounter: Payer: Self-pay | Admitting: Hematology and Oncology

## 2023-03-15 ENCOUNTER — Encounter: Payer: Self-pay | Admitting: Internal Medicine

## 2023-03-15 ENCOUNTER — Encounter: Payer: Self-pay | Admitting: Family Medicine

## 2023-03-15 DIAGNOSIS — E782 Mixed hyperlipidemia: Secondary | ICD-10-CM | POA: Diagnosis not present

## 2023-03-15 DIAGNOSIS — I422 Other hypertrophic cardiomyopathy: Secondary | ICD-10-CM | POA: Diagnosis not present

## 2023-03-15 DIAGNOSIS — I10 Essential (primary) hypertension: Secondary | ICD-10-CM | POA: Diagnosis not present

## 2023-03-15 LAB — LAB REPORT - SCANNED: EGFR: 94

## 2023-03-19 DIAGNOSIS — E785 Hyperlipidemia, unspecified: Secondary | ICD-10-CM | POA: Diagnosis not present

## 2023-03-19 DIAGNOSIS — N181 Chronic kidney disease, stage 1: Secondary | ICD-10-CM | POA: Diagnosis not present

## 2023-03-19 DIAGNOSIS — H269 Unspecified cataract: Secondary | ICD-10-CM | POA: Diagnosis not present

## 2023-03-19 DIAGNOSIS — M810 Age-related osteoporosis without current pathological fracture: Secondary | ICD-10-CM | POA: Diagnosis not present

## 2023-03-19 DIAGNOSIS — I2510 Atherosclerotic heart disease of native coronary artery without angina pectoris: Secondary | ICD-10-CM | POA: Diagnosis not present

## 2023-03-19 DIAGNOSIS — Z8249 Family history of ischemic heart disease and other diseases of the circulatory system: Secondary | ICD-10-CM | POA: Diagnosis not present

## 2023-03-19 DIAGNOSIS — Z9181 History of falling: Secondary | ICD-10-CM | POA: Diagnosis not present

## 2023-03-19 DIAGNOSIS — I719 Aortic aneurysm of unspecified site, without rupture: Secondary | ICD-10-CM | POA: Diagnosis not present

## 2023-03-19 DIAGNOSIS — I422 Other hypertrophic cardiomyopathy: Secondary | ICD-10-CM | POA: Diagnosis not present

## 2023-04-03 DIAGNOSIS — H5201 Hypermetropia, right eye: Secondary | ICD-10-CM | POA: Diagnosis not present

## 2023-04-03 DIAGNOSIS — H25013 Cortical age-related cataract, bilateral: Secondary | ICD-10-CM | POA: Diagnosis not present

## 2023-04-03 DIAGNOSIS — H52203 Unspecified astigmatism, bilateral: Secondary | ICD-10-CM | POA: Diagnosis not present

## 2023-04-03 DIAGNOSIS — H2513 Age-related nuclear cataract, bilateral: Secondary | ICD-10-CM | POA: Diagnosis not present

## 2023-04-24 ENCOUNTER — Ambulatory Visit
Admission: RE | Admit: 2023-04-24 | Discharge: 2023-04-24 | Disposition: A | Discharge status: Home / Self Care | Payer: Medicare PPO | Source: Ambulatory Visit | Attending: Family Medicine | Admitting: Family Medicine

## 2023-04-24 DIAGNOSIS — Z1231 Encounter for screening mammogram for malignant neoplasm of breast: Secondary | ICD-10-CM | POA: Diagnosis not present

## 2023-05-17 ENCOUNTER — Telehealth: Payer: Self-pay | Admitting: Hematology and Oncology

## 2023-05-17 NOTE — Telephone Encounter (Signed)
 Patient called to change appointment date. Patient also request to be scheduled with PA Loris Ros. Patient rescheduled for 9/3@1020 

## 2023-05-31 DIAGNOSIS — L814 Other melanin hyperpigmentation: Secondary | ICD-10-CM | POA: Diagnosis not present

## 2023-05-31 DIAGNOSIS — L821 Other seborrheic keratosis: Secondary | ICD-10-CM | POA: Diagnosis not present

## 2023-05-31 DIAGNOSIS — L578 Other skin changes due to chronic exposure to nonionizing radiation: Secondary | ICD-10-CM | POA: Diagnosis not present

## 2023-05-31 DIAGNOSIS — D229 Melanocytic nevi, unspecified: Secondary | ICD-10-CM | POA: Diagnosis not present

## 2023-05-31 DIAGNOSIS — D2361 Other benign neoplasm of skin of right upper limb, including shoulder: Secondary | ICD-10-CM | POA: Diagnosis not present

## 2023-05-31 DIAGNOSIS — D485 Neoplasm of uncertain behavior of skin: Secondary | ICD-10-CM | POA: Diagnosis not present

## 2023-06-06 DIAGNOSIS — H2511 Age-related nuclear cataract, right eye: Secondary | ICD-10-CM | POA: Diagnosis not present

## 2023-06-06 DIAGNOSIS — H25011 Cortical age-related cataract, right eye: Secondary | ICD-10-CM | POA: Diagnosis not present

## 2023-06-06 DIAGNOSIS — Z961 Presence of intraocular lens: Secondary | ICD-10-CM | POA: Diagnosis not present

## 2023-06-06 DIAGNOSIS — H25811 Combined forms of age-related cataract, right eye: Secondary | ICD-10-CM | POA: Diagnosis not present

## 2023-06-14 ENCOUNTER — Inpatient Hospital Stay: Payer: Medicare PPO | Attending: Obstetrics & Gynecology | Admitting: Obstetrics & Gynecology

## 2023-06-14 ENCOUNTER — Encounter: Payer: Self-pay | Admitting: Obstetrics & Gynecology

## 2023-06-14 VITALS — BP 123/79 | HR 89 | Temp 98.5°F | Resp 14 | Wt 126.4 lb

## 2023-06-14 DIAGNOSIS — Z90722 Acquired absence of ovaries, bilateral: Secondary | ICD-10-CM | POA: Insufficient documentation

## 2023-06-14 DIAGNOSIS — Z9079 Acquired absence of other genital organ(s): Secondary | ICD-10-CM | POA: Diagnosis not present

## 2023-06-14 DIAGNOSIS — Z90710 Acquired absence of both cervix and uterus: Secondary | ICD-10-CM | POA: Insufficient documentation

## 2023-06-14 DIAGNOSIS — C541 Malignant neoplasm of endometrium: Secondary | ICD-10-CM

## 2023-06-14 DIAGNOSIS — Z8542 Personal history of malignant neoplasm of other parts of uterus: Secondary | ICD-10-CM | POA: Diagnosis not present
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
## 2023-06-14 NOTE — Assessment & Plan Note (Signed)
73 year old with a history of stage Ia grade 1 endometrial cancer (MMRI normal/preserved).  Status post staging inDecember 2021 Negative symptom review, normal exam.  No evidence of recurrence   > Continue follow-up every 6 months for 5 years in accordance with NCCN guideline

## 2023-06-14 NOTE — Patient Instructions (Signed)
Return in 1 year ?

## 2023-07-18 ENCOUNTER — Encounter: Payer: Self-pay | Admitting: Obstetrics and Gynecology

## 2023-09-04 DIAGNOSIS — I422 Other hypertrophic cardiomyopathy: Secondary | ICD-10-CM | POA: Diagnosis not present

## 2023-09-04 DIAGNOSIS — Z853 Personal history of malignant neoplasm of breast: Secondary | ICD-10-CM | POA: Diagnosis not present

## 2023-09-04 DIAGNOSIS — M810 Age-related osteoporosis without current pathological fracture: Secondary | ICD-10-CM | POA: Diagnosis not present

## 2023-09-04 DIAGNOSIS — I10 Essential (primary) hypertension: Secondary | ICD-10-CM | POA: Diagnosis not present

## 2023-09-04 DIAGNOSIS — E782 Mixed hyperlipidemia: Secondary | ICD-10-CM | POA: Diagnosis not present

## 2023-09-04 DIAGNOSIS — Z0000 Encounter for general adult medical examination without abnormal findings: Secondary | ICD-10-CM | POA: Diagnosis not present

## 2023-09-04 DIAGNOSIS — R7303 Prediabetes: Secondary | ICD-10-CM | POA: Diagnosis not present

## 2023-09-04 DIAGNOSIS — Z1331 Encounter for screening for depression: Secondary | ICD-10-CM | POA: Diagnosis not present

## 2023-09-04 DIAGNOSIS — F4320 Adjustment disorder, unspecified: Secondary | ICD-10-CM | POA: Diagnosis not present

## 2023-12-07 DIAGNOSIS — Z01419 Encounter for gynecological examination (general) (routine) without abnormal findings: Secondary | ICD-10-CM | POA: Diagnosis not present

## 2023-12-07 DIAGNOSIS — N958 Other specified menopausal and perimenopausal disorders: Secondary | ICD-10-CM | POA: Diagnosis not present

## 2023-12-07 DIAGNOSIS — Z681 Body mass index (BMI) 19 or less, adult: Secondary | ICD-10-CM | POA: Diagnosis not present

## 2023-12-07 DIAGNOSIS — M816 Localized osteoporosis [Lequesne]: Secondary | ICD-10-CM | POA: Diagnosis not present

## 2023-12-07 LAB — HM DEXA SCAN

## 2023-12-28 ENCOUNTER — Ambulatory Visit: Payer: Medicare PPO | Admitting: Hematology and Oncology

## 2024-01-03 ENCOUNTER — Encounter: Payer: Self-pay | Admitting: Adult Health

## 2024-01-03 ENCOUNTER — Other Ambulatory Visit: Payer: Self-pay | Admitting: *Deleted

## 2024-01-03 ENCOUNTER — Inpatient Hospital Stay

## 2024-01-03 ENCOUNTER — Inpatient Hospital Stay: Payer: Medicare PPO | Attending: Adult Health | Admitting: Adult Health

## 2024-01-03 VITALS — BP 101/69 | HR 96 | Temp 98.1°F | Resp 16 | Wt 124.1 lb

## 2024-01-03 DIAGNOSIS — Z90710 Acquired absence of both cervix and uterus: Secondary | ICD-10-CM | POA: Insufficient documentation

## 2024-01-03 DIAGNOSIS — I10 Essential (primary) hypertension: Secondary | ICD-10-CM | POA: Diagnosis not present

## 2024-01-03 DIAGNOSIS — Z170 Estrogen receptor positive status [ER+]: Secondary | ICD-10-CM | POA: Insufficient documentation

## 2024-01-03 DIAGNOSIS — C50211 Malignant neoplasm of upper-inner quadrant of right female breast: Secondary | ICD-10-CM

## 2024-01-03 DIAGNOSIS — I7121 Aneurysm of the ascending aorta, without rupture: Secondary | ICD-10-CM | POA: Diagnosis not present

## 2024-01-03 DIAGNOSIS — Z87440 Personal history of urinary (tract) infections: Secondary | ICD-10-CM | POA: Insufficient documentation

## 2024-01-03 DIAGNOSIS — I422 Other hypertrophic cardiomyopathy: Secondary | ICD-10-CM | POA: Diagnosis not present

## 2024-01-03 DIAGNOSIS — Z8679 Personal history of other diseases of the circulatory system: Secondary | ICD-10-CM | POA: Diagnosis not present

## 2024-01-03 DIAGNOSIS — K760 Fatty (change of) liver, not elsewhere classified: Secondary | ICD-10-CM | POA: Diagnosis not present

## 2024-01-03 DIAGNOSIS — I2510 Atherosclerotic heart disease of native coronary artery without angina pectoris: Secondary | ICD-10-CM | POA: Insufficient documentation

## 2024-01-03 DIAGNOSIS — Z90722 Acquired absence of ovaries, bilateral: Secondary | ICD-10-CM | POA: Diagnosis not present

## 2024-01-03 DIAGNOSIS — E559 Vitamin D deficiency, unspecified: Secondary | ICD-10-CM | POA: Insufficient documentation

## 2024-01-03 DIAGNOSIS — Z860100 Personal history of colon polyps, unspecified: Secondary | ICD-10-CM | POA: Insufficient documentation

## 2024-01-03 DIAGNOSIS — Z923 Personal history of irradiation: Secondary | ICD-10-CM | POA: Diagnosis not present

## 2024-01-03 DIAGNOSIS — M810 Age-related osteoporosis without current pathological fracture: Secondary | ICD-10-CM | POA: Insufficient documentation

## 2024-01-03 DIAGNOSIS — E785 Hyperlipidemia, unspecified: Secondary | ICD-10-CM | POA: Insufficient documentation

## 2024-01-03 DIAGNOSIS — Z9221 Personal history of antineoplastic chemotherapy: Secondary | ICD-10-CM | POA: Insufficient documentation

## 2024-01-03 LAB — CMP (CANCER CENTER ONLY)
ALT: 14 U/L (ref 0–44)
AST: 18 U/L (ref 15–41)
Albumin: 4.4 g/dL (ref 3.5–5.0)
Alkaline Phosphatase: 67 U/L (ref 38–126)
Anion gap: 5 (ref 5–15)
BUN: 37 mg/dL — ABNORMAL HIGH (ref 8–23)
CO2: 32 mmol/L (ref 22–32)
Calcium: 9.1 mg/dL (ref 8.9–10.3)
Chloride: 104 mmol/L (ref 98–111)
Creatinine: 0.69 mg/dL (ref 0.44–1.00)
GFR, Estimated: >60 mL/min (ref >60)
Glucose, Bld: 82 mg/dL (ref 70–99)
Potassium: 4.5 mmol/L (ref 3.5–5.1)
Sodium: 141 mmol/L (ref 135–145)
Total Bilirubin: 0.9 mg/dL (ref 0.0–1.2)
Total Protein: 7.6 g/dL (ref 6.5–8.1)

## 2024-01-03 LAB — CBC WITH DIFFERENTIAL (CANCER CENTER ONLY)
Abs Immature Granulocytes: 0.02 K/uL (ref 0.00–0.07)
Basophils Absolute: 0 K/uL (ref 0.0–0.1)
Basophils Relative: 1 %
Eosinophils Absolute: 0 K/uL (ref 0.0–0.5)
Eosinophils Relative: 0 %
HCT: 43.4 % (ref 36.0–46.0)
Hemoglobin: 14.7 g/dL (ref 12.0–15.0)
Immature Granulocytes: 0 %
Lymphocytes Relative: 10 %
Lymphs Abs: 0.8 K/uL (ref 0.7–4.0)
MCH: 31.7 pg (ref 26.0–34.0)
MCHC: 33.9 g/dL (ref 30.0–36.0)
MCV: 93.7 fL (ref 80.0–100.0)
Monocytes Absolute: 0.4 K/uL (ref 0.1–1.0)
Monocytes Relative: 5 %
Neutro Abs: 6.8 K/uL (ref 1.7–7.7)
Neutrophils Relative %: 84 %
Platelet Count: 267 K/uL (ref 150–400)
RBC: 4.63 MIL/uL (ref 3.87–5.11)
RDW: 13.1 % (ref 11.5–15.5)
WBC Count: 8.1 K/uL (ref 4.0–10.5)
nRBC: 0 % (ref 0.0–0.2)

## 2024-01-03 LAB — VITAMIN D 25 HYDROXY (VIT D DEFICIENCY, FRACTURES): Vit D, 25-Hydroxy: 55.4 ng/mL (ref 30–100)
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
## 2024-01-04 ENCOUNTER — Ambulatory Visit: Payer: Self-pay | Admitting: *Deleted

## 2024-01-05 ENCOUNTER — Other Ambulatory Visit: Payer: Self-pay | Admitting: Family Medicine

## 2024-01-05 DIAGNOSIS — Z1231 Encounter for screening mammogram for malignant neoplasm of breast: Secondary | ICD-10-CM

## 2024-01-16 DIAGNOSIS — H2512 Age-related nuclear cataract, left eye: Secondary | ICD-10-CM | POA: Diagnosis not present

## 2024-01-16 DIAGNOSIS — H25812 Combined forms of age-related cataract, left eye: Secondary | ICD-10-CM | POA: Diagnosis not present

## 2024-01-16 DIAGNOSIS — Z961 Presence of intraocular lens: Secondary | ICD-10-CM | POA: Diagnosis not present

## 2024-01-16 DIAGNOSIS — H25012 Cortical age-related cataract, left eye: Secondary | ICD-10-CM | POA: Diagnosis not present

## 2024-01-18 ENCOUNTER — Encounter: Payer: Self-pay | Admitting: Obstetrics and Gynecology

## 2024-01-29 NOTE — Progress Notes (Unsigned)
   LILLETTE Ileana Collet, PhD, LAT, ATC acting as a scribe for Artist Lloyd, MD.  Christina Daniel is a 73 y.o. female who presents to [facility] today for osteoporosis management. Family hx of lymphoma. Family hx osteoporosis, father.  DEXA scan (date, T-score): 12/07/23: Spine= -2.8, L-FN= -2.3, R-FN= -1.8 Prior treatment: none History of Hip, Spine, or Wrist Fx: yes- L ulna, slipped on a puddle of water  Heart disease or stroke: hypertrophic cardiomyopathy Cancer: yes: breast, 2013 Kidney Disease: no Gastric/Peptic Ulcer: no Gastric bypass surgery: no Severe GERD: no Hx of seizures: no Age at Menopause: 73y/o Calcium  intake: yes- 1200mg  Vitamin D  intake: yes Hormone replacement therapy: no Smoking history: never smoked Alcohol: none Exercise: yes 5-7 x wk, walk 5 miles, weight training w/ a video, senior resource center Major dental work in past year: no Parents with hip/spine fracture: non Height loss: 67 to 66.5   Pertinent review of systems: No fevers or chills  Relevant historical information: History of a wrist fracture after a fall a few years ago.   Exam:  BP 122/78   Pulse 86   Ht 5' 6 (1.676 m)   Wt 123 lb (55.8 kg)   SpO2 98%   BMI 19.85 kg/m  General: Well Developed, well nourished, and in no acute distress.   MSK: Normal gait normal motion    Lab and Radiology Results No results found for this or any previous visit (from the past 72 hours). No results found.     Assessment and Plan: 73 y.o. female with osteoporosis.  Since learning of her diagnosis she started a weightlifting resistance training program in addition to her already existing walking program.  She continues calcium  and vitamin D .  She is reluctant to consider medications.  At this point we talked about options.  I stressed the importance of resistance training and continued weightbearing exercise.  Continue calcium  and vitamin D .  Reviewed medication history  and consider Prolia.  She already has had a course of Fosamax .  She will let me know if she would like to consider Prolia.   PDMP not reviewed this encounter. No orders of the defined types were placed in this encounter.  No orders of the defined types were placed in this encounter.    Discussed warning signs or symptoms. Please see discharge instructions. Patient expresses understanding.   The above documentation has been reviewed and is accurate and complete Artist Lloyd, M.D.

## 2024-01-30 ENCOUNTER — Ambulatory Visit: Admitting: Family Medicine

## 2024-01-30 VITALS — BP 122/78 | HR 86 | Ht 66.0 in | Wt 123.0 lb

## 2024-01-30 DIAGNOSIS — Z87310 Personal history of (healed) osteoporosis fracture: Secondary | ICD-10-CM | POA: Diagnosis not present

## 2024-01-30 DIAGNOSIS — M818 Other osteoporosis without current pathological fracture: Secondary | ICD-10-CM

## 2024-01-30 NOTE — Patient Instructions (Addendum)
 Thank you for coming in today.   Continue resistance training.   Consider Prolia.   Repeat Bone Density (DEXA) in 2 years.   See you back in 1 year for Osteoporosis follow-up, sooner if needed.

## 2024-02-02 ENCOUNTER — Encounter (INDEPENDENT_AMBULATORY_CARE_PROVIDER_SITE_OTHER): Payer: Self-pay | Admitting: Internal Medicine

## 2024-02-02 DIAGNOSIS — I422 Other hypertrophic cardiomyopathy: Secondary | ICD-10-CM

## 2024-02-02 NOTE — Telephone Encounter (Signed)
 Called about supplements; HTN and HCM- green tea may have a benefit.  Drinking in moderation should be fine.   Please see the MyChart message reply(ies) for my assessment and plan.    This patient gave consent for this Medical Advice Message and is aware that it may result in a bill to [employer], as well as the possibility of receiving a bill for a co-payment or deductible. They are an established patient, but are not seeking medical advice exclusively about a problem treated during an in person or video visit in the last seven days. I did not recommend an in person or video visit within seven days of my reply.    I spent a total of 6 minutes cumulative time within 7 days through [employer].  Stanly DELENA Leavens, MD

## 2024-02-19 ENCOUNTER — Inpatient Hospital Stay: Attending: Adult Health

## 2024-02-19 DIAGNOSIS — C50211 Malignant neoplasm of upper-inner quadrant of right female breast: Secondary | ICD-10-CM

## 2024-02-19 DIAGNOSIS — E559 Vitamin D deficiency, unspecified: Secondary | ICD-10-CM | POA: Insufficient documentation

## 2024-02-19 DIAGNOSIS — Z923 Personal history of irradiation: Secondary | ICD-10-CM | POA: Diagnosis not present

## 2024-02-19 DIAGNOSIS — M810 Age-related osteoporosis without current pathological fracture: Secondary | ICD-10-CM | POA: Diagnosis not present

## 2024-02-19 DIAGNOSIS — Z9221 Personal history of antineoplastic chemotherapy: Secondary | ICD-10-CM | POA: Insufficient documentation

## 2024-02-19 DIAGNOSIS — Z853 Personal history of malignant neoplasm of breast: Secondary | ICD-10-CM | POA: Diagnosis not present

## 2024-02-19 LAB — CBC WITH DIFFERENTIAL (CANCER CENTER ONLY)
Abs Immature Granulocytes: 0.02 K/uL (ref 0.00–0.07)
Basophils Absolute: 0 K/uL (ref 0.0–0.1)
Basophils Relative: 1 %
Eosinophils Absolute: 0 K/uL (ref 0.0–0.5)
Eosinophils Relative: 0 %
HCT: 42 % (ref 36.0–46.0)
Hemoglobin: 14.3 g/dL (ref 12.0–15.0)
Immature Granulocytes: 0 %
Lymphocytes Relative: 19 %
Lymphs Abs: 1 K/uL (ref 0.7–4.0)
MCH: 31.6 pg (ref 26.0–34.0)
MCHC: 34 g/dL (ref 30.0–36.0)
MCV: 92.7 fL (ref 80.0–100.0)
Monocytes Absolute: 0.3 K/uL (ref 0.1–1.0)
Monocytes Relative: 6 %
Neutro Abs: 3.9 K/uL (ref 1.7–7.7)
Neutrophils Relative %: 74 %
Platelet Count: 252 K/uL (ref 150–400)
RBC: 4.53 MIL/uL (ref 3.87–5.11)
RDW: 13.2 % (ref 11.5–15.5)
WBC Count: 5.2 K/uL (ref 4.0–10.5)
nRBC: 0 % (ref 0.0–0.2)

## 2024-02-19 LAB — CMP (CANCER CENTER ONLY)
ALT: 20 U/L (ref 0–44)
AST: 26 U/L (ref 15–41)
Albumin: 4.5 g/dL (ref 3.5–5.0)
Alkaline Phosphatase: 60 U/L (ref 38–126)
Anion gap: 6 (ref 5–15)
BUN: 18 mg/dL (ref 8–23)
CO2: 30 mmol/L (ref 22–32)
Calcium: 9.6 mg/dL (ref 8.9–10.3)
Chloride: 103 mmol/L (ref 98–111)
Creatinine: 0.57 mg/dL (ref 0.44–1.00)
GFR, Estimated: >60 mL/min (ref >60)
Glucose, Bld: 126 mg/dL — ABNORMAL HIGH (ref 70–99)
Potassium: 3.9 mmol/L (ref 3.5–5.1)
Sodium: 139 mmol/L (ref 135–145)
Total Bilirubin: 1.4 mg/dL — ABNORMAL HIGH (ref 0.0–1.2)
Total Protein: 7.4 g/dL (ref 6.5–8.1)

## 2024-03-06 ENCOUNTER — Encounter: Payer: Self-pay | Admitting: Internal Medicine

## 2024-03-06 ENCOUNTER — Other Ambulatory Visit: Payer: Self-pay

## 2024-03-06 ENCOUNTER — Ambulatory Visit: Attending: Cardiology | Admitting: Internal Medicine

## 2024-03-06 VITALS — BP 123/76 | HR 74 | Resp 16 | Ht 66.0 in | Wt 119.0 lb

## 2024-03-06 DIAGNOSIS — E782 Mixed hyperlipidemia: Secondary | ICD-10-CM | POA: Diagnosis not present

## 2024-03-06 DIAGNOSIS — I2510 Atherosclerotic heart disease of native coronary artery without angina pectoris: Secondary | ICD-10-CM | POA: Diagnosis not present

## 2024-03-06 DIAGNOSIS — I422 Other hypertrophic cardiomyopathy: Secondary | ICD-10-CM

## 2024-03-06 DIAGNOSIS — Z0000 Encounter for general adult medical examination without abnormal findings: Secondary | ICD-10-CM | POA: Diagnosis not present

## 2024-03-06 DIAGNOSIS — M810 Age-related osteoporosis without current pathological fracture: Secondary | ICD-10-CM | POA: Diagnosis not present

## 2024-03-06 DIAGNOSIS — I10 Essential (primary) hypertension: Secondary | ICD-10-CM

## 2024-03-06 LAB — COMPREHENSIVE METABOLIC PANEL WITH GFR
ALT: 18 IU/L (ref 0–32)
AST: 20 IU/L (ref 0–40)
Albumin: 4.7 g/dL (ref 3.8–4.8)
Alkaline Phosphatase: 78 IU/L (ref 49–135)
BUN/Creatinine Ratio: 26 (ref 12–28)
BUN: 16 mg/dL (ref 8–27)
Bilirubin Total: 1.3 mg/dL — ABNORMAL HIGH (ref 0.0–1.2)
CO2: 26 mmol/L (ref 20–29)
Calcium: 10.2 mg/dL (ref 8.7–10.3)
Chloride: 101 mmol/L (ref 96–106)
Creatinine, Ser: 0.61 mg/dL (ref 0.57–1.00)
Globulin, Total: 2.4 g/dL (ref 1.5–4.5)
Glucose: 88 mg/dL (ref 70–99)
Potassium: 4.5 mmol/L (ref 3.5–5.2)
Sodium: 142 mmol/L (ref 134–144)
Total Protein: 7.1 g/dL (ref 6.0–8.5)
eGFR: 93 mL/min/1.73

## 2024-03-06 LAB — LIPID PANEL
Chol/HDL Ratio: 3.1 ratio (ref 0.0–4.4)
Cholesterol, Total: 171 mg/dL (ref 100–199)
HDL: 56 mg/dL
LDL Chol Calc (NIH): 80 mg/dL (ref 0–99)
Triglycerides: 212 mg/dL — ABNORMAL HIGH (ref 0–149)
VLDL Cholesterol Cal: 35 mg/dL (ref 5–40)
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
## 2024-03-06 NOTE — Patient Instructions (Signed)
 Medication Instructions:  Your physician recommends that you continue on your current medications as directed. Please refer to the Current Medication list given to you today.  *If you need a refill on your cardiac medications before your next appointment, please call your pharmacy*  Lab Work: TODAY: Lipids and CMET  Testing/Procedures: Echocardiogram  Your physician has requested that you have an echocardiogram. Echocardiography is a painless test that uses sound waves to create images of your heart. It provides your doctor with information about the size and shape of your heart and how well your heart's chambers and valves are working. This procedure takes approximately one hour. There are no restrictions for this procedure. Please do NOT wear cologne, perfume, aftershave, or lotions (deodorant is allowed). Please arrive 15 minutes prior to your appointment time.  Please note: We ask at that you not bring children with you during ultrasound (echo/ vascular) testing. Due to room size and safety concerns, children are not allowed in the ultrasound rooms during exams. Our front office staff cannot provide observation of children in our lobby area while testing is being conducted. An adult accompanying a patient to their appointment will only be allowed in the ultrasound room at the discretion of the ultrasound technician under special circumstances. We apologize for any inconvenience.   Follow-Up: At [facility], you and your health needs are our priority.  As part of our continuing mission to provide you with exceptional heart care, our providers are all part of one team.  This team includes your primary Cardiologist (physician) and Advanced Practice Providers or APPs (Physician Assistants and Nurse Practitioners) who all work together to provide you with the care you need, when you need it.  Your next appointment:   1 year  Provider:   Stanly DELENA Leavens, MD

## 2024-03-07 ENCOUNTER — Encounter: Payer: Self-pay | Admitting: Internal Medicine

## 2024-03-07 DIAGNOSIS — Z79899 Other long term (current) drug therapy: Secondary | ICD-10-CM

## 2024-03-07 DIAGNOSIS — I2510 Atherosclerotic heart disease of native coronary artery without angina pectoris: Secondary | ICD-10-CM

## 2024-03-08 ENCOUNTER — Ambulatory Visit: Payer: Self-pay | Admitting: *Deleted

## 2024-03-13 ENCOUNTER — Other Ambulatory Visit: Payer: Self-pay | Admitting: *Deleted

## 2024-03-13 DIAGNOSIS — Z79899 Other long term (current) drug therapy: Secondary | ICD-10-CM

## 2024-03-13 DIAGNOSIS — I2510 Atherosclerotic heart disease of native coronary artery without angina pectoris: Secondary | ICD-10-CM

## 2024-03-17 ENCOUNTER — Encounter: Payer: Self-pay | Admitting: Family Medicine

## 2024-04-16 ENCOUNTER — Ambulatory Visit (HOSPITAL_COMMUNITY)

## 2024-04-16 ENCOUNTER — Ambulatory Visit (HOSPITAL_COMMUNITY)
Admission: RE | Admit: 2024-04-16 | Discharge: 2024-04-16 | Disposition: A | Source: Ambulatory Visit | Attending: Internal Medicine

## 2024-04-16 DIAGNOSIS — I422 Other hypertrophic cardiomyopathy: Secondary | ICD-10-CM

## 2024-04-16 DIAGNOSIS — I2510 Atherosclerotic heart disease of native coronary artery without angina pectoris: Secondary | ICD-10-CM

## 2024-04-16 DIAGNOSIS — I10 Essential (primary) hypertension: Secondary | ICD-10-CM | POA: Diagnosis not present

## 2024-04-16 LAB — ECHOCARDIOGRAM COMPLETE
Area-P 1/2: 3.39 cm2
S' Lateral: 3.13 cm

## 2024-04-16 MED ORDER — PERFLUTREN LIPID MICROSPHERE
1.0000 mL | INTRAVENOUS | Status: AC | PRN
  Administered 2024-04-16: 14:00:00 2 mL via INTRAVENOUS

## 2024-04-18 ENCOUNTER — Telehealth: Payer: Self-pay | Admitting: *Deleted

## 2024-04-18 NOTE — Telephone Encounter (Signed)
 Patient called and scheduled a follow up appt with Dr Rogelio on 2/11

## 2024-04-30 ENCOUNTER — Ambulatory Visit

## 2024-05-09 ENCOUNTER — Ambulatory Visit
Admission: RE | Admit: 2024-05-09 | Discharge: 2024-05-09 | Disposition: A | Discharge status: Home / Self Care | Source: Ambulatory Visit | Attending: Family Medicine | Admitting: Family Medicine

## 2024-05-09 DIAGNOSIS — Z1231 Encounter for screening mammogram for malignant neoplasm of breast: Secondary | ICD-10-CM

## 2024-05-12 ENCOUNTER — Encounter: Payer: Self-pay | Admitting: Internal Medicine

## 2024-05-12 DIAGNOSIS — Z79899 Other long term (current) drug therapy: Secondary | ICD-10-CM

## 2024-05-12 DIAGNOSIS — I2510 Atherosclerotic heart disease of native coronary artery without angina pectoris: Secondary | ICD-10-CM

## 2024-05-23 LAB — LIPID PANEL
Chol/HDL Ratio: 3.1 ratio (ref 0.0–4.4)
Cholesterol, Total: 173 mg/dL (ref 100–199)
HDL: 55 mg/dL
LDL Chol Calc (NIH): 95 mg/dL (ref 0–99)
Triglycerides: 128 mg/dL (ref 0–149)
VLDL Cholesterol Cal: 23 mg/dL (ref 5–40)

## 2024-05-24 ENCOUNTER — Ambulatory Visit: Payer: Self-pay

## 2024-05-24 NOTE — Telephone Encounter (Signed)
 Pt called to follow up please advise.

## 2024-05-29 NOTE — Addendum Note (Signed)
 Addended by: RANDY HAMP SAILOR on: 05/29/2024 08:41 AM   Modules accepted: Orders

## 2024-05-31 ENCOUNTER — Inpatient Hospital Stay: Attending: Obstetrics & Gynecology | Admitting: Obstetrics & Gynecology

## 2024-05-31 DIAGNOSIS — C541 Malignant neoplasm of endometrium: Secondary | ICD-10-CM

## 2024-06-12 ENCOUNTER — Inpatient Hospital Stay: Admitting: Obstetrics & Gynecology

## 2024-07-10 ENCOUNTER — Inpatient Hospital Stay: Admitting: Obstetrics & Gynecology

## 2025-01-02 ENCOUNTER — Other Ambulatory Visit

## 2025-01-02 ENCOUNTER — Ambulatory Visit: Admitting: Adult Health
